# Patient Record
Sex: Female | Born: 1935 | Race: Black or African American | Hispanic: No | Marital: Married | Smoking: Former smoker
Health system: Southern US, Community
[De-identification: ages and names within clinical notes are randomized; demographics above are authoritative.]

## PROBLEM LIST (undated history)

## (undated) DIAGNOSIS — E876 Hypokalemia: Secondary | ICD-10-CM

## (undated) DIAGNOSIS — E785 Hyperlipidemia, unspecified: Secondary | ICD-10-CM

## (undated) DIAGNOSIS — M109 Gout, unspecified: Secondary | ICD-10-CM

## (undated) DIAGNOSIS — G4700 Insomnia, unspecified: Principal | ICD-10-CM

## (undated) DIAGNOSIS — E559 Vitamin D deficiency, unspecified: Secondary | ICD-10-CM

## (undated) DIAGNOSIS — R0981 Nasal congestion: Secondary | ICD-10-CM

## (undated) DIAGNOSIS — R059 Cough, unspecified: Secondary | ICD-10-CM

## (undated) DIAGNOSIS — D649 Anemia, unspecified: Principal | ICD-10-CM

## (undated) DIAGNOSIS — Z923 Personal history of irradiation: Secondary | ICD-10-CM

## (undated) DIAGNOSIS — J019 Acute sinusitis, unspecified: Secondary | ICD-10-CM

## (undated) DIAGNOSIS — I10 Essential (primary) hypertension: Secondary | ICD-10-CM

## (undated) DIAGNOSIS — K120 Recurrent oral aphthae: Secondary | ICD-10-CM

## (undated) DIAGNOSIS — R599 Enlarged lymph nodes, unspecified: Secondary | ICD-10-CM

## (undated) DIAGNOSIS — E079 Disorder of thyroid, unspecified: Secondary | ICD-10-CM

## (undated) DIAGNOSIS — C410 Malignant neoplasm of bones of skull and face: Secondary | ICD-10-CM

## (undated) DIAGNOSIS — C030 Malignant neoplasm of upper gum: Secondary | ICD-10-CM

## (undated) DIAGNOSIS — H409 Unspecified glaucoma: Secondary | ICD-10-CM

## (undated) DIAGNOSIS — R05 Cough: Secondary | ICD-10-CM

## (undated) DIAGNOSIS — H402290 Chronic angle-closure glaucoma, unspecified eye, stage unspecified: Secondary | ICD-10-CM

## (undated) DIAGNOSIS — K5900 Constipation, unspecified: Secondary | ICD-10-CM

## (undated) DIAGNOSIS — C50919 Malignant neoplasm of unspecified site of unspecified female breast: Secondary | ICD-10-CM

## (undated) DIAGNOSIS — E440 Moderate protein-calorie malnutrition: Secondary | ICD-10-CM

## (undated) DIAGNOSIS — D630 Anemia in neoplastic disease: Secondary | ICD-10-CM

## (undated) DIAGNOSIS — M1710 Unilateral primary osteoarthritis, unspecified knee: Secondary | ICD-10-CM

## (undated) DIAGNOSIS — E875 Hyperkalemia: Secondary | ICD-10-CM

## (undated) DIAGNOSIS — B370 Candidal stomatitis: Principal | ICD-10-CM

## (undated) DIAGNOSIS — T8149XA Infection following a procedure, other surgical site, initial encounter: Secondary | ICD-10-CM

## (undated) DIAGNOSIS — E781 Pure hyperglyceridemia: Secondary | ICD-10-CM

## (undated) DIAGNOSIS — C50911 Malignant neoplasm of unspecified site of right female breast: Secondary | ICD-10-CM

## (undated) DIAGNOSIS — Z79811 Long term (current) use of aromatase inhibitors: Secondary | ICD-10-CM

## (undated) DIAGNOSIS — Z931 Gastrostomy status: Secondary | ICD-10-CM

## (undated) DIAGNOSIS — IMO0002 Reserved for concepts with insufficient information to code with codable children: Secondary | ICD-10-CM

## (undated) DIAGNOSIS — R7989 Other specified abnormal findings of blood chemistry: Secondary | ICD-10-CM

## (undated) DIAGNOSIS — J301 Allergic rhinitis due to pollen: Secondary | ICD-10-CM

## (undated) HISTORY — DX: Essential (primary) hypertension: I10

## (undated) HISTORY — DX: Nasal congestion: R09.81

## (undated) HISTORY — DX: Constipation, unspecified: K59.00

## (undated) HISTORY — PX: OTHER SURGICAL HISTORY: SHX169

## (undated) HISTORY — DX: Anemia in neoplastic disease: D63.0

## (undated) HISTORY — DX: Pure hyperglyceridemia: E78.1

## (undated) HISTORY — DX: Enlarged lymph nodes, unspecified: R59.9

## (undated) HISTORY — DX: Chronic angle-closure glaucoma, unspecified eye, stage unspecified: H40.2290

## (undated) HISTORY — DX: Insomnia, unspecified: G47.00

## (undated) HISTORY — DX: Allergic rhinitis due to pollen: J30.1

## (undated) HISTORY — DX: Unilateral primary osteoarthritis, unspecified knee: M17.10

## (undated) HISTORY — DX: Gout, unspecified: M10.9

## (undated) HISTORY — DX: Vitamin D deficiency, unspecified: E55.9

## (undated) HISTORY — DX: Other specified abnormal findings of blood chemistry: R79.89

## (undated) HISTORY — DX: Hypokalemia: E87.6

## (undated) HISTORY — DX: Cough: R05

## (undated) HISTORY — DX: Candidal stomatitis: B37.0

## (undated) HISTORY — DX: Malignant neoplasm of unspecified site of unspecified female breast: C50.919

## (undated) HISTORY — DX: Disorder of thyroid, unspecified: E07.9

## (undated) HISTORY — DX: Hyperlipidemia, unspecified: E78.5

## (undated) HISTORY — DX: Recurrent oral aphthae: K12.0

## (undated) HISTORY — DX: Reserved for concepts with insufficient information to code with codable children: IMO0002

## (undated) HISTORY — DX: Unspecified glaucoma: H40.9

## (undated) HISTORY — DX: Moderate protein-calorie malnutrition: E44.0

## (undated) HISTORY — DX: Acute sinusitis, unspecified: J01.90

## (undated) HISTORY — DX: Hyperkalemia: E87.5

## (undated) HISTORY — DX: Anemia, unspecified: D64.9

## (undated) HISTORY — PX: BREAST SURGERY: SHX581

## (undated) HISTORY — DX: Cough, unspecified: R05.9

---

## 1958-09-20 HISTORY — PX: ABDOMINAL HYSTERECTOMY: SHX81

## 1998-09-18 ENCOUNTER — Other Ambulatory Visit: Admission: RE | Admit: 1998-09-18 | Discharge: 1998-09-18 | Payer: Self-pay | Admitting: Family Medicine

## 1999-12-19 ENCOUNTER — Encounter: Admission: RE | Admit: 1999-12-19 | Discharge: 1999-12-19 | Payer: Self-pay | Admitting: Family Medicine

## 1999-12-19 ENCOUNTER — Encounter: Payer: Self-pay | Admitting: Family Medicine

## 2001-02-03 ENCOUNTER — Other Ambulatory Visit: Admission: RE | Admit: 2001-02-03 | Discharge: 2001-02-03 | Payer: Self-pay | Admitting: Family Medicine

## 2001-06-28 ENCOUNTER — Encounter (INDEPENDENT_AMBULATORY_CARE_PROVIDER_SITE_OTHER): Payer: Self-pay | Admitting: Specialist

## 2001-06-28 ENCOUNTER — Ambulatory Visit (HOSPITAL_COMMUNITY): Admission: RE | Admit: 2001-06-28 | Discharge: 2001-06-28 | Payer: Self-pay | Admitting: Gastroenterology

## 2002-04-24 ENCOUNTER — Encounter: Payer: Self-pay | Admitting: Family Medicine

## 2002-04-24 ENCOUNTER — Encounter: Admission: RE | Admit: 2002-04-24 | Discharge: 2002-04-24 | Payer: Self-pay | Admitting: Family Medicine

## 2002-05-02 ENCOUNTER — Encounter: Admission: RE | Admit: 2002-05-02 | Discharge: 2002-07-31 | Payer: Self-pay | Admitting: Family Medicine

## 2003-01-03 ENCOUNTER — Encounter: Admission: RE | Admit: 2003-01-03 | Discharge: 2003-01-04 | Payer: Self-pay | Admitting: Family Medicine

## 2003-07-04 ENCOUNTER — Other Ambulatory Visit: Admission: RE | Admit: 2003-07-04 | Discharge: 2003-07-04 | Payer: Self-pay | Admitting: Family Medicine

## 2005-06-01 ENCOUNTER — Encounter: Admission: RE | Admit: 2005-06-01 | Discharge: 2005-06-01 | Payer: Self-pay | Admitting: Family Medicine

## 2007-08-05 ENCOUNTER — Encounter: Admission: RE | Admit: 2007-08-05 | Discharge: 2007-08-05 | Payer: Self-pay | Admitting: Family Medicine

## 2009-08-07 ENCOUNTER — Encounter: Admission: RE | Admit: 2009-08-07 | Discharge: 2009-08-07 | Payer: Self-pay | Admitting: Internal Medicine

## 2009-08-19 ENCOUNTER — Encounter: Admission: RE | Admit: 2009-08-19 | Discharge: 2009-08-19 | Payer: Self-pay | Admitting: Internal Medicine

## 2009-08-28 ENCOUNTER — Encounter: Admission: RE | Admit: 2009-08-28 | Discharge: 2009-08-28 | Payer: Self-pay | Admitting: Internal Medicine

## 2009-08-28 DIAGNOSIS — C50911 Malignant neoplasm of unspecified site of right female breast: Secondary | ICD-10-CM

## 2009-08-28 HISTORY — DX: Malignant neoplasm of unspecified site of right female breast: C50.911

## 2009-09-06 ENCOUNTER — Encounter: Admission: RE | Admit: 2009-09-06 | Discharge: 2009-09-06 | Payer: Self-pay | Admitting: Internal Medicine

## 2009-09-10 ENCOUNTER — Encounter: Admission: RE | Admit: 2009-09-10 | Discharge: 2009-09-10 | Payer: Self-pay | Admitting: General Surgery

## 2009-09-11 ENCOUNTER — Ambulatory Visit: Payer: Self-pay | Admitting: Oncology

## 2009-10-09 ENCOUNTER — Ambulatory Visit (HOSPITAL_COMMUNITY): Admission: RE | Admit: 2009-10-09 | Discharge: 2009-10-10 | Payer: Self-pay | Admitting: General Surgery

## 2009-10-09 ENCOUNTER — Encounter (INDEPENDENT_AMBULATORY_CARE_PROVIDER_SITE_OTHER): Payer: Self-pay | Admitting: General Surgery

## 2009-10-09 HISTORY — PX: SIMPLE MASTECTOMY W/ SENTINEL NODE BIOPSY: SHX2410

## 2009-10-21 ENCOUNTER — Ambulatory Visit: Payer: Self-pay | Admitting: Oncology

## 2009-12-26 ENCOUNTER — Ambulatory Visit: Payer: Self-pay | Admitting: Oncology

## 2010-02-09 ENCOUNTER — Encounter: Payer: Self-pay | Admitting: Family Medicine

## 2010-02-09 ENCOUNTER — Encounter: Payer: Self-pay | Admitting: General Surgery

## 2010-02-09 ENCOUNTER — Encounter: Payer: Self-pay | Admitting: Internal Medicine

## 2010-02-10 ENCOUNTER — Encounter: Payer: Self-pay | Admitting: Family Medicine

## 2010-04-03 LAB — CBC
HCT: 37 % (ref 36.0–46.0)
Hemoglobin: 11.8 g/dL — ABNORMAL LOW (ref 12.0–15.0)
MCH: 25.4 pg — ABNORMAL LOW (ref 26.0–34.0)
MCHC: 31.9 g/dL (ref 30.0–36.0)
Platelets: 247 10*3/uL (ref 150–400)
RDW: 17.1 % — ABNORMAL HIGH (ref 11.5–15.5)
WBC: 6.7 10*3/uL (ref 4.0–10.5)

## 2010-04-03 LAB — URINALYSIS, ROUTINE W REFLEX MICROSCOPIC
Bilirubin Urine: NEGATIVE
Ketones, ur: NEGATIVE mg/dL
Protein, ur: 30 mg/dL — AB
Specific Gravity, Urine: 1.013 (ref 1.005–1.030)
Urobilinogen, UA: 0.2 mg/dL (ref 0.0–1.0)
pH: 7 (ref 5.0–8.0)

## 2010-04-03 LAB — COMPREHENSIVE METABOLIC PANEL
ALT: 17 U/L (ref 0–35)
Alkaline Phosphatase: 86 U/L (ref 39–117)
BUN: 6 mg/dL (ref 6–23)
CO2: 29 mEq/L (ref 19–32)
GFR calc Af Amer: >60 mL/min (ref >60)
GFR calc non Af Amer: >60 mL/min (ref >60)
Potassium: 3.8 mEq/L (ref 3.5–5.1)
Sodium: 141 mEq/L (ref 135–145)
Total Bilirubin: 0.7 mg/dL (ref 0.3–1.2)

## 2010-04-03 LAB — DIFFERENTIAL
Lymphocytes Relative: 35 % (ref 12–46)
Lymphs Abs: 2.3 10*3/uL (ref 0.7–4.0)
Neutrophils Relative %: 57 % (ref 43–77)

## 2010-04-03 LAB — URINE MICROSCOPIC-ADD ON

## 2010-04-03 LAB — GLUCOSE, CAPILLARY

## 2010-06-06 NOTE — Procedures (Signed)
. [facility]  Patient:    Kristine Morales, Kristine Morales Visit Number: [id number] MRN: [id number]          Service Type: END Location: ENDO Attending Physician:  Charna Elizabeth Dictated by:   Anselmo Rod, M.D. Proc. Date: 06/28/01 Admit Date:  06/28/2001 Discharge Date: 06/28/2001   CC:         Talmadge Coventry, M.D.   Procedure Report  DATE OF BIRTH:  1931/12/03  REFERRING PHYSICIAN:  Talmadge Coventry, M.D.  PROCEDURE PERFORMED:  Colonoscopy with snare polypectomy x1.  ENDOSCOPIST:  Anselmo Rod, M.D.  INSTRUMENT USED:  Olympus video colonoscope changed to a pediatric scope.  INDICATIONS FOR PROCEDURE:  The patient is a 77 year old African-American female undergoing screening colonoscopy.  The patient has a family history of breast cancer and has had guaiac-positive stools on an physical exam.  PREPROCEDURE PREPARATION:  Informed consent was procured from the patient. The patient was fasted for eight hours prior to the procedure and prepped with a bottle of magnesium citrate and a gallon of NuLytely the night prior to the procedure.  PREPROCEDURE PHYSICAL:  The patient had stable vital signs.  Neck supple. Chest clear to auscultation.  S1, S2 regular.  Abdomen soft with normal bowel sounds.  DESCRIPTION OF PROCEDURE:  The patient was placed in the left lateral decubitus position and sedated with 100 mg of Demerol and 10 mg of Versed intravenously.  Once the patient was adequately sedated and maintained on low-flow oxygen and continuous cardiac monitoring, the Olympus video colonoscope was advanced from the rectum to the cecum with slight difficulty. The scope was looping at about the transverse colon.  Therefore the adult colonoscope was withdrawn and the pediatric colonoscope was used.  The pediatric scope was advanced up to the cecum.  The appendicular orifice and ileocecal valve were clearly visualized and photographed.  A small  polyp was ablated in the midleft colon and a small sessile polyp was snared from 20 cm. No diverticulosis, large masses or polyps were seen.  Small internal hemorrhoids were appreciated on retroflexion in the rectum.  The patient tolerated the procedure well without complications.  IMPRESSION: 1. Small polyp snared at 20 cm. 2. Small polyp ablated at midleft colon. 3. No evidence of diverticulosis. 4. Small nonbleeding internal hemorrhoid.  RECOMMENDATIONS: 1. Await pathology results. 2. Avoid all nonsteroidals for the next two weeks. 3. Outpatient follow-up in the next two weeks for repeat guaiac testing.    Further recommendation made at that time. Dictated by:   Anselmo Rod, M.D. Attending Physician:  Charna Elizabeth DD:  06/28/01 TD:  06/29/01 Job: [id number]

## 2010-08-26 ENCOUNTER — Other Ambulatory Visit: Payer: Self-pay | Admitting: Internal Medicine

## 2010-08-26 DIAGNOSIS — Z1231 Encounter for screening mammogram for malignant neoplasm of breast: Secondary | ICD-10-CM

## 2010-09-03 ENCOUNTER — Ambulatory Visit: Payer: Self-pay

## 2010-09-19 ENCOUNTER — Ambulatory Visit
Admission: RE | Admit: 2010-09-19 | Discharge: 2010-09-19 | Disposition: A | Discharge status: Home / Self Care | Payer: Medicare Other | Source: Ambulatory Visit | Attending: Internal Medicine | Admitting: Internal Medicine

## 2010-09-19 DIAGNOSIS — Z1231 Encounter for screening mammogram for malignant neoplasm of breast: Secondary | ICD-10-CM

## 2010-11-17 ENCOUNTER — Encounter (INDEPENDENT_AMBULATORY_CARE_PROVIDER_SITE_OTHER): Payer: Self-pay | Admitting: General Surgery

## 2010-11-24 ENCOUNTER — Other Ambulatory Visit: Payer: Medicare Other | Admitting: Lab

## 2010-11-26 ENCOUNTER — Telehealth: Payer: Self-pay | Admitting: Oncology

## 2010-11-26 NOTE — Telephone Encounter (Signed)
Pt called and cancelled appt on 11/12.  Pt will call in dec to r/s appt to jan2013

## 2010-12-01 ENCOUNTER — Ambulatory Visit: Payer: Medicare Other | Admitting: Oncology

## 2011-02-09 ENCOUNTER — Ambulatory Visit (HOSPITAL_BASED_OUTPATIENT_CLINIC_OR_DEPARTMENT_OTHER): Payer: Medicare Other | Admitting: Oncology

## 2011-02-09 ENCOUNTER — Other Ambulatory Visit: Payer: Medicare Other | Admitting: Lab

## 2011-02-09 VITALS — BP 227/110 | HR 90 | Temp 98.6°F | Ht 66.5 in | Wt 137.8 lb

## 2011-02-09 DIAGNOSIS — Z170 Estrogen receptor positive status [ER+]: Secondary | ICD-10-CM

## 2011-02-09 DIAGNOSIS — Z79811 Long term (current) use of aromatase inhibitors: Secondary | ICD-10-CM

## 2011-02-09 DIAGNOSIS — C50919 Malignant neoplasm of unspecified site of unspecified female breast: Secondary | ICD-10-CM

## 2011-02-09 DIAGNOSIS — Z9010 Acquired absence of unspecified breast and nipple: Secondary | ICD-10-CM

## 2011-02-09 LAB — CBC WITH DIFFERENTIAL/PLATELET
BASO%: 0.4 % (ref 0.0–2.0)
Basophils Absolute: 0 10*3/uL (ref 0.0–0.1)
EOS%: 0.3 % (ref 0.0–7.0)
HGB: 12.1 g/dL (ref 11.6–15.9)
LYMPH%: 37.4 % (ref 14.0–49.7)
MCH: 26.8 pg (ref 25.1–34.0)
MCV: 81.9 fL (ref 79.5–101.0)
MONO#: 0.5 10*3/uL (ref 0.1–0.9)
MONO%: 6.9 % (ref 0.0–14.0)
NEUT#: 3.7 10*3/uL (ref 1.5–6.5)
Platelets: 261 10*3/uL (ref 145–400)
RBC: 4.51 10*6/uL (ref 3.70–5.45)
RDW: 16.7 % — ABNORMAL HIGH (ref 11.2–14.5)
WBC: 6.7 10*3/uL (ref 3.9–10.3)

## 2011-02-09 LAB — COMPREHENSIVE METABOLIC PANEL
BUN: 9 mg/dL (ref 6–23)
CO2: 27 mEq/L (ref 19–32)
Calcium: 10 mg/dL (ref 8.4–10.5)
Chloride: 103 mEq/L (ref 96–112)
Creatinine, Ser: 0.76 mg/dL (ref 0.50–1.10)
Glucose, Bld: 106 mg/dL — ABNORMAL HIGH (ref 70–99)

## 2011-02-09 NOTE — Progress Notes (Signed)
ID: PRESTON WEILL  DOB: 1931-10-01  MR#: [id number]  CSN#: [id number]  HISTORY OF PRESENT ILLNESS:   Ms. Obey had routine screening mammography on July 20th at [facility] showing a possible mass in the right breast.  She was recalled August 1st for additional views and Dr. Deboraha Sprang confirmed the presence of an ill-defined mass in the right lower outer quadrant posteriorly which was palpable.  Ultrasound showed an ill-defined hypoechoic mass measuring 1 cm.  The axilla was unremarkable.  This was felt to be highly suspicious and biopsy was performed August 28, 2009.  This showed [phone]) an invasive ductal carcinoma, low grade, ER 94% and PR 43% positive, Her-2 negative, with an MIB-1 of 9%.    With this information, the patient was referred to Dr. Derrell Lolling and bilateral breast MRIs were obtained August 19th.  This showed not only the original mass in the lower outer quadrant but a second enhancing mass in the subareolar region.  This was identified by ultrasound on August 23rd but since the patient had already decided on mastectomy, this was not biopsied.    The patient underwent right mastectomy with axillary sentinel lymph node mapping on September 21st.  The final pathology from this procedure ([id number]) showed invasive ductal carcinoma, grade 1, measuring 1.6 cm, with ample margins.  No evidence of angiolymphatic invasion, and a single sentinel lymph node negative for tumor (there were 2 additional intramammary lymph nodes that were negative).    Interval History:  The interval history is chiefly remarkable for her oldest son having died last 10-03-2022. She had diabetes and obesity and was on hemodialysis. Even though he had been declining, this was not entirely expected. She is grieving appropriately.  ROS:  Otherwise she is tolerating the letrozole without any side effects that she is aware of. She is eating a little bit less and has lost 3 pounds. I do think this is secondary to  depression/brief and not the letrozole. 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 A detailed review of systems today was otherwise noncontributory  No Known Allergies  Current Outpatient Prescriptions  Medication Sig Dispense Refill  . aspirin 81 MG tablet Take 160 mg by mouth daily.      Marland Kitchen letrozole (FEMARA) 2.5 MG tablet Take 2.5 mg by mouth daily.      Marland Kitchen lisinopril (PRINIVIL,ZESTRIL) 40 MG tablet Take 40 mg by mouth daily.      Marland Kitchen lovastatin (ALTOPREV) 40 MG 24 hr tablet Take 40 mg by mouth at bedtime.      . potassium chloride (K-DUR,KLOR-CON) 10 MEQ tablet Take 10 mEq by mouth daily.        PAST MEDICAL HISTORY:  Significant for tobacco abuse, the patient smoking about 1/3 of a pack per day, quitting before her recent surgery.  Hypertension, hypercholesterolemia, hypokalemia, and status post hysterectomy with bilateral salpingo-oophorectomy in the '60s secondary to menorrhagia.    FAMILY HISTORY:  The patient's father committed suicide and she knows little about him.  The patient's mother died in her eighties from a stroke and heart disease. The patient has 6 brothers and 3 sisters.  One of her 3 sisters had breast cancer but she does not know how old she was when it was diagnosed.  She learned about the problem when the patient was 78, and her sister died from metastatic breast  cancer at the age of 23.  GYN HISTORY:  She is GX P4.  First pregnancy to term at age 78.  She did not take hormone replacement after her bilateral salpingo-oophorectomy in the '60s.    SOCIAL HISTORY:  She is retired from [employer].  Her husband of 50 years, Richard "[employer] also used to work at [employer] and drove a truck.  Daughter Lajuan Godbee is present today. She is currently unemployed.  Most of the patient's family is in this area.  She has seven grandchildren and two  great-grandchildren.  She attends the [employer].    Objective:  Filed Vitals:   02/09/11 1028  BP: 227/110  Pulse: 90  Temp: 98.6 F (37 C)    BMI: Body mass index is 21.91 kg/(m^2).   ECOG FS: 0  Physical Exam:   Sclerae unicteric  Oropharynx clear  No peripheral adenopathy, in particular the right axilla is clear  Lungs clear -- no rales or rhonchi  Heart regular rate and rhythm  Abdomen benign  MSK no focal spinal tenderness, no peripheral edema  Neuro nonfocal  Breast exam: Status post right mastectomy, no evidence of local recurrence. The left breast is unremarkable  Lab Results:      Chemistry      Component Value Date/Time   NA 141 10/04/2009 1314   K 3.8 10/04/2009 1314   CL 101 10/04/2009 1314   CO2 29 10/04/2009 1314   BUN 6 10/04/2009 1314   CREATININE 0.78 10/04/2009 1314      Component Value Date/Time   CALCIUM 9.4 10/04/2009 1314   ALKPHOS 86 10/04/2009 1314   AST 32 10/04/2009 1314   ALT 17 10/04/2009 1314   BILITOT 0.7 10/04/2009 1314       Lab Results  Component Value Date   WBC 6.7 02/09/2011   HGB 12.1 02/09/2011   HCT 36.9 02/09/2011   MCV 81.9 02/09/2011   PLT 261 02/09/2011   NEUTROABS 3.7 02/09/2011    Studies/Results:  Mammography 09/19/2010 was unremarkable  Assessment: This is a 78- year-old [place] woman status post right mastectomy for a T1c N0 Grade 1 invasive ductal carcinoma, strongly ER and PR positive, Her-2/ negative, with a low proliferation fraction at 9%.  On letrozole since OCT 40976, with good tolerance  Plan: She is doing very well with the letrozole and we're making no changes in her treatment. She had a bone density July of 2011 and we will repeat that August of this year. She will see me at late August to discuss that and her mammogram, and thereafter we will see her on a once a year basis. The plan is to continue letrozole for 5 years  We talked about grief, and I do think she needs to watch your weight loss  and increase her activity level. If she finds that she is falling further declined she will let us know and we will start her on antidepressant, but at this point she really upper first and not to take any other medications.  Tanna Loeffler C 02/09/2011

## 2011-02-24 ENCOUNTER — Encounter (INDEPENDENT_AMBULATORY_CARE_PROVIDER_SITE_OTHER): Payer: Self-pay | Admitting: General Surgery

## 2011-03-10 ENCOUNTER — Encounter (INDEPENDENT_AMBULATORY_CARE_PROVIDER_SITE_OTHER): Payer: Self-pay | Admitting: General Surgery

## 2011-03-10 DIAGNOSIS — I10 Essential (primary) hypertension: Secondary | ICD-10-CM

## 2011-03-10 DIAGNOSIS — E7800 Pure hypercholesterolemia, unspecified: Secondary | ICD-10-CM

## 2011-03-17 ENCOUNTER — Ambulatory Visit (INDEPENDENT_AMBULATORY_CARE_PROVIDER_SITE_OTHER): Payer: Medicare Other | Admitting: General Surgery

## 2011-03-17 ENCOUNTER — Encounter (INDEPENDENT_AMBULATORY_CARE_PROVIDER_SITE_OTHER): Payer: Self-pay | Admitting: General Surgery

## 2011-03-17 VITALS — BP 190/98 | HR 88 | Temp 99.1°F | Resp 20 | Ht 66.5 in | Wt 141.6 lb

## 2011-03-17 DIAGNOSIS — C50919 Malignant neoplasm of unspecified site of unspecified female breast: Secondary | ICD-10-CM

## 2011-03-17 NOTE — Progress Notes (Signed)
Subjective:     Patient ID: Kristine Morales, female   DOB: October 04, 1931, 78 y.o.   MRN: [id number]  HPI This 78 year old woman return to see me in long-term followup from her right breast cancer.  On October 09, 2009 she underwent right total mastectomy and right sentinel node biopsy.  Her pathologic stage is T1c., N0, 1.6 cm invasive ductal carcinoma. One Sentinel node negative, 2 other nodes are negative, receptor positive, Ki-67 9%, HER-2 negative.  She is on adjuvant Femara and being followed by Dr. Darnelle Catalan.  She is doing well. She has no complaints about her left breast or her right mastectomy wound. She says her right arm feels fine she has no numbness or swelling.  Left breast mammogram performed on August 26, 2010 is normal, category one.  Her primary care physician Works for 3 months he near care.  Review of Systems 10 system review neg.    Objective:   Physical Exam Patient looks well. She is in no distress.  Neck no adenopathy, no mass, no jugular distention  Lungs clear to auscultation bilaterally  Heart regular rate and rhythm, no ectopy. Radial pulses palpable  Right mastectomy wound well healed. Skin healthy. No nodules, no ulcerations, no axillary adenopathy. Left breast soft. No mass no adenopathy no skin change.    Assessment:     Invasive ductal carcinoma right breast, 1.6 cm tumor, pathologic stage T1c., N0, receptor positive, HER-2-negative, Ki-67 9%.  No evidence of recurrence 18 months following right total mastectomy and sentinel node biopsy    Plan:     Continue regular followup with Dr. Marikay Alar Magrinat.  Continue Femara  Left breast mammogram in August of 2013  Return to see me in one year.    Angelia Mould. Derrell Lolling, M.D., [facility] General and Minimally invasive Surgery Breast and Colorectal Surgery Office:   [phone] Pager:   [phone]

## 2011-03-17 NOTE — Patient Instructions (Signed)
Your mammograms in August looked fine. Your physical exam today is normal and there is no evidence of cancer.  Be sure to get your left breast mammogram in August of 2013. Keep your  appointment with Dr. Darnelle Catalan.  Return to see me in one year.

## 2011-08-26 ENCOUNTER — Other Ambulatory Visit: Payer: Medicare Other

## 2011-09-02 ENCOUNTER — Other Ambulatory Visit: Payer: Medicare Other | Admitting: Lab

## 2011-09-09 ENCOUNTER — Ambulatory Visit: Payer: Medicare Other | Admitting: Physician Assistant

## 2011-10-08 ENCOUNTER — Other Ambulatory Visit: Payer: Self-pay | Admitting: Internal Medicine

## 2011-10-08 DIAGNOSIS — Z1231 Encounter for screening mammogram for malignant neoplasm of breast: Secondary | ICD-10-CM

## 2011-10-08 DIAGNOSIS — Z9011 Acquired absence of right breast and nipple: Secondary | ICD-10-CM

## 2011-11-06 ENCOUNTER — Ambulatory Visit
Admission: RE | Admit: 2011-11-06 | Discharge: 2011-11-06 | Disposition: A | Discharge status: Home / Self Care | Payer: Medicare Other | Source: Ambulatory Visit | Attending: Internal Medicine | Admitting: Internal Medicine

## 2011-11-06 ENCOUNTER — Ambulatory Visit
Admission: RE | Admit: 2011-11-06 | Discharge: 2011-11-06 | Disposition: A | Discharge status: Home / Self Care | Payer: Medicare Other | Source: Ambulatory Visit | Attending: Oncology | Admitting: Oncology

## 2011-11-06 DIAGNOSIS — Z1231 Encounter for screening mammogram for malignant neoplasm of breast: Secondary | ICD-10-CM

## 2011-11-06 DIAGNOSIS — Z9011 Acquired absence of right breast and nipple: Secondary | ICD-10-CM

## 2011-11-06 DIAGNOSIS — C50919 Malignant neoplasm of unspecified site of unspecified female breast: Secondary | ICD-10-CM

## 2012-02-04 ENCOUNTER — Telehealth: Payer: Self-pay | Admitting: Oncology

## 2012-02-04 NOTE — Telephone Encounter (Signed)
Pt came by and needed to r/s a missed lab and visit from august,ok to r/s per melissa,done and printed for pt    anne

## 2012-02-09 ENCOUNTER — Other Ambulatory Visit (HOSPITAL_BASED_OUTPATIENT_CLINIC_OR_DEPARTMENT_OTHER): Payer: Medicare Other

## 2012-02-09 ENCOUNTER — Telehealth: Payer: Self-pay | Admitting: Oncology

## 2012-02-09 ENCOUNTER — Encounter: Payer: Self-pay | Admitting: Physician Assistant

## 2012-02-09 ENCOUNTER — Other Ambulatory Visit: Payer: Self-pay | Admitting: Physician Assistant

## 2012-02-09 DIAGNOSIS — R591 Generalized enlarged lymph nodes: Secondary | ICD-10-CM

## 2012-02-09 DIAGNOSIS — C50919 Malignant neoplasm of unspecified site of unspecified female breast: Secondary | ICD-10-CM

## 2012-02-09 DIAGNOSIS — R6884 Jaw pain: Secondary | ICD-10-CM

## 2012-02-09 LAB — COMPREHENSIVE METABOLIC PANEL (CC13)
AST: 19 U/L (ref 5–34)
Albumin: 3.4 g/dL — ABNORMAL LOW (ref 3.5–5.0)
Alkaline Phosphatase: 101 U/L (ref 40–150)
BUN: 25 mg/dL (ref 7.0–26.0)
Glucose: 142 mg/dl — ABNORMAL HIGH (ref 70–99)
Potassium: 3.9 mEq/L (ref 3.5–5.1)
Sodium: 139 mEq/L (ref 136–145)
Total Bilirubin: 0.76 mg/dL (ref 0.20–1.20)
Total Protein: 7.7 g/dL (ref 6.4–8.3)

## 2012-02-09 LAB — CBC WITH DIFFERENTIAL/PLATELET
BASO%: 0.5 % (ref 0.0–2.0)
Basophils Absolute: 0 10*3/uL (ref 0.0–0.1)
HCT: 29.2 % — ABNORMAL LOW (ref 34.8–46.6)
HGB: 9.3 g/dL — ABNORMAL LOW (ref 11.6–15.9)
MCHC: 31.8 g/dL (ref 31.5–36.0)
MONO#: 0.5 10*3/uL (ref 0.1–0.9)
NEUT%: 61.2 % (ref 38.4–76.8)
WBC: 6.3 10*3/uL (ref 3.9–10.3)
lymph#: 1.9 10*3/uL (ref 0.9–3.3)

## 2012-02-09 NOTE — Telephone Encounter (Signed)
Pt to f/u w/AB as scheduled. gv pt appt schedule for January and appt w/Dr. Lazarus Salines for 1/24 @ 2:40 pm to arrive 2:10pm

## 2012-02-09 NOTE — Progress Notes (Signed)
Bari was in our office today for labs in preparation of an office visit next week on January 28. She was seen briefly for right cervical adenopathy which she tells me has been there now for approximately 2 months. She is practically edentulous, with no teeth in the right jaw. The jaw itself is painful, and she has also had a sore throat.  A referral has been placed with [facility] for further evaluation and assessment for pharyngeal lesions.  Zollie Scale, PA-C 02/09/2012

## 2012-02-12 ENCOUNTER — Other Ambulatory Visit: Payer: Self-pay | Admitting: Otolaryngology

## 2012-02-12 HISTORY — PX: OTHER SURGICAL HISTORY: SHX169

## 2012-02-16 ENCOUNTER — Telehealth: Payer: Self-pay | Admitting: Oncology

## 2012-02-16 ENCOUNTER — Ambulatory Visit (HOSPITAL_BASED_OUTPATIENT_CLINIC_OR_DEPARTMENT_OTHER): Payer: Medicare Other | Admitting: Physician Assistant

## 2012-02-16 ENCOUNTER — Encounter: Payer: Self-pay | Admitting: Physician Assistant

## 2012-02-16 ENCOUNTER — Other Ambulatory Visit (HOSPITAL_COMMUNITY): Payer: Self-pay | Admitting: Otolaryngology

## 2012-02-16 ENCOUNTER — Other Ambulatory Visit: Payer: Self-pay | Admitting: Otolaryngology

## 2012-02-16 VITALS — BP 204/69 | HR 85 | Temp 97.8°F | Resp 20 | Ht 66.5 in | Wt 135.3 lb

## 2012-02-16 DIAGNOSIS — C069 Malignant neoplasm of mouth, unspecified: Secondary | ICD-10-CM

## 2012-02-16 DIAGNOSIS — C50919 Malignant neoplasm of unspecified site of unspecified female breast: Secondary | ICD-10-CM

## 2012-02-16 DIAGNOSIS — I10 Essential (primary) hypertension: Secondary | ICD-10-CM

## 2012-02-16 DIAGNOSIS — Z1231 Encounter for screening mammogram for malignant neoplasm of breast: Secondary | ICD-10-CM

## 2012-02-16 DIAGNOSIS — C410 Malignant neoplasm of bones of skull and face: Secondary | ICD-10-CM

## 2012-02-16 DIAGNOSIS — M899 Disorder of bone, unspecified: Secondary | ICD-10-CM

## 2012-02-16 DIAGNOSIS — G4700 Insomnia, unspecified: Secondary | ICD-10-CM

## 2012-02-16 DIAGNOSIS — C779 Secondary and unspecified malignant neoplasm of lymph node, unspecified: Secondary | ICD-10-CM

## 2012-02-16 DIAGNOSIS — C50911 Malignant neoplasm of unspecified site of right female breast: Secondary | ICD-10-CM

## 2012-02-16 DIAGNOSIS — M8580 Other specified disorders of bone density and structure, unspecified site: Secondary | ICD-10-CM

## 2012-02-16 DIAGNOSIS — C760 Malignant neoplasm of head, face and neck: Secondary | ICD-10-CM

## 2012-02-16 HISTORY — DX: Malignant neoplasm of bones of skull and face: C41.0

## 2012-02-16 MED ORDER — MIRTAZAPINE 15 MG PO TABS: 15.0000 mg | ORAL_TABLET | Freq: Every day | ORAL | Status: DC

## 2012-02-16 MED ORDER — LETROZOLE 2.5 MG PO TABS: 2.5000 mg | ORAL_TABLET | Freq: Every day | ORAL | Status: DC

## 2012-02-16 NOTE — Telephone Encounter (Signed)
Gevena Mart the desk nurse and she had spoken with Dr. Gaylyn Rong regarding scheduling this pt...he is going to send a msg to [name] in HIM for the new pt slot....and will send a pof at a later time.

## 2012-02-16 NOTE — Telephone Encounter (Signed)
gv and pritned appt schedule for pt for Oct and Jan 2015...SUBJECTIVE:/w KC @ breast center for Oct 22 @ 11:00am..Marland KitchenMarland KitchenLvm on Dr. Gaylyn Rong desk nurse....nurse will notify when to schedule and the pt will be called

## 2012-02-16 NOTE — Progress Notes (Signed)
ID: Kristine Morales   DOB: 24-Dec-1931  MR#: [id number]  NFA#:[id number]  PCP: Provider Not In System GYN:  SU:  OTHER MD:   HISTORY OF PRESENT ILLNESS: Kristine Morales had routine screening mammography on July 20th at [facility] showing a possible mass in the right breast. She was recalled August 1st for additional views and Dr. Deboraha Sprang confirmed the presence of an ill-defined mass in the right lower outer quadrant posteriorly which was palpable. Ultrasound showed an ill-defined hypoechoic mass measuring 1 cm. The axilla was unremarkable. This was felt to be highly suspicious and biopsy was performed August 28, 2009. This showed [phone] an invasive ductal carcinoma, low grade, ER 94% and PR 43% positive, Her-2 negative, with an MIB-1 of 9%.  With this information, the patient was referred to Dr. Derrell Lolling and bilateral breast MRIs were obtained August 19th. This showed not only the original mass in the lower outer quadrant but a second enhancing mass in the subareolar region. This was identified by ultrasound on August 23rd but since the patient had already decided on mastectomy, this was not biopsied.  The patient underwent right mastectomy with axillary sentinel lymph node mapping on September 21st. The final pathology from this procedure ([id number]) showed invasive ductal carcinoma, grade 1, measuring 1.6 cm, with ample margins. No evidence of angiolymphatic invasion, and a single sentinel lymph node negative for tumor (there were 2 additional intramammary lymph nodes that were negative).    INTERVAL HISTORY: Kristine Morales returns today for routine one-year followup of her right breast carcinoma. She continues on letrozole which she is tolerating well. She denies any significant hot flashes. She's had no vaginal bleeding or dryness. She also denies any increased joint pain.  Interval history is remarkable for Kristine Morales having come in last week for labs, and complaining of the jaw pain, throat pain, and  enlarged cervical lymph node. After brief evaluation, I referred her to Dr. Lazarus Salines for further evaluation. Unfortunately, a biopsy of the right maxillary alveolus in 02/12/2012 was positive for invasive squamous cell carcinoma. Kristine Morales is understandably upset about this new diagnosis. She admits that she has not told her family, only her husband. She is anxious, is having a difficult time sleeping, and has no appetitie.  REVIEW OF SYSTEMS: Kristine Morales has had no fevers, chills, or night sweats. She denies any rashes, skin changes, or abnormal bleeding. She's previously having some bleeding of the gums on the right side, but this has resolved. Although she denies any nausea or emesis, she does have a decreased appetite. She has no mechanical problems swallowing. She's having regular bowel movements. She denies any cough, shortness of breath, chest pain, or palpitations. She's had no abnormal headaches, dizziness, or change in vision. She denies any unusual myalgias, arthralgias, bony pain, or peripheral swelling.  A detailed review of systems is otherwise stable and noncontributory.   PAST MEDICAL HISTORY: Past Medical History  Diagnosis Date  . Hypertension   . Hyperlipidemia   . Cancer     Right breast  . Head and neck malignancy 02/16/2012  Significant for tobacco abuse, the patient smoking about 1/3 of a pack per day, quitting before her recent surgery. Hypertension, hypercholesterolemia, hypokalemia, and status post hysterectomy with bilateral salpingo-oophorectomy in the '60s secondary to menorrhagia.    PAST SURGICAL HISTORY: Past Surgical History  Procedure Date  . Abdominal hysterectomy   . Breast surgery     R. mastectomy    FAMILY HISTORY Family History  Problem Relation Age of Onset  .  Heart disease Mother   . Stroke Mother   . Cancer Mother     breast  . Cancer Sister     breast  The patient's father committed suicide and she knows little about him. The patient's mother died in  her eighties from a stroke and heart disease. The patient has 6 brothers and 3 sisters. One of her 3 sisters had breast cancer but she does not know how old she was when it was diagnosed. She learned about the problem when the patient was 68, and her sister died from metastatic breast cancer at the age of 85.   GYNECOLOGIC HISTORY: She is GX P4. First pregnancy to term at age 33. She did not take hormone replacement after her bilateral salpingo-oophorectomy in the '60s.    SOCIAL HISTORY: She is retired from [employer]. Her husband of 50 years, Richard "[employer] also used to work at [employer] and drove a truck. Daughter Angeleah Labrake is present today. She is currently unemployed. Most of the patient's family is in this area. She has seven grandchildren and two great-grandchildren. She attends the [employer].     ADVANCED DIRECTIVES:  HEALTH MAINTENANCE: History  Substance Use Topics  . Smoking status: Former Games developer  . Smokeless tobacco: Not on file  . Alcohol Use: Yes     Comment: socially     Colonoscopy:  PAP:  Bone density:  Oct 2013, osteopenia  Lipid panel:  No Known Allergies  Current Outpatient Prescriptions  Medication Sig Dispense Refill  . aspirin 81 MG tablet Take 160 mg by mouth daily.      Marland Kitchen letrozole (FEMARA) 2.5 MG tablet Take 1 tablet (2.5 mg total) by mouth daily.  30 tablet  11  . lisinopril (PRINIVIL,ZESTRIL) 40 MG tablet Take 40 mg by mouth daily.      Marland Kitchen lovastatin (ALTOPREV) 40 MG 24 hr tablet Take 40 mg by mouth at bedtime.      . potassium chloride (K-DUR,KLOR-CON) 10 MEQ tablet Take 10 mEq by mouth daily.        OBJECTIVE: Elderly African American female who appears slightly anxious, but in no acute distress. Filed Vitals:   02/16/12 0944  BP: 204/69  Pulse: 85  Temp: 97.8 F (36.6 C)  Resp: 20     Body mass index is 21.51 kg/(m^2).    ECOG FS:1 Filed Weights   02/16/12 0944  Weight: 135 lb 4.8 oz (61.372 kg)    Sclerae  unicteric Oropharynx shows no mucositis or candidiasis. Patient is status post biopsy of the right maxillary alveolus, appears to be healing well with no drainage or evidence of bleeding. No supraclavicular adenopathy or left cervical adenopathy. There is however a large palpable lymph node in the right cervical area, slightly tender to palpation.   Lungs no rales or rhonchi Heart regular rate and rhythm Abdome soft, nontender, with positive bowel sounds n  MSK no focal spinal tenderness, no peripheral edema Neuro: nonfocal, well oriented  Breasts: Patient is status post right mastectomy with no evidence of local recurrence the chest wall. Left breast is unremarkable. Axillae are benign bilaterally the palpable adenopathy.   LAB RESULTS: Lab Results  Component Value Date   WBC 6.3 02/09/2012   NEUTROABS 3.9 02/09/2012   HGB 9.3* 02/09/2012   HCT 29.2* 02/09/2012   MCV 80.5 02/09/2012   PLT 264 02/09/2012      Chemistry      Component Value Date/Time   NA 139  02/09/2012 0919   NA 142 02/09/2011 1011   K 3.9 02/09/2012 0919   K 3.8 02/09/2011 1011   CL 104 02/09/2012 0919   CL 103 02/09/2011 1011   CO2 24 02/09/2012 0919   CO2 27 02/09/2011 1011   BUN 25.0 02/09/2012 0919   BUN 9 02/09/2011 1011   CREATININE 1.1 02/09/2012 0919   CREATININE 0.76 02/09/2011 1011      Component Value Date/Time   CALCIUM 10.0 02/09/2012 0919   CALCIUM 10.0 02/09/2011 1011   ALKPHOS 101 02/09/2012 0919   ALKPHOS 80 02/09/2011 1011   AST 19 02/09/2012 0919   AST 35 02/09/2011 1011   ALT 11 02/09/2012 0919   ALT 12 02/09/2011 1011   BILITOT 0.76 02/09/2012 0919   BILITOT 0.7 02/09/2011 1011       Lab Results  Component Value Date   LABCA2 28 02/09/2011     STUDIES:  Most recent bone density in October 2013 showed osteopenia.  Most recent left mammogram in October 2013 was unremarkable.    And ASSESSMENT: 79 y.o.  [place] woman   (1)  status post right mastectomy for a T1c N0 Grade 1 invasive ductal  carcinoma, strongly ER and PR positive, Her-2/ negative, with a low proliferation fraction at 9%.   (2)  On letrozole since OCT 2012, with good tolerance  (3)  Osteopenia  (4)  diagnosis of head and neck malignancy following a biopsy of the right maxillary alveolus in 02/12/2012, positive for invasive squamous cell carcinoma.   PLAN: This case was reviewed with Dr. Darnelle Catalan who also spoke with the patient today. With regards to her breast cancer, Rosea is doing extremely well, and will continue on her letrozole 2.5 mg daily. I have refilled a prescription for another year. She'll be due for her left mammogram in October, and will not be due for another bone density until October of 2015.  She will see Dr. Darnelle Catalan for labs and followup with regards to her breast cancer in 1 year, January 2015.  The biggest concern at this time is her newly diagnosed head and neck cancer, and Dr. Darnelle Catalan has suggested referring Dorien to Dr. Gaylyn Rong for further evaluation as soon as possible.    We're prescribing Remeron at 15 mg nightly to help Malayiah with her insomnia, and hopefully this will also increase her appetite. Ottilie voices understanding and agreement with this plan, and will call with any changes or problems.  Kristine Morales    02/16/2012

## 2012-02-16 NOTE — Progress Notes (Signed)
Received office notes from Dr. Karol Wolicki @ [facility]; forwarded to Dr. Ha. 

## 2012-02-17 ENCOUNTER — Telehealth: Payer: Self-pay | Admitting: Oncology

## 2012-02-17 NOTE — Telephone Encounter (Signed)
S/W PT IN REF TO APPT WITH DR HA ON 02/19/12@4 :30

## 2012-02-19 ENCOUNTER — Inpatient Hospital Stay
Admission: RE | Admit: 2012-02-19 | Discharge: 2012-02-19 | Payer: Medicare Other | Source: Ambulatory Visit | Attending: Otolaryngology | Admitting: Otolaryngology

## 2012-02-19 ENCOUNTER — Inpatient Hospital Stay: Admission: RE | Admit: 2012-02-19 | Payer: Medicare Other | Source: Ambulatory Visit

## 2012-02-19 ENCOUNTER — Ambulatory Visit: Payer: Medicare Other | Admitting: Oncology

## 2012-02-19 NOTE — Progress Notes (Signed)
Pt wanted to reschedule.

## 2012-02-21 ENCOUNTER — Other Ambulatory Visit: Payer: Self-pay | Admitting: Oncology

## 2012-02-22 ENCOUNTER — Encounter: Payer: Self-pay | Admitting: Radiation Oncology

## 2012-02-22 ENCOUNTER — Ambulatory Visit (HOSPITAL_COMMUNITY)
Admission: RE | Admit: 2012-02-22 | Discharge: 2012-02-22 | Disposition: A | Discharge status: Home / Self Care | Payer: Medicare Other | Source: Ambulatory Visit | Attending: Otolaryngology | Admitting: Otolaryngology

## 2012-02-22 DIAGNOSIS — I517 Cardiomegaly: Secondary | ICD-10-CM | POA: Insufficient documentation

## 2012-02-22 DIAGNOSIS — R220 Localized swelling, mass and lump, head: Secondary | ICD-10-CM | POA: Insufficient documentation

## 2012-02-22 DIAGNOSIS — I2510 Atherosclerotic heart disease of native coronary artery without angina pectoris: Secondary | ICD-10-CM | POA: Insufficient documentation

## 2012-02-22 DIAGNOSIS — C779 Secondary and unspecified malignant neoplasm of lymph node, unspecified: Secondary | ICD-10-CM

## 2012-02-22 DIAGNOSIS — I712 Thoracic aortic aneurysm, without rupture, unspecified: Secondary | ICD-10-CM | POA: Insufficient documentation

## 2012-02-22 DIAGNOSIS — C770 Secondary and unspecified malignant neoplasm of lymph nodes of head, face and neck: Secondary | ICD-10-CM | POA: Insufficient documentation

## 2012-02-22 DIAGNOSIS — N9489 Other specified conditions associated with female genital organs and menstrual cycle: Secondary | ICD-10-CM | POA: Insufficient documentation

## 2012-02-22 DIAGNOSIS — C50919 Malignant neoplasm of unspecified site of unspecified female breast: Secondary | ICD-10-CM

## 2012-02-22 MED ORDER — FLUDEOXYGLUCOSE F - 18 (FDG) INJECTION
20.4000 | Freq: Once | INTRAVENOUS | Status: AC | PRN
  Administered 2012-02-22: 20.4 via INTRAVENOUS

## 2012-02-23 ENCOUNTER — Encounter: Payer: Self-pay | Admitting: Radiation Oncology

## 2012-02-23 ENCOUNTER — Ambulatory Visit
Admission: RE | Admit: 2012-02-23 | Discharge: 2012-02-23 | Disposition: A | Discharge status: Home / Self Care | Payer: Medicare Other | Source: Ambulatory Visit | Attending: Radiation Oncology | Admitting: Radiation Oncology

## 2012-02-23 ENCOUNTER — Ambulatory Visit
Admission: RE | Admit: 2012-02-23 | Discharge: 2012-02-23 | Disposition: A | Discharge status: Home / Self Care | Payer: Medicare Other | Source: Ambulatory Visit | Attending: Otolaryngology | Admitting: Otolaryngology

## 2012-02-23 VITALS — BP 168/79 | HR 76 | Temp 98.1°F | Resp 16 | Ht 67.0 in | Wt 143.0 lb

## 2012-02-23 DIAGNOSIS — C069 Malignant neoplasm of mouth, unspecified: Secondary | ICD-10-CM

## 2012-02-23 DIAGNOSIS — Z79899 Other long term (current) drug therapy: Secondary | ICD-10-CM | POA: Insufficient documentation

## 2012-02-23 DIAGNOSIS — C779 Secondary and unspecified malignant neoplasm of lymph node, unspecified: Secondary | ICD-10-CM

## 2012-02-23 DIAGNOSIS — I10 Essential (primary) hypertension: Secondary | ICD-10-CM | POA: Insufficient documentation

## 2012-02-23 DIAGNOSIS — E785 Hyperlipidemia, unspecified: Secondary | ICD-10-CM | POA: Insufficient documentation

## 2012-02-23 DIAGNOSIS — C410 Malignant neoplasm of bones of skull and face: Secondary | ICD-10-CM | POA: Insufficient documentation

## 2012-02-23 DIAGNOSIS — C030 Malignant neoplasm of upper gum: Secondary | ICD-10-CM | POA: Insufficient documentation

## 2012-02-23 DIAGNOSIS — N839 Noninflammatory disorder of ovary, fallopian tube and broad ligament, unspecified: Secondary | ICD-10-CM | POA: Insufficient documentation

## 2012-02-23 DIAGNOSIS — Z853 Personal history of malignant neoplasm of breast: Secondary | ICD-10-CM | POA: Insufficient documentation

## 2012-02-23 HISTORY — DX: Long term (current) use of aromatase inhibitors: Z79.811

## 2012-02-23 HISTORY — DX: Malignant neoplasm of unspecified site of right female breast: C50.911

## 2012-02-23 HISTORY — DX: Malignant neoplasm of bones of skull and face: C41.0

## 2012-02-23 MED ORDER — IOHEXOL 300 MG/ML  SOLN
75.0000 mL | Freq: Once | INTRAMUSCULAR | Status: AC | PRN
  Administered 2012-02-23: 75 mL via INTRAVENOUS

## 2012-02-23 NOTE — Progress Notes (Signed)
New Consult Right Maxillary Alveolus= Soft Tissue Biopsy=Invasive Squamous cell Carcinoma,   History Right Breast Cancer right lower outer quadrant Bx 08/28/09,ER/PR=+,Her2 neu=Neg.Mastectomy Continues on letrozole   Married, Retired from [employer], ,4 children,   Allergies:NKDA   No history Radiation No history Pacemaker

## 2012-02-23 NOTE — Progress Notes (Signed)
Pt denies pain but states she occasionally has pain in right lower jaw. She does not take any pain med for this. She reports loss of appetite, previous weight 150 lbs. Pt states she "is just lazy" when asked if she is fatigued.

## 2012-02-23 NOTE — Progress Notes (Signed)
Complete PATIENT MEASURE OF DISTRESS worksheet with a score of 9 submitted to social work.  

## 2012-02-23 NOTE — Progress Notes (Signed)
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 female who reports that she was in her usual state of health until she noticed that her gums were bleeding when she brushed them. She later developed a "knot" on the side of her right jaw and some soreness in the upper and lower grams on the right side of her mouth. She has lost about 8 pounds. She reports that her dentures have been fitting normally. She quit using cigarettes about one month ago had been cutting down for about 2-3 years. She had smoked one half pack per day for about 6 years. She denies any history as chewing tobacco. She drinks less than one drink per day. Past medical history is notable for a history of right breast cancer which was treated with mastectomy and letrozole, diagnosed 2-1/2 years ago.  The patient was referred to otolaryngology. On 02/12/2012, she underwent biopsy which revealed: Soft tissue, biopsy, right maxillary alveolus - INVASIVE SQUAMOUS CELL CARCINOMA, SEE COMMENT.  Yesterday, her PET scan revealed "Mass within the right upper maxilla is intensely hypermetabolic and is concerning for primary neoplasm. Hypermetabolic lymph node metastasis to the right level II lymph node chain." There are other incidental findings as described below including a large ovarian mass which we discussed today as we reviewed her imaging together.  Today, a CT scan of the maxillofacial and neck regions demonstrates a soft tissue mass along the alveolar ridge of the right maxilla with bony destruction. There is a necrotic submandibular lymph node on the right which measures 16 x 12 mm  compatible with metastatic disease. There is an additional lesion to the right submandibular gland which could be a lymph node or accessory submandibular lobe.  The patient is here with her husband today. She is withdrawn and appears depressed. She reports hesitation to go to her appointment tomorrow at [facility] to discuss surgery with one of the otolaryngologists. Dr. Lazarus Salines felt that their expertise would be valuable for her as she will likely require significant reconstruction.  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    PAST SURGICAL HISTORY: Past Surgical History  Procedure Date  . Simple mastectomy w/ sentinel node biopsy 9/21/20112    Right Breast  . Soft tissue biopsy, right maxillary alveolus 02/12/2012    Invasive Squamous Cell  . Abdominal hysterectomy 1960's    BSO, bleeding  . Cataract surgery     bilat    FAMILY HISTORY: family history includes Cancer in her mother and sister; Heart attack in her father and sister; Heart disease in her mother; and Stroke in her mother.  SOCIAL HISTORY:  reports that she quit smoking about 3 years ago. Her smoking use included Cigarettes. She smoked .5 packs per day. She does not have any smokeless tobacco history on file. She reports that she drinks alcohol. She reports that she does not use illicit drugs.  ALLERGIES: Review of patient's allergies indicates no known allergies.  MEDICATIONS:  Current  Outpatient Prescriptions  Medication Sig Dispense Refill  . ALPHAGAN P 0.1 % SOLN       . aspirin 81 MG tablet Take 160 mg by mouth daily.      . dorzolamide-timolol (COSOPT) 22.3-6.8 MG/ML ophthalmic solution       . letrozole (FEMARA) 2.5 MG tablet Take 1 tablet (2.5 mg total) by mouth daily.  30 tablet  11  . lisinopril  (PRINIVIL,ZESTRIL) 40 MG tablet Take 40 mg by mouth daily.      Marland Kitchen lovastatin (ALTOPREV) 40 MG 24 hr tablet Take 40 mg by mouth at bedtime.      . mirtazapine (REMERON) 15 MG tablet Take 1 tablet (15 mg total) by mouth at bedtime.  30 tablet  6  . potassium chloride (K-DUR,KLOR-CON) 10 MEQ tablet Take 10 mEq by mouth daily.        REVIEW OF SYSTEMS: as above   PHYSICAL EXAM:  height is 5\' 7"  (1.702 m) and weight is 143 lb (64.864 kg). Her oral temperature is 98.1 F (36.7 C). Her blood pressure is 168/79 and her pulse is 76. Her respiration is 16 and oxygen saturation is 100%.   General: Alert and oriented, blunted affect HEENT: Head is normocephalic. Recent eye surgery at the. Extraocular movements are intact. Dentures removed from maxillary gum. Oropharynx is notable for a raised lesion arising from the right maxillary alveolar ridge - approximately 3-1/2 cm in dimension. It is erythematous with patches of white tissue. She has several frontal teeth along the mandibular gum in fair condition Neck: Notable for a palpable lymph node, approximately one and a half to 2 cm, in the right level IB area  Heart: Regular in rate and rhythm with no murmurs, rubs, or gallops. Chest: Clear to auscultation bilaterally, with no rhonchi, wheezes, or rales. Abdomen: Soft, nontender, nondistended, with no rigidity or guarding. Extremities: No cyanosis or edema. Lymphatics: As above  Skin: No concerning lesions. Musculoskeletal: symmetric strength and muscle tone throughout. Neurologic: Cranial nerves II through XII are grossly intact. No obvious focalities. Speech is fluent. Coordination is intact. Psychiatric: She appears depressed   LABORATORY DATA:  Lab Results  Component Value Date   WBC 6.3 02/09/2012   HGB 9.3* 02/09/2012   HCT 29.2* 02/09/2012   MCV 80.5 02/09/2012   PLT 264 02/09/2012   CMP     Component Value Date/Time   NA 139 02/09/2012 0919   NA 142 02/09/2011 1011   K 3.9 02/09/2012 0919    K 3.8 02/09/2011 1011   CL 104 02/09/2012 0919   CL 103 02/09/2011 1011   CO2 24 02/09/2012 0919   CO2 27 02/09/2011 1011   GLUCOSE 142* 02/09/2012 0919   GLUCOSE 106* 02/09/2011 1011   BUN 25.0 02/09/2012 0919   BUN 9 02/09/2011 1011   CREATININE 1.1 02/09/2012 0919   CREATININE 0.76 02/09/2011 1011   CALCIUM 10.0 02/09/2012 0919   CALCIUM 10.0 02/09/2011 1011   PROT 7.7 02/09/2012 0919   PROT 7.4 02/09/2011 1011   ALBUMIN 3.4* 02/09/2012 0919   ALBUMIN 4.4 02/09/2011 1011   AST 19 02/09/2012 0919   AST 35 02/09/2011 1011   ALT 11 02/09/2012 0919   ALT 12 02/09/2011 1011   ALKPHOS 101 02/09/2012 0919   ALKPHOS 80 02/09/2011 1011   BILITOT 0.76 02/09/2012 0919   BILITOT 0.7 02/09/2011 1011   GFRNONAA >60 10/04/2009 1314   GFRAA  Value: >60        The eGFR has been  calculated using the MDRD equation. This calculation has not been validated in all clinical situations. eGFR's persistently <60 mL/min signify possible Chronic Kidney Disease. 10/04/2009 1314         RADIOGRAPHY: As above     IMPRESSION/PLAN: This is a lovely breast cancer survivor with squamous cell carcinoma of the right maxillary alveolar ridge. Stage T4a N1 M0.   Plan is as below:  1) she has a consultation pending tomorrow at [facility] otolaryngology. I have reached out to the folks at [facility] to call her and increase the chance that she will show for this important appointment. Had a lengthy discussion with the patient and her husband -she appears reluctant, but I think she will hopefully attend this consultation.  2) Will refer to social work for social support / evaluation of emotional needs, she appears depressed. Patient defers other services such as contact with our chaplain  3) I spoke with the patient and her husband about radiotherapy. The understand that radiotherapy alone cannot cure her disease. I hope that she will pursue surgery; I would like to start radiotherapy within 6 weeks of surgery for beast results,  based on the medical literature (Ang et all, IJROBP 2001).  I asked the patient to call me at the end of this week regarding her disposition with the otolaryngologist at [facility].  4) Further referrals to swallowing therapy, nutrition, dentistry, and/or interventional radiology for PEG tube placement will be placed on hold depending on her disposition at [facility]  5) Work up of ovarian mass to be held until more acute issues addressed. I will discuss this with med/ onc.  We discussed the risks, benefits, and side effects of radiotherapy. She understands adjuvant radiotherapy will give her the best chance of cure and local regional control.  We talked in moderate detail about acute and late effects.  This information will be reviewed at her next visit. We also had a lengthy discussion about how she coped with her first cancer diagnosis, and sources of strength that allow her to deal with adversity. I know this will be a difficult journey for her. I gave the patient and her husband my contact information for questions that may arise in the future.    I spent 45 minutes minutes face to face with the patient and more than 50% of that time was spent in counseling and/or coordination of care.   __________________________________________   Lonie Peak, MD

## 2012-02-24 ENCOUNTER — Other Ambulatory Visit: Payer: Self-pay | Admitting: Radiation Oncology

## 2012-02-24 ENCOUNTER — Telehealth: Payer: Self-pay | Admitting: Radiation Oncology

## 2012-02-24 DIAGNOSIS — C030 Malignant neoplasm of upper gum: Secondary | ICD-10-CM

## 2012-02-24 NOTE — Telephone Encounter (Signed)
Faxed Dr. Colletta Maryland 02/23/12 consult to Dr. Hezzie Bump, [phone].  Received confirmation.

## 2012-02-25 ENCOUNTER — Other Ambulatory Visit: Payer: Self-pay | Admitting: Oncology

## 2012-02-26 NOTE — Addendum Note (Signed)
Encounter addended by: Trejuan Matherne Mintz Johneisha Broaden, RN on: 02/26/2012  6:00 PM<BR>     Documentation filed: Charges VN

## 2012-03-01 ENCOUNTER — Other Ambulatory Visit: Payer: Self-pay | Admitting: Radiation Oncology

## 2012-03-01 DIAGNOSIS — R1900 Intra-abdominal and pelvic swelling, mass and lump, unspecified site: Secondary | ICD-10-CM

## 2012-03-01 NOTE — Progress Notes (Signed)
The patient was reviewed at her head and neck tumor board. Since then, she has also been seen at [facility] with plans for surgery at the end of this month with Dr. Hezzie Bump. The consensus at [facility] tumor board was that the patient should be seen by gynecologic oncology for further evaluation of the incidental pelvic mass that was found on a PET CT scan. I called the patient to let her know that a referral will be made to gynecologic oncology for this. I left a message at gynecologic oncology regarding the patient's complicated circumstances.  -----------------------------------  Lonie Peak, MD

## 2012-03-02 ENCOUNTER — Telehealth: Payer: Self-pay | Admitting: *Deleted

## 2012-03-02 NOTE — Telephone Encounter (Signed)
Called patient to inform of appt. With Dr. Nelly Rout on March 6, arrival time - 10:00 am, lvm for a return call

## 2012-03-08 ENCOUNTER — Telehealth: Payer: Self-pay | Admitting: Dietician

## 2012-03-08 NOTE — Telephone Encounter (Signed)
Nutrition Note  Contacted patient due to patient on malnutrition risk report.  Patient not available.  Spoke with patient's husband.  Husband states that she is not eating a lot "but never did".  RD discussed importance of nutrition.  Recommended 5-6 small meals per day and trial of Ensure or Boost.   UBW:  150 lbs.  137.5 lbs one year ago, 135 lbs 1/28 and 143 lbs 2/4.  Appetite decreased per chart.  Mailed a handout on maximizing nutrition and coupons for Ensure along with [facility]'s contact information.   Husband stated need for patient to eat more.  Oran Rein, RD, LDN

## 2012-03-14 ENCOUNTER — Other Ambulatory Visit: Payer: Self-pay | Admitting: Oncology

## 2012-03-15 DIAGNOSIS — I10 Essential (primary) hypertension: Secondary | ICD-10-CM | POA: Insufficient documentation

## 2012-03-17 ENCOUNTER — Encounter (INDEPENDENT_AMBULATORY_CARE_PROVIDER_SITE_OTHER): Payer: Self-pay | Admitting: General Surgery

## 2012-03-17 ENCOUNTER — Ambulatory Visit (INDEPENDENT_AMBULATORY_CARE_PROVIDER_SITE_OTHER): Payer: Medicare Other | Admitting: General Surgery

## 2012-03-17 VITALS — BP 140/82 | HR 66 | Temp 98.0°F | Resp 18 | Ht 67.0 in | Wt 137.0 lb

## 2012-03-17 DIAGNOSIS — C50919 Malignant neoplasm of unspecified site of unspecified female breast: Secondary | ICD-10-CM

## 2012-03-17 DIAGNOSIS — C50911 Malignant neoplasm of unspecified site of right female breast: Secondary | ICD-10-CM

## 2012-03-17 NOTE — Progress Notes (Signed)
Patient ID: Kristine Morales, female   DOB: April 15, 1931, 79 y.o.   MRN: [id number] History: This patient returns for long-term followup regarding her right breast cancer. On 10/09/2009 she underwent right total mastectomy and sentinel lead biopsy. She had a 1.6 cm invasive ductal carcinoma, receptor positive, HER-2-negative. Pathologic stage TI C., N0. There is no known recurrence today. Left breast mammogram on 11/09/2011 looks fine, category 1. New problem is head and neck cancer, biopsy-proven squamous cell carcinoma of the maxillary alveolus.She has seen Dr. Lazarus Salines and Dr. Gaylyn Rong here in town.  She is scheduled for radical surgery at [facility] tomorrow.  Exam: Patient looks well. Friendly as usual. Palpable mass just below right mandible. No supraclavicular adenopathy. Lungs clear to auscultation bilaterally Heart regular rate and rhythm no murmur no ectopy Breasts: Right mastectomy wound well healed. No palpable nodules or ulceration. No axillary adenopathy. Range of motion right shoulder. No arm swelling. No arm numbness. Left breast slightly atrophic, no palpable mass. No skin change. No axillary adenopathy  ROS: 10 system review of systems is negative except as described above  Assessment: Invasive ductal carcinoma right breast, receptor positive, HER-2-negative, pathologic stage TI C., N0. No evidence of recurrence 2-1/2 years following right total mastectomy and sentinel node biopsy New diagnoses, squamous cell carcinoma, right side head and neck Hypertension  Plan: Proceed with resection of squamous cell carcinoma of right head and neck cancer at [facility] tomorrow Continue letrozole Continued followup Dr. Darnelle Catalan Left breast mammogram in October 2014 Return to see me in one year.   Angelia Mould. Derrell Lolling, M.D., [facility] General and Minimally invasive Surgery Breast and Colorectal Surgery Office:   [phone] Pager:    [phone]

## 2012-03-17 NOTE — Patient Instructions (Signed)
Examination of your right mastectomy wound and your left breast and all of the lymph nodes are normal. Your  mammograms in October were normal. There is no evidence of breast cancer.  Be sure to go ahead with the surgery for the tumor near your right jaw.  Be sure to get your mammograms in October of this year.  Return to see Dr. Derrell Lolling in one year.

## 2012-03-18 DIAGNOSIS — C069 Malignant neoplasm of mouth, unspecified: Secondary | ICD-10-CM | POA: Insufficient documentation

## 2012-03-18 HISTORY — PX: OTHER SURGICAL HISTORY: SHX169

## 2012-03-19 DIAGNOSIS — J9600 Acute respiratory failure, unspecified whether with hypoxia or hypercapnia: Secondary | ICD-10-CM | POA: Insufficient documentation

## 2012-03-24 ENCOUNTER — Telehealth: Payer: Self-pay | Admitting: *Deleted

## 2012-03-24 ENCOUNTER — Ambulatory Visit: Payer: Medicare Other | Admitting: Gynecologic Oncology

## 2012-03-24 NOTE — Telephone Encounter (Signed)
A call was placed to pt  because pt hadn't arrived for her scheduled appointment today. Pt's husband stated "My wife is in the hospital at [facility], she had surgery on Friday for mouth cancer". Mr. Marcus said that they were unaware of the appointment with GYN Oncology. The office number was given so that Mrs Bagby can contact the office once she is home and recovered for a new appointment.

## 2012-03-25 ENCOUNTER — Telehealth: Payer: Self-pay | Admitting: *Deleted

## 2012-03-25 NOTE — Telephone Encounter (Signed)
CALLED PATIENT TO INFORM OF VISIT WITH DR. Nelly Rout ON 04-14-12 - ARRIVAL TIME - 11:30 AM , NO ANSWER , MAILED APPT. CARD

## 2012-04-05 ENCOUNTER — Other Ambulatory Visit: Payer: Self-pay | Admitting: *Deleted

## 2012-04-05 MED ORDER — COLCHICINE 0.6 MG PO TABS: ORAL_TABLET | ORAL | Status: DC

## 2012-04-14 ENCOUNTER — Other Ambulatory Visit: Payer: Self-pay | Admitting: *Deleted

## 2012-04-14 ENCOUNTER — Telehealth: Payer: Self-pay | Admitting: Oncology

## 2012-04-14 ENCOUNTER — Ambulatory Visit: Payer: Medicare Other | Admitting: Gynecologic Oncology

## 2012-04-14 DIAGNOSIS — T8149XA Infection following a procedure, other surgical site, initial encounter: Secondary | ICD-10-CM

## 2012-04-14 HISTORY — DX: Infection following a procedure, other surgical site, initial encounter: T81.49XA

## 2012-04-14 NOTE — Telephone Encounter (Signed)
Unable to contact pt for appt.

## 2012-04-17 ENCOUNTER — Other Ambulatory Visit: Payer: Self-pay | Admitting: Oncology

## 2012-04-18 ENCOUNTER — Encounter: Payer: Self-pay | Admitting: Radiation Oncology

## 2012-04-18 ENCOUNTER — Telehealth: Payer: Self-pay | Admitting: *Deleted

## 2012-04-18 NOTE — Telephone Encounter (Signed)
Patient is taking Colcry 0.6mg  Patient was taking 2 tablets every 3 hours and still having swelling in legs and feet and hurts. Scheduled patient an appointment with Dr. Glade Lloyd for tomorrow 04/19/2012.

## 2012-04-19 ENCOUNTER — Ambulatory Visit
Admission: RE | Admit: 2012-04-19 | Discharge: 2012-04-19 | Disposition: A | Discharge status: Home / Self Care | Payer: Medicare Other | Source: Ambulatory Visit | Attending: Radiation Oncology | Admitting: Radiation Oncology

## 2012-04-19 ENCOUNTER — Telehealth: Payer: Self-pay | Admitting: *Deleted

## 2012-04-19 ENCOUNTER — Encounter: Payer: Self-pay | Admitting: Radiation Oncology

## 2012-04-19 ENCOUNTER — Ambulatory Visit (INDEPENDENT_AMBULATORY_CARE_PROVIDER_SITE_OTHER): Payer: Medicare Other | Admitting: Internal Medicine

## 2012-04-19 VITALS — BP 128/62 | HR 42 | Temp 99.3°F | Resp 12 | Wt 134.0 lb

## 2012-04-19 VITALS — BP 148/76 | HR 81 | Temp 98.1°F | Ht 66.0 in | Wt 134.1 lb

## 2012-04-19 DIAGNOSIS — C050 Malignant neoplasm of hard palate: Secondary | ICD-10-CM | POA: Insufficient documentation

## 2012-04-19 DIAGNOSIS — M25579 Pain in unspecified ankle and joints of unspecified foot: Secondary | ICD-10-CM

## 2012-04-19 DIAGNOSIS — R209 Unspecified disturbances of skin sensation: Secondary | ICD-10-CM | POA: Insufficient documentation

## 2012-04-19 DIAGNOSIS — M25572 Pain in left ankle and joints of left foot: Secondary | ICD-10-CM

## 2012-04-19 DIAGNOSIS — C030 Malignant neoplasm of upper gum: Secondary | ICD-10-CM

## 2012-04-19 DIAGNOSIS — L03119 Cellulitis of unspecified part of limb: Secondary | ICD-10-CM

## 2012-04-19 DIAGNOSIS — M109 Gout, unspecified: Secondary | ICD-10-CM

## 2012-04-19 DIAGNOSIS — L02419 Cutaneous abscess of limb, unspecified: Secondary | ICD-10-CM

## 2012-04-19 HISTORY — DX: Malignant neoplasm of upper gum: C03.0

## 2012-04-19 HISTORY — DX: Gastrostomy status: Z93.1

## 2012-04-19 HISTORY — DX: Infection following a procedure, other surgical site, initial encounter: T81.49XA

## 2012-04-19 MED ORDER — PREDNISONE 50 MG PO TABS: 50.0000 mg | ORAL_TABLET | Freq: Every day | ORAL | Status: AC

## 2012-04-19 MED ORDER — CEPHALEXIN 500 MG PO CAPS: 500.0000 mg | ORAL_CAPSULE | Freq: Two times a day (BID) | ORAL | Status: AC

## 2012-04-19 NOTE — Progress Notes (Signed)
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  On 2.28.14 she underwent infrastructure maxillectomy right neck dissection and flap reconstruction (thigh and forearm) at [facility] with Dr Hezzie Bump.   She did have 1 positive node at right 1b and positive margins. +PNI, +LVSI.    She is recovering reasonably well.  She still appears depressed and does not speak much. It appears she did not show for a GYN Onc   Appointment in the end of March for a pelvic mass incidentally found on her PET.  Presently she has a small open area on the underside of her right mandible. She reports that it "drains a little bit", pink in color. She cleans the area with NS but does not apply any ointment. She reports numbness in her right face and right lip regions. She is currently eating soft foods, but denies any difficulty swallowing soft foods. She admits to Dysphagia with her pills. She appears fatigued today.   She needs f/u with medical oncology.  Pathology report showed:  RECEIVED: 03/18/2012 ORDERING PHYSICIAN: JOSHUA Deanne Coffer , MD PATIENT NAME: Morales, Kristine SURGICAL PATHOLOGY REPORT  FINAL PATHOLOGIC DIAGNOSIS MICROSCOPIC EXAMINATION AND DIAGNOSIS  A. LYMPH NODES AND SUBMANDIBULAR GLAND, RIGHT LEVEL NUMBER 1B, LYMPHADENECTOMY: One of two lymph nodes positive for metastatic squamous cell carcinoma, (1/2). Salivary gland with no diagnostic abnormality.  B. LYMPH NODE LEVEL 1A, BIOPSY: One lymph node negative for malignancy, (0/1).  C. LYMPH NODES, RIGHT NECK CONTENTS LEVELS 2A, 3, 4, LYMPHADENCTOMY: Five lymph nodes negative for malignancy, (0/5).  D. LYMPH  NODES RIGHT LEVEL 2B NECK CONTENTS. LYMPHADENECTOMY: Three lymph nodes negative for malignancy, (0/3).  E. RIGHT MAXILLARY SINUS MUCOSA, EXCISION: Positive for squamous cell carcinoma (see comment).  Comment: Immunoperoxidase studies show the tumor to be positive for p63 and high molecular weight cytokeratin.  The positive immunohistochemical controls worked appropriately.   "These tests were developed and their performance characteristics determined by [facility], [facility]. They have not been cleared or approved by the U.S. Food and Drug Administration. The FDA has determined that such clearance or approval is not necessary. These tests are used for clinical purposes. They should not be regarded as investigational or for research. This laboratory is certified under the Clinical Laboratory Improvement Amendments of 1988 (CLIA) as qualified to perform high complexity clinical laboratory testing."  F. RIGHT MAXILLA, PARTIAL MAXILLECTOMY: Invasive moderately differentiated squamous cell carcinoma. - Tumor size: 5 cm in greatest gross dimension. - Perineural invasion: Positive. - Lymphovascular invasion: Present. - Margins: Focally positive (deep, midsection of lesion). - Final pathologic stage: pT4a pN1. - See comment and tumor protocol.  Comment: Focal margin positivity is seen in F5 which corresponds to the deep/midsection of the lesion. Representative tumor is seen in F5.    LIP AND ORAL CANCER PROTOCOL  SPECIMEN: Maxilla RECEIVED: Fresh PROCEDURE: Resection Maxillectomy: right Neck (lymph node) dissection (specify): right level 1A, 1B, 2A, 3, 4 SPECIMEN SIZE: Greatest dimensions: 8.1 cm SPECIMEN LATERALITY: Right TUMOR SITE: Maxilla TUMOR FOCALITY: Single focus TUMOR SIZE: Greatest dimension: 5.0 HISTOLOGIC TYPE: Squamous cell carcinoma, conventional HISTOLOGIC GRADE: G2 MARGINS: Margins involved by invasive  carcinoma Margin(s), per orientation: Midline/deep F5 LYMPH-VASCULAR INVASION: Present PERINEURAL INVASION: Present LYMPH NODES, EXTRANODAL EXTENSION: Not identified PATHOLOGIC STAGING (pTNM) (Note I) PRIMARY TUMOR (pT) Excluding Mucosal Malig Melanoma: pT4a REG LYMPH NODES (pN) Excluding Mucosal Malig Melanoma: pN1 Number examined: 11 Number involved: 1 DISTANT METS (pM) Excluding Mucosal Malig Melanoma: Not Applicable                  ALLERGIES:  has No Known Allergies.  Meds: Current Outpatient Prescriptions  Medication Sig Dispense Refill  . ALPHAGAN P 0.1 % SOLN       . aspirin 81 MG tablet Take 160 mg by mouth daily.      . colchicine 0.6 MG tablet Take two tablets by mouth now then one tablet in 1 hours then take one tablet up to twice daily for gout  60 tablet  5  . dorzolamide-timolol (COSOPT) 22.3-6.8 MG/ML ophthalmic solution       . hydrochlorothiazide (HYDRODIURIL) 50 MG tablet       . HYDROcodone-acetaminophen (NORCO/VICODIN) 5-325 MG per tablet 1 tablet. Take 1 tablet by mouth every 4 (four) hours as needed for Pain.      Marland Kitchen letrozole (FEMARA) 2.5 MG tablet Take 1 tablet (2.5 mg total) by mouth daily.  30 tablet  11  . lisinopril (PRINIVIL,ZESTRIL) 40 MG tablet Take 40 mg by mouth daily.      Marland Kitchen lovastatin (ALTOPREV) 40 MG 24 hr tablet Take 40 mg by mouth at bedtime.      . mirtazapine (REMERON) 15 MG tablet Take 1 tablet (15 mg total) by mouth at bedtime.  30 tablet  6  . potassium chloride (K-DUR,KLOR-CON) 10 MEQ tablet Take 10 mEq by mouth daily.      Cliffton Asters Petrolatum (PETROLATUM WHITE) OINT Apply to thigh daily       No current facility-administered medications for this encounter.    Physical Findings: The patient is in no acute distress. Patient is alert and oriented.  height is 5\' 6"  (1.676 m) and weight is 134 lb 1.6 oz (60.827 kg). Her temperature is 98.1 F (36.7 C). Her blood pressure is 148/76 and her pulse is 81. Her oxygen saturation is 100%. .   Graft healing well in right hard palate.  She has several frontal mandibular teeth only. Post operative edema/ changes in right neck.  Lab Findings: Lab Results  Component Value Date   WBC 6.3 02/09/2012   HGB 9.3* 02/09/2012   HCT 29.2* 02/09/2012   MCV 80.5 02/09/2012   PLT 264 02/09/2012    CMP     Component Value Date/Time   NA 139 02/09/2012 0919   NA 142 02/09/2011 1011   K 3.9 02/09/2012 0919   K 3.8 02/09/2011 1011   CL 104 02/09/2012 0919   CL 103 02/09/2011 1011   CO2 24 02/09/2012 0919   CO2 27 02/09/2011 1011   GLUCOSE 142* 02/09/2012 0919   GLUCOSE 106* 02/09/2011 1011   BUN 25.0 02/09/2012 0919   BUN 9 02/09/2011 1011   CREATININE 1.1 02/09/2012 0919   CREATININE 0.76 02/09/2011 1011   CALCIUM 10.0 02/09/2012 0919   CALCIUM 10.0 02/09/2011 1011   PROT 7.7 02/09/2012 0919   PROT 7.4 02/09/2011 1011   ALBUMIN 3.4* 02/09/2012 0919   ALBUMIN 4.4 02/09/2011 1011   AST 19 02/09/2012 0919   AST 35 02/09/2011 1011   ALT 11 02/09/2012 0919   ALT 12 02/09/2011 1011   ALKPHOS 101 02/09/2012 0919  ALKPHOS 80 02/09/2011 1011   BILITOT 0.76 02/09/2012 0919   BILITOT 0.7 02/09/2011 1011   GFRNONAA >60 10/04/2009 1314   GFRAA  Value: >60        The eGFR has been calculated using the MDRD equation. This calculation has not been validated in all clinical situations. eGFR's persistently <60 mL/min signify possible Chronic Kidney Disease. 10/04/2009 1314     Radiographic Findings: No results found.  Impression/Plan:    1) Head and Neck Cancer Status: post operative, risk factors for local regional recurrence as above  2) Nutritional Status: - PEG tube: Recommended due to mucositis/risk of malnourishment, dehydration during ChRT.  Pt amenable to PEG placement. Will refer to IR.  3) Risk Factors: The patient has been educated about risk factors including alcohol and tobacco abuse; they understand that avoidance of alcohol and tobacco is important to prevent recurrences as well as other cancers  4)  Swallowing: Will refer to Verdie Mosher, therapist  5) Dental: Will Refer to Dr. Kristin Bruins.  Pt is already 5 weeks post op with high risk of recurrence.  I do not this she has time to wait for extractions.  Request addressing acute issues only with hygiene education.  We are simming her tomorrow, regardless, so as not to delay RT.  6) Depression - referral to Social Work for [employer].  Patient also needs help with compliance - has missed some appointments in past.  7) Social: No active social issues to address at this time  8) Refer back to med/onc for chemotherapy consideration  9) I recommend total dose of 66 Gy in 33 fractions to her surgical bed with margin and will also treat her  bilateral neck to decrease risk of local regional recurrence. We discussed the risks, benefits, and side effects of radiotherapy. We talked in detail about acute and late effects. She understands that some of the most bothersome acute effects will be significant soreness of the throat, changes in taste, changes in salivary function, and fatigue. We talked about late effects which include but are not necessarily limited to tooth decay, jaw injury, dysphagia, hypothyroidism, dry mouth, trismus, and neck edema. No guarantees of treatment were given. A consent form was signed and placed in the patient's medical record. The patient is enthusiastic about proceeding with treatment. I look forward to participating in the patient's care.  The patient was offered enrollment on our single institutional trial investigating open-faced vs close-face head/shoulder masks used to immobilize patients during head and neck radiotherapy. The patient has NOT elected to enroll on this trial.    I spent 30 minutes minutes face to face with the patient and more than 50% of that time was spent in counseling and/or coordination of care. _____________________________________   Lonie Peak, MD

## 2012-04-19 NOTE — Progress Notes (Signed)
Ms. Braaten here following surgery  By Dr. Hezzie Bump.  Presently she has small open area on the underside of her right mandible.  She reports that it "drains a little bit", pink in color.  She cleans the area with NS but does not apply any ointment.  She reports numbness in her right face and right lip regions.  She is currently eating soft foods, but denies any difficulty swallowing soft foods.  She admits to Dysphagia with her pills.  She appears fatigued today.

## 2012-04-19 NOTE — Progress Notes (Signed)
  Subjective:    Patient ID: Kristine Morales, female    DOB: [birth year], 79 y.o.   MRN: [id number]  Chief Complaint  Patient presents with  . Gout    pain and swelling in feet    HPI Pt here with complaints of swelling and pain with redness in her left foot for few weeks. It is worsening and she wants this addressed. Denies any other joint involvement. No fever or chills. Appetite is fair. Recently underwent surgery for SCC. No trauma to foot reported   Review of Systems  Constitutional: Negative for fever and chills.  Respiratory: Negative for shortness of breath.   Cardiovascular: Negative for chest pain and palpitations.  Neurological: Negative for dizziness, weakness and numbness.  Psychiatric/Behavioral: Negative for behavioral problems.       Objective:   Physical Exam  Constitutional: She is oriented to person, place, and time. She appears well-developed.  HENT:  Head: Normocephalic.  Eyes: Pupils are equal, round, and reactive to light.  Neck: Normal range of motion.  Cardiovascular: Normal rate and regular rhythm.   Pulmonary/Chest: Effort normal and breath sounds normal.  Musculoskeletal: Normal range of motion.  Flexion at ankle joint and great toe in left foot produces pain. Erythema and warmth in left foot present. No skin breakdown  Neurological: She is alert and oriented to person, place, and time.  Skin: Skin is warm. There is erythema.  Psychiatric: She has a normal mood and affect.   BP 128/62  Pulse 42  Temp(Src) 99.3 F (37.4 C) (Oral)  Resp 12  Wt 134 lb (60.782 kg)  BMI 21.64 kg/m2  SpO2 95%     Assessment & Plan:   Gout attack Recent stress from surgery could have flared it. Provides hx of gout in past but not on any prophylaxis medication. Has completed colchicine without any help. Will have her on prednisone 50 mg daily for 5 days and reassess. Pt to notify if worsening of symptoms. To check uric acid in 2 weeks time and if elevated will have  him on uric acid lowering agent. List of food to avoid for now provided to pt. May consider changing hctz to another hypertensive  Cellulitis and abscess of leg, except foot With more than 1 joint involved and involvement of soft tissue as well, will have her on keflex 500 mg bid for 5 days for cellulitis treatment and reassess.

## 2012-04-19 NOTE — Assessment & Plan Note (Addendum)
Recent stress from surgery could have flared it. Provides hx of gout in past but not on any prophylaxis medication. Has completed colchicine without any help. Will have her on prednisone 50 mg daily for 5 days and reassess. Pt to notify if worsening of symptoms. To check uric acid in 2 weeks time and if elevated will have him on uric acid lowering agent. List of food to avoid for now provided to pt. May consider changing hctz to another hypertensive

## 2012-04-19 NOTE — Assessment & Plan Note (Signed)
With more than 1 joint involved and involvement of soft tissue as well, will have her on keflex 500 mg bid for 5 days for cellulitis treatment and reassess.

## 2012-04-19 NOTE — Telephone Encounter (Signed)
Inetta Fermo from Interventional Radiology called with scheduled appt for patient  For peg placement To be in Radiology  04/26/12 @ 0930 am, then around 1230 pm to be in short stay , have patient to come to radiology to pick up barium and instruction tomorrow after ct sim  04/20/12 2:47 PM

## 2012-04-19 NOTE — Progress Notes (Signed)
Skin Graft taken from her left anterior thigh and she states this area is "sore".

## 2012-04-20 ENCOUNTER — Ambulatory Visit
Admission: RE | Admit: 2012-04-20 | Discharge: 2012-04-20 | Disposition: A | Discharge status: Home / Self Care | Payer: Medicare Other | Source: Ambulatory Visit | Attending: Radiation Oncology | Admitting: Radiation Oncology

## 2012-04-20 ENCOUNTER — Telehealth: Payer: Self-pay | Admitting: *Deleted

## 2012-04-20 VITALS — BP 134/56 | HR 95 | Temp 98.7°F

## 2012-04-20 DIAGNOSIS — J029 Acute pharyngitis, unspecified: Secondary | ICD-10-CM | POA: Insufficient documentation

## 2012-04-20 DIAGNOSIS — K121 Other forms of stomatitis: Secondary | ICD-10-CM | POA: Insufficient documentation

## 2012-04-20 DIAGNOSIS — C030 Malignant neoplasm of upper gum: Secondary | ICD-10-CM

## 2012-04-20 DIAGNOSIS — R634 Abnormal weight loss: Secondary | ICD-10-CM | POA: Insufficient documentation

## 2012-04-20 DIAGNOSIS — C050 Malignant neoplasm of hard palate: Secondary | ICD-10-CM | POA: Insufficient documentation

## 2012-04-20 DIAGNOSIS — R1310 Dysphagia, unspecified: Secondary | ICD-10-CM | POA: Insufficient documentation

## 2012-04-20 DIAGNOSIS — L988 Other specified disorders of the skin and subcutaneous tissue: Secondary | ICD-10-CM | POA: Insufficient documentation

## 2012-04-20 DIAGNOSIS — C410 Malignant neoplasm of bones of skull and face: Secondary | ICD-10-CM | POA: Insufficient documentation

## 2012-04-20 DIAGNOSIS — Z931 Gastrostomy status: Secondary | ICD-10-CM | POA: Insufficient documentation

## 2012-04-20 DIAGNOSIS — K1370 Unspecified lesions of oral mucosa: Secondary | ICD-10-CM | POA: Insufficient documentation

## 2012-04-20 DIAGNOSIS — Z9119 Patient's noncompliance with other medical treatment and regimen: Secondary | ICD-10-CM | POA: Insufficient documentation

## 2012-04-20 DIAGNOSIS — Y842 Radiological procedure and radiotherapy as the cause of abnormal reaction of the patient, or of later complication, without mention of misadventure at the time of the procedure: Secondary | ICD-10-CM | POA: Insufficient documentation

## 2012-04-20 DIAGNOSIS — Z510 Encounter for antineoplastic radiation therapy: Secondary | ICD-10-CM | POA: Insufficient documentation

## 2012-04-20 DIAGNOSIS — Z91199 Patient's noncompliance with other medical treatment and regimen due to unspecified reason: Secondary | ICD-10-CM | POA: Insufficient documentation

## 2012-04-20 DIAGNOSIS — R6884 Jaw pain: Secondary | ICD-10-CM | POA: Insufficient documentation

## 2012-04-20 DIAGNOSIS — H6120 Impacted cerumen, unspecified ear: Secondary | ICD-10-CM | POA: Insufficient documentation

## 2012-04-20 DIAGNOSIS — Z79899 Other long term (current) drug therapy: Secondary | ICD-10-CM | POA: Insufficient documentation

## 2012-04-20 MED ORDER — SODIUM CHLORIDE 0.9 % IJ SOLN
10.0000 mL | Freq: Once | INTRAMUSCULAR | Status: AC
  Administered 2012-04-20: 10 mL via INTRAVENOUS

## 2012-04-20 MED ORDER — SODIUM CHLORIDE 0.9 % IJ SOLN
10.0000 mL | Freq: Once | INTRAMUSCULAR | Status: AC
Start: 2012-04-20 — End: 2012-04-20
  Administered 2012-04-20: 10 mL via INTRAVENOUS

## 2012-04-20 NOTE — Addendum Note (Signed)
Encounter addended by: Delynn Flavin, RN on: 04/20/2012 12:27 PM<BR>     Documentation filed: Charges VN

## 2012-04-20 NOTE — Telephone Encounter (Signed)
CALLED PATIENT TO INFORM OF APPT. WITH SPEECH THERAPY ON 05-02-12 AT 1:00PM WITH Verdie Mosher  [facility].

## 2012-04-20 NOTE — Progress Notes (Signed)
[name] here today for simulation.  She will receive Constrast during simulation.  Her last  BUN And Creat on 03/27/12 were 13 and 0.56 respectively at [facility].  An I v was established in her right antecubital.  Documentation completed in [facility] section of the Doc flowshett.    When approached by Dental Medicine to make an appointment. For tomorrow to see DR. Kristin Bruins, [name] said; " I can't do this.  I have been here twice this week already and this is too much.  I just need to give up."  Realizing that she was overwhelmed Dental Medicaine made the appointment for 04/25/12.  Reassured [name] that we would not push her to do anything she does not want to do and explained  that our goal is to try to prevent any further cancer.  Acknowledged that this is an overwhelming process and that she should communicate when she is overwhelmed and if she has a difficult time tolerating treatment.  Informed her of the cancer center support services: Chaplain, SW, Psychologist which she declined today.  Spoke with her son about this interaction and he stated that the family as a whole feels she is "depressed" and are making sure that someone is attending to her needs at all times.  Honestly informed him that radiation to the head and neck area can cause mouth and throat pain, and irrritation of skin and that this may be very difficult for [name] to tolerate and that she will need a lot of encouragement and support.

## 2012-04-20 NOTE — Progress Notes (Signed)
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 An Aquaplast head and shoulder mask was custom fitted to the patient's anatomy. High-resolution CT axial imaging was obtained of the head and neck with contrast. I verified that the quality of the imaging is good for treatment planning. 1 Medically Necessary Treatment Device was fabricated and supervised by me: Aquaplast mask.  Treatment planning note I plan to treat the patient with helical Tomotherapy, IMRT. I plan to treat the patient's tumor bed and bilateral neck nodes. I plan to treat to a total dose of 66 Gray in 33  fractions   IMRT planning Note  IMRT is an important modality to deliver adequate dose to the patient's at risk tissues while sparing the patient's normal structures, including the: esophagus, parotid tissue, mandible, brain stem, spinal cord, oral cavity, brachial plexus.  This justifies the use of IMRT in the patient's treatment.   Special Treatment Procedure Note:  The patient will be receiving chemotherapy concurrently. Chemotherapy heightens the risk of side effects. I have considered this during the patient's treatment planning process and will monitor the patient accordingly for side effects on a weekly basis. Concurrent chemotherapy increases the complexity of this patient's treatment and therefore this constitutes a special treatment procedure.  -----------------------------------  Lonie Peak, MD

## 2012-04-20 NOTE — Addendum Note (Signed)
Encounter addended by: Delynn Flavin, RN on: 04/20/2012  9:28 AM<BR>     Documentation filed: Charges VN

## 2012-04-22 ENCOUNTER — Ambulatory Visit: Payer: Medicare Other | Admitting: Nutrition

## 2012-04-22 ENCOUNTER — Other Ambulatory Visit: Payer: Self-pay | Admitting: Radiology

## 2012-04-22 NOTE — Progress Notes (Signed)
This patient is a 79 year old female diagnosed with cancer of the maxillary alveolar ridge, hard palate. She is status post neck dissection February 28.  Past medical history includes breast cancer diagnosed 2011, hypertension, hyperlipidemia, and gout.  Medications include K-Dur, prednisone, Femara.  Labs were reviewed.  Height: 66 inches. Weight: 134 pounds April 1. Usual body weight: 143 pounds February 4. BMI: 21.64.  Estimated nutrition needs: 1825-2130 calories, 91-104 g protein, 2 L fluid.  Patient presents to nutrition consult with her daughter. She states she is lactose intolerant. She experiences some diarrhea when she drinks ensure. She states boost is better. She has a good appetite but is experiencing taste alterations. She is scheduled for a PEG tube placement on April 8.  Nutrition diagnosis: Food and nutrition related knowledge deficit related to new diagnosis of cancer of the maxillary alveolar ridge and associated side effects as evidenced by no prior need for nutrition related information.  Intervention: I've educated patient on the importance of small frequent meals utilizing higher calorie, higher protein foods. I have encouraged her to modify textures and temperature of foods for improved tolerance. I've recommended patient consume 2 oral nutrition supplements daily. I provided her with some samples of boost, ensure, and clear liquid supplements. I briefly educated her on strategies for tube feeding during treatment. I provided fact sheets and my contact information and I've answered her questions. Teach back method used.  Monitoring, evaluation, goals: Patient will tolerate adequate calories and protein by mouth and via tube to minimize weight loss throughout treatment.  Next visit: I will follow patient weekly once treatment schedule defined.

## 2012-04-25 ENCOUNTER — Other Ambulatory Visit: Payer: Self-pay | Admitting: Radiology

## 2012-04-25 ENCOUNTER — Ambulatory Visit (HOSPITAL_COMMUNITY): Payer: Self-pay | Admitting: Dentistry

## 2012-04-25 ENCOUNTER — Ambulatory Visit: Payer: Self-pay | Admitting: Internal Medicine

## 2012-04-25 ENCOUNTER — Encounter (HOSPITAL_COMMUNITY): Payer: Self-pay | Admitting: Dentistry

## 2012-04-25 VITALS — BP 160/73 | HR 86 | Temp 97.7°F

## 2012-04-25 DIAGNOSIS — R252 Cramp and spasm: Secondary | ICD-10-CM

## 2012-04-25 DIAGNOSIS — K0530 Chronic periodontitis, unspecified: Secondary | ICD-10-CM

## 2012-04-25 DIAGNOSIS — K0889 Other specified disorders of teeth and supporting structures: Secondary | ICD-10-CM

## 2012-04-25 DIAGNOSIS — M278 Other specified diseases of jaws: Secondary | ICD-10-CM

## 2012-04-25 DIAGNOSIS — M2607 Excessive tuberosity of jaw: Secondary | ICD-10-CM

## 2012-04-25 DIAGNOSIS — Z0189 Encounter for other specified special examinations: Secondary | ICD-10-CM

## 2012-04-25 DIAGNOSIS — K08109 Complete loss of teeth, unspecified cause, unspecified class: Secondary | ICD-10-CM

## 2012-04-25 DIAGNOSIS — C030 Malignant neoplasm of upper gum: Secondary | ICD-10-CM

## 2012-04-25 DIAGNOSIS — K036 Deposits [accretions] on teeth: Secondary | ICD-10-CM

## 2012-04-25 MED ORDER — SODIUM FLUORIDE 1.1 % DT GEL: DENTAL | Status: DC

## 2012-04-25 NOTE — Progress Notes (Signed)
DENTAL CONSULTATION  Date of Consultation:  04/25/2012 Patient Name:   Kristine Morales Date of Birth:   May 02, 1931 Medical Record Number: [id number]  VITALS: BP 160/73  Pulse 86  Temp(Src) 97.7 F (36.5 C) (Oral)   CHIEF COMPLAINT: Patient was referred for a medically necessary preradiation therapy dental evaluation.  HPI: Kristine Morales is a 79 year old female referred by Dr. Lonie Peak for a dental consultation. 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 Patient underwent surgical resection with constructive surgery to the upper right maxilla on 03/18/2012 by Dr. Hezzie Bump at [facility]. Patient with anticipated postoperative radiation therapy with Dr. Basilio Cairo. Patient is now seen as part of a medically necessary pre-radiation therapy dental evaluation.  The patient currently denies acute toothache, swellings, or abscesses. Patient indicates that she was last seen by dentist 3-4 years ago to have an upper complete and lower partial denture fabricated by [facility]. Patient indicates that prior to the cancer surgery, the upper denture and lower partial denture " fit fine".  Patient has been unable to wear the dentures since the surgery. Patient indicates that she has no regular primary dentist. Patient last dental cleaning was at least 3-4 years ago by report.  Patient Active Problem List  Diagnosis  . Breast cancer  . Hypertension  . Hypercholesteremia  . Head and neck malignancy  . Cancer of maxilla (superior) bone  . Breast cancer, right breast  . Hyperlipidemia  . 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 Gout attack  . Cellulitis and abscess of leg, except foot    PMH: Past Medical History  Diagnosis Date  . Hypertension   . Hyperlipidemia   . Breast cancer, right breast 08/28/2009  . Use of letrozole (Femara) Since October 2012  . 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  Gout, unspecified   . 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 G tube feedings   . Wound infection after surgery 04/14/12    Opened and Packed - Wet to Dry  . Glaucoma     PSH: Past Surgical History  Procedure Laterality Date  . Simple mastectomy w/ sentinel node biopsy Right 10/09/2009    Breast  . Soft tissue biopsy, right maxillary alveolus  02/12/2012    Invasive Squamous Cell  . Abdominal hysterectomy  1960's    BSO, bleeding  . Cataract surgery      bilat  . Tracheotomy and left selective neck dissection Left 03/18/12     [facility]: Left Selective Neck Dissection/Left Infrastructure Maxillectomy, Palatoplasty, Flap Fasciocutaneous Free Flap Reconstruction of Left Maxillary Defect. Split Thickness Skin Graft of Left Forearm Defect Measuring 10/14/cm  . Dobhoff tube placement  03/18/12    ALLERGIES: No Known Allergies  MEDICATIONS: Current Outpatient Prescriptions  Medication Sig Dispense Refill  . ALPHAGAN P 0.1 % SOLN       . aspirin 81 MG tablet Take 160 mg by mouth daily.      . dorzolamide-timolol (COSOPT) 22.3-6.8 MG/ML ophthalmic solution       . hydrochlorothiazide (HYDRODIURIL) 50 MG tablet       . letrozole (FEMARA) 2.5 MG tablet Take 1 tablet (2.5 mg total) by mouth daily.  30 tablet  11  . lisinopril (PRINIVIL,ZESTRIL) 40 MG tablet Take 40 mg by mouth daily.      Marland Kitchen lovastatin (ALTOPREV) 40 MG  24 hr tablet Take 40 mg by mouth at bedtime.      Cliffton Asters Petrolatum (PETROLATUM WHITE) OINT Apply to thigh daily      . HYDROcodone-acetaminophen (NORCO/VICODIN) 5-325 MG per tablet 1 tablet. Take 1 tablet by mouth every 4 (four) hours as needed for Pain.      . potassium chloride (K-DUR,KLOR-CON) 10 MEQ tablet Take 10 mEq by mouth daily.       No current facility-administered medications for this visit.    LABS: Lab Results  Component Value Date   WBC 6.3 02/09/2012   HGB 9.3* 02/09/2012   HCT 29.2*  02/09/2012   MCV 80.5 02/09/2012   PLT 264 02/09/2012      Component Value Date/Time   NA 139 02/09/2012 0919   NA 142 02/09/2011 1011   K 3.9 02/09/2012 0919   K 3.8 02/09/2011 1011   CL 104 02/09/2012 0919   CL 103 02/09/2011 1011   CO2 24 02/09/2012 0919   CO2 27 02/09/2011 1011   GLUCOSE 142* 02/09/2012 0919   GLUCOSE 106* 02/09/2011 1011   BUN 25.0 02/09/2012 0919   BUN 9 02/09/2011 1011   CREATININE 1.1 02/09/2012 0919   CREATININE 0.76 02/09/2011 1011   CALCIUM 10.0 02/09/2012 0919   CALCIUM 10.0 02/09/2011 1011   GFRNONAA >60 10/04/2009 1314   GFRAA  Value: >60        The eGFR has been calculated using the MDRD equation. This calculation has not been validated in all clinical situations. eGFR's persistently <60 mL/min signify possible Chronic Kidney Disease. 10/04/2009 1314   No results found for this basename: INR, PROTIME   No results found for this basename: PTT    SOCIAL HISTORY: History   Social History  . Marital Status: Married    Spouse Name: N/A    Number of Children: N/A  . Years of Education: N/A   Occupational History  . Not on file.   Social History Main Topics  . Smoking status: Former Smoker -- 0.50 packs/day for 4.5 years    Types: Cigarettes    Quit date: 02/22/2009  . Smokeless tobacco: Never Used     Comment: for unknown # yrs  . Alcohol Use: Yes     Comment: Socially- "less than one drink per day"  . Drug Use: No  . Sexually Active: Not on file   Other Topics Concern  . Not on file   Social History Narrative  . No narrative on file    FAMILY HISTORY: Family History  Problem Relation Age of Onset  . Heart disease Mother     CAD  . Stroke Mother   . Cancer Mother     Breast  . Cancer Sister     breast  . Heart attack Father   . Heart attack Sister   . Heart disease Sister   . Heart attack Brother      REVIEW OF SYSTEMS: Reviewed with the patient and included in the dental consultation records.  DENTAL HISTORY: CHIEF  COMPLAINT: Patient was referred for a medically necessary preradiation therapy dental evaluation.  HPI: Kristine Morales is a 79 year old female referred by Dr. Lonie Peak for a dental consultation. 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 Patient underwent surgical resection with constructive surgery to the upper right maxilla on 03/18/2012 by Dr. Hezzie Bump at [facility]. Patient with anticipated postoperative radiation therapy with Dr. Basilio Cairo. Patient is now seen as part of  a medically necessary pre-radiation therapy dental evaluation.  The patient currently denies acute toothache, swellings, or abscesses. Patient indicates that she was last seen by dentist 3-4 years ago to have an upper complete and lower partial denture fabricated by [facility]. Patient indicates that prior to the cancer surgery, the upper denture and lower partial denture " fit fine".  Patient has been unable to wear the dentures since the surgery. Patient indicates that she has no regular primary dentist. Patient last dental cleaning was at least 3-4 years ago by report.  DENTAL EXAMINATION:  GENERAL: The patient is a well-developed, well-nourished female in no acute distress.  HEAD AND NECK: The right neck is consistent with previous neck dissection. The anterior neck is consistent with previous tracheostomy. The upper lip has a scar consistent with previous surgery.  The patient has decreased maximum interincisal opening currently measured at 25 mm from the edentulous maxillary alveolar ridge to the incisal edge of tooth #24/25.  Trismus device was fabricated at 25 mm utilizing 15 tongue depressors. INTRAORAL EXAM: The patient has incipient xerostomia. The patient has flabby tisseus associated with the premaxilla. The patient has an excessive maxillary left tuberosity and buccal exostosis in the area of #16. The upper right maxilla, soft palate, and retromolar trigone area  is consistent with previous surgical resection and reconstruction with a flap. DENTITION: The patient is missing all teeth with the exception of tooth numbers 22, 23, 24, 25, 26, and 27. PERIODONTAL: The patient has periodontitis with plaque and calculus accumulations, selective areas of gingival recession, and no significant tooth mobility. DENTAL CARIES/SUBOPTIMAL RESTORATIONS: There are no dental caries noted.  ENDODONTIC:  Patient denies acute pulpitis symptoms. There is no evidence of periapical pathology. CROWN AND BRIDGE: There are no crown or bridge restorations. PROSTHODONTIC: The patient has an upper complete and lower partial dentures. Patient did not bring the dentures with her today, however. Patient indicated that previously they "fit fine". Patient currently is unable to wear the upper complete and lower partial dentures.  OCCLUSION: Patient with a poor occlusal scheme.   RADIOGRAPHIC INTERPRETATION: An orthopantogram was obtained and supplemented with 3 lower periapical radiographs. The patient is missing all teeth with the exception of tooth numbers 22, 23, 24, 25, 26, and 27. There is incipient bone loss noted.  The maxillary right area is consistent with previous surgical resection.    ASSESSMENTS: 1. Cancer of the upper right maxilla status post surgical resection with flap reconstruction  2. Preradiation therapy dental protocol  3. Chronic periodontitis with incipient bone loss  4. Accretions 5. Selective areas of gingival recession 6. Multiple missing teeth 7. History of ill fitting maxillary complete and lower partial dentures 8. Poor occlusal scheme 9. Incipient xerostomia 10. Flabby tissues associated with the premaxilla 11. Maxillary left excessive tuberosity  12. Buccal exostosis in the area of tooth #16 13. Trismus with a maximum interincisal opening measured at 25 mm   PLAN/RECOMMENDATIONS: 1. I discussed the risks, benefits, and complications of various  treatment options with the patient in relationship to her medical and dental conditions, and anticipated radiation therapy, and radiation therapy side effects to include xerostomia, exercise, taste changes, gum and jawbone changes, and risk for infection and osteoradionecrosis. We discussed various treatment options to include no treatment, multiple extractions with alveoloplasty, pre-prosthetic surgery as indicated, periodontal therapy, dental restorations, root canal therapy, crown and bridge therapy, implant therapy, and replacement of missing teeth as indicated. The patient currently does NOT wish to proceed with  any dental extractions or pre-prosthetic surgery of the upper left tuberosity, lateral exostoses, or premaxilla. The patient, however, does wish to proceed with impression for the fabrication of the lower fluoride tray along with dental cleaning. Patient will then followup with a prosthodontist for evaluation for fabrication of an upper denture and lower cast partial denture as indicated 3 months after the radiation therapy has been completed.   2. Discussion of findings with medical team and coordination of future medical and dental care.   Charlynne Pander, DDS

## 2012-04-25 NOTE — Patient Instructions (Signed)

## 2012-04-26 ENCOUNTER — Ambulatory Visit (HOSPITAL_COMMUNITY)
Admission: RE | Admit: 2012-04-26 | Discharge: 2012-04-26 | Disposition: A | Discharge status: Home / Self Care | Payer: Medicare Other | Source: Ambulatory Visit | Attending: Radiation Oncology | Admitting: Radiation Oncology

## 2012-04-26 ENCOUNTER — Encounter (HOSPITAL_COMMUNITY): Payer: Self-pay

## 2012-04-26 ENCOUNTER — Encounter (HOSPITAL_COMMUNITY): Payer: Self-pay | Admitting: Pharmacy Technician

## 2012-04-26 DIAGNOSIS — C030 Malignant neoplasm of upper gum: Secondary | ICD-10-CM

## 2012-04-26 DIAGNOSIS — C760 Malignant neoplasm of head, face and neck: Secondary | ICD-10-CM | POA: Insufficient documentation

## 2012-04-26 LAB — CBC
HCT: 28.2 % — ABNORMAL LOW (ref 36.0–46.0)
RBC: 3.6 MIL/uL — ABNORMAL LOW (ref 3.87–5.11)
RDW: 17.9 % — ABNORMAL HIGH (ref 11.5–15.5)
WBC: 6.4 10*3/uL (ref 4.0–10.5)

## 2012-04-26 LAB — PROTIME-INR: INR: 0.94 (ref 0.00–1.49)

## 2012-04-26 LAB — APTT: aPTT: 26 seconds (ref 24–37)

## 2012-04-26 MED ORDER — SODIUM CHLORIDE 0.9 % IV SOLN
INTRAVENOUS | Status: DC
  Administered 2012-04-26: 10:00:00 via INTRAVENOUS

## 2012-04-26 MED ORDER — CEFAZOLIN SODIUM 1-5 GM-% IV SOLN: 1.0000 g | INTRAVENOUS | Status: DC

## 2012-04-27 ENCOUNTER — Other Ambulatory Visit: Payer: Self-pay | Admitting: Radiology

## 2012-04-27 ENCOUNTER — Encounter (HOSPITAL_COMMUNITY): Payer: Self-pay | Admitting: Dentistry

## 2012-04-27 ENCOUNTER — Ambulatory Visit (HOSPITAL_COMMUNITY): Payer: Self-pay | Admitting: Dentistry

## 2012-04-27 VITALS — BP 137/86 | HR 83 | Temp 98.3°F

## 2012-04-27 DIAGNOSIS — K0530 Chronic periodontitis, unspecified: Secondary | ICD-10-CM

## 2012-04-27 DIAGNOSIS — Z463 Encounter for fitting and adjustment of dental prosthetic device: Secondary | ICD-10-CM

## 2012-04-27 DIAGNOSIS — Z0189 Encounter for other specified special examinations: Secondary | ICD-10-CM

## 2012-04-27 DIAGNOSIS — C030 Malignant neoplasm of upper gum: Secondary | ICD-10-CM

## 2012-04-27 DIAGNOSIS — K036 Deposits [accretions] on teeth: Secondary | ICD-10-CM

## 2012-04-27 NOTE — Progress Notes (Signed)
04/27/2012  Patient:            Kristine Morales Date of Birth:  [birth year] MRN:                [id number]  TERRIONNA BRIDWELL presents for a dental cleaning and insertion of a lower fluoride tray prior to start of radiation therapy.  Procedure: D1110 -adult prophylaxis. KaVo sonic scaler utilized to remove accretions. A series of hand curettes were utilized refine removal of accretions. Teeth were polished with proceed pace. Floss.             Oral hygiene instructions provided on brushing and flossing during radiation therapy. N5621- lower fluoride tray was inserted and adjusted as needed.               Patient was provided written and verbal instruction on use and care of the fluoride tray.            All questions were answered. Patient to return to clinic for periodic oral examination in approximately 2 weeks during radiation therapy. Patient to call if questions or problems arise before then. She is cleared for radiation therapy.   Charlynne Pander, DDS

## 2012-04-27 NOTE — Patient Instructions (Addendum)
FLUORIDE TRAYS PATIENT INSTRUCTIONS    Obtain prescription from the pharmacy.  Don't be surprised if it needs to be ordered.   Be sure to let the pharmacy know when you are close to needing a new refill for them to have it ready for you without interruption of Fluoride use.   The best time to use your Fluoride is before bed time.   You must brush your teeth very well and floss before using the Fluoride in order to get the best use out of the Fluoride treatments.   Place 1 drop of Fluoride gel per tooth in the tray.   Place the tray on your lower teeth and/or your upper teeth.  Make sure the trays are seated all the way.  Remember, they only fit one way on your teeth.   Insert for 5 full minutes.   At the end of the 5 minutes, take the trays out.  SPIT OUT excess. .    Do NOT rinse your mouth!    Do NOT eat or drink after treatments for at least 30 minutes.  This is why the best time for your treatments is before bedtime.    Clean the inside of your Fluoride trays using COLD WATER and a toothbrush.    In order to keep your Trays from discoloring and free from odors, soak them overnight in denture cleaners such as Efferdent.  Do not use bleach or non denture products.    Store the trays in a safe dry place AWAY from any heat until your next treatment.    Bring the trays with you for your next dental check-up.  The dentist will confirm their fit.    If anything happens to your Fluoride trays, or they don't fit as well after any dental work, please let us know as soon as possible.  

## 2012-04-28 ENCOUNTER — Ambulatory Visit (INDEPENDENT_AMBULATORY_CARE_PROVIDER_SITE_OTHER): Payer: Medicare Other | Admitting: Internal Medicine

## 2012-04-28 ENCOUNTER — Encounter: Payer: Self-pay | Admitting: Geriatric Medicine

## 2012-04-28 ENCOUNTER — Encounter: Payer: Self-pay | Admitting: Internal Medicine

## 2012-04-28 ENCOUNTER — Other Ambulatory Visit: Payer: Self-pay | Admitting: Radiology

## 2012-04-28 VITALS — BP 118/62 | HR 91 | Temp 98.1°F | Resp 18 | Ht 67.5 in | Wt 138.0 lb

## 2012-04-28 DIAGNOSIS — C069 Malignant neoplasm of mouth, unspecified: Secondary | ICD-10-CM

## 2012-04-28 DIAGNOSIS — M109 Gout, unspecified: Secondary | ICD-10-CM

## 2012-04-28 DIAGNOSIS — L02419 Cutaneous abscess of limb, unspecified: Secondary | ICD-10-CM

## 2012-04-28 DIAGNOSIS — E785 Hyperlipidemia, unspecified: Secondary | ICD-10-CM

## 2012-04-28 DIAGNOSIS — L03119 Cellulitis of unspecified part of limb: Secondary | ICD-10-CM

## 2012-04-28 DIAGNOSIS — I10 Essential (primary) hypertension: Secondary | ICD-10-CM

## 2012-04-28 DIAGNOSIS — C030 Malignant neoplasm of upper gum: Secondary | ICD-10-CM

## 2012-04-28 MED ORDER — NEBIVOLOL HCL 5 MG PO TABS: 5.0000 mg | ORAL_TABLET | Freq: Every day | ORAL | Status: DC

## 2012-04-28 NOTE — Patient Instructions (Addendum)
Please get your uric acid level checked on Tuesday when they draw your blood at the cancer center. If it is still high, I will add allopurinol to your daily medicines.   I stopped your hctz and replaced it with bystolic for your blood pressure.

## 2012-04-28 NOTE — Progress Notes (Signed)
Patient ID: Kristine Morales, female   DOB: [birth year], 79 y.o.   MRN: [id number]  No Known Allergies  Chief Complaint  Patient presents with  . Medical Managment of Chronic Issues    no new problems    HPI: Patient is a 79 y.o. AA female seen in the office today for medical management of chronic diseases.  Mentioned gums bleeding.  Went to her cancer doctor (h/o breast cancer), sent to specialist--got biopsy of gum from ENT.  Then told cancer of neck and gums.  Had surgery done.  Saw dentist yesterday.  Meets with doctor about XRT Monday and is to get feeding tube Tuesday.  Says she is ok handling all of this.  Husband, son and daughter are supporting her.  Is eating well at this point.  Says they gave her pills to make her sleep and they make her hungry, too.    Was treated for gout.  Then saw Dr. Glade Lloyd.  Then had pills and legs felt better--still swollen and sore.  Was taking prednisone for 5 days with benefit, but still swollen and painful today.       Review of Systems:  Review of Systems  Constitutional: Positive for weight loss. Negative for fever and chills.  Eyes: Negative for blurred vision.  Respiratory: Negative for cough and shortness of breath.   Cardiovascular: Negative for chest pain.  Gastrointestinal: Negative for nausea, vomiting, abdominal pain, diarrhea and constipation.  Genitourinary: Negative for dysuria.  Musculoskeletal: Negative for myalgias and falls.  Skin: Negative for rash.  Neurological: Positive for speech change and weakness. Negative for dizziness.       Due to surgery  Endo/Heme/Allergies: Does not bruise/bleed easily.  Psychiatric/Behavioral: The patient is nervous/anxious.      Past Medical History  Diagnosis Date  . Hypertension   . Hyperlipidemia   . Breast cancer, right breast 08/28/2009  . Use of letrozole (Femara) Since October 2012  . 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 Gout, unspecified   . 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 G tube feedings   . Wound infection after surgery 04/14/12    Opened and Packed - Wet to Dry  . Glaucoma   . Oral aphthae   . Complications affecting other specified body systems, hypertension   . Chronic angle-closure glaucoma(365.23)   . Pure hyperglyceridemia   . Allergic rhinitis due to pollen   . Malignant neoplasm of breast (female), unspecified site   . Thyroid disease   . Unspecified constipation   . Hypopotassemia   . Enlargement of lymph nodes   . Cough   . Cough   . Acute sinusitis, unspecified   . Other abnormal blood chemistry   . Unspecified vitamin D deficiency   . Other and unspecified hyperlipidemia   . Gout, unspecified   . Unspecified essential hypertension   . Unspecified arthropathy, lower leg   . Other abnormal blood chemistry    Past Surgical History  Procedure Laterality Date  . Simple mastectomy w/ sentinel node biopsy Right 10/09/2009    Breast  . Soft tissue biopsy, right maxillary alveolus  02/12/2012    Invasive Squamous Cell  . Abdominal hysterectomy  1960's    BSO, bleeding  . Cataract surgery      bilat  . Tracheotomy and left selective neck dissection Left 03/18/12     [facility]: Left  Selective Neck Dissection/Left Infrastructure Maxillectomy, Palatoplasty, Flap Fasciocutaneous Free Flap Reconstruction of Left Maxillary Defect. Split Thickness Skin Graft of Left Forearm Defect Measuring 10/14/cm  . Dobhoff tube placement  03/18/12  . Breast surgery     Social History:   reports that she quit smoking about 3 years ago. Her smoking use included Cigarettes. She has a 2.25 pack-year smoking history. She has never used smokeless tobacco. She reports that  drinks alcohol. She reports that she does not use illicit drugs.  Family History  Problem Relation Age of Onset  . Heart disease Mother     CAD  . Stroke Mother   . Cancer Mother     Breast  .  Cancer Sister     breast  . Heart attack Father   . Heart attack Sister   . Heart disease Sister   . Heart attack Brother     Medications: Patient's Medications  New Prescriptions   ALUM & MAG HYDROXIDE-SIMETH (MAGIC MOUTHWASH W/LIDOCAINE) SOLN    1part nystatin,1part Maaloxplus,1part benadryl,3part 2%viscous lidocaine. Swish/swallow 10 mL up to QID, before meals/bedtime   HYDROCODONE-ACETAMINOPHEN (HYCET) 7.5-325 MG/15 ML SOLUTION    Take 15 mLs by mouth every 4 (four) hours as needed for pain.   NEBIVOLOL (BYSTOLIC) 5 MG TABLET    Take 1 tablet (5 mg total) by mouth daily.   SUCRALFATE (CARAFATE) 1 G TABLET    Crush 1 tablet in 10 mL H20 and swallow 30 min prior to meals and bedtime.  Previous Medications   ALPHAGAN P 0.1 % SOLN    Place 1 drop into both eyes 2 (two) times daily.    ASPIRIN 81 MG TABLET    Take 81 mg by mouth daily.    COLCHICINE 0.6 MG TABLET    Take 0.6 mg by mouth daily.   DORZOLAMIDE-TIMOLOL (COSOPT) 22.3-6.8 MG/ML OPHTHALMIC SOLUTION    Place 1 drop into both eyes 2 (two) times daily.    LETROZOLE (FEMARA) 2.5 MG TABLET    Take 1 tablet (2.5 mg total) by mouth daily.   LOVASTATIN (ALTOPREV) 40 MG 24 HR TABLET    Take 40 mg by mouth at bedtime.   PETROLATUM OINT    Apply to thigh daily   SODIUM FLUORIDE (FLUORISHIELD) 1.1 % GEL DENTAL GEL    Instill one drop of fluoride per tooth space of fluoride tray. Place over teeth for 5 minutes. Remove. Spit out excess. Repeat nightly.   WHITE PETROLATUM (PETROLATUM WHITE) OINT    Apply to thigh daily  Modified Medications   No medications on file  Discontinued Medications   HYDROCHLOROTHIAZIDE (HYDRODIURIL) 50 MG TABLET    Take 50 mg by mouth daily after lunch.    HYDROCODONE-ACETAMINOPHEN (NORCO/VICODIN) 5-325 MG PER TABLET    Take 1 tablet by mouth every 6 (six) hours as needed for pain.    LISINOPRIL (PRINIVIL,ZESTRIL) 40 MG TABLET       Physical Exam: Filed Vitals:   04/28/12 1405  BP: 118/62  Pulse: 91   Temp: 98.1 F (36.7 C)  TempSrc: Oral  Resp: 18  Height: 5' 7.5" (1.715 m)  Weight: 138 lb (62.596 kg)  SpO2: 95%   Physical Exam  Constitutional: She is oriented to person, place, and time. She appears well-developed and well-nourished.  HENT:  Nose: Nose normal.  Mouth/Throat: No oropharyngeal exudate.  Eyes: EOM are normal. Pupils are equal, round, and reactive to light.  Neck: No tracheal deviation present. No thyromegaly present.  Surgical  resection of part of maxilla noted, has graft on upper palate.    Cardiovascular: Normal rate, regular rhythm, normal heart sounds and intact distal pulses.   Pulmonary/Chest: Effort normal and breath sounds normal. No respiratory distress. She has no wheezes.  Abdominal: Soft. Bowel sounds are normal.  Musculoskeletal: Normal range of motion. She exhibits edema and tenderness.  Of ankle and dorsum of foot  Neurological: She is alert and oriented to person, place, and time.  Psychiatric: She has a normal mood and affect.      Labs reviewed: 11/13/2008  CMP: glucose 137, BUN 6, Creatinine 0.81, Potassium 3.4 Lipid: Cholesterol 194, Triglycerides 160, HDL 92, LDL 70 05/31/2009 CBC : Hgb 11.4 CMP: glucose 118, BUN 7, Creatinine 0.77, SGOT 74 HgbA1c 4.9 Lipid: cholesterol 176, triglycerides 86, HDL 95, LDL 64 Uric Acid 6.8 06/19/2009 Hepatic Panel normal 06/09/11 CBC; WBC 4.4, RBC 4.10, HGB 10.8 CMP; Glucose 97, BUN 11, Creatinine 0.68 A1c 5.3 Lipid Panel; Cholesterol 189, Triglycerides 398, HDL 70, LDL 39 Uric Acid 8.7 Vit D 21.7 12/16/11  BMP; Glucose 133, BUN 20, Creatinine 1.19 CBC; WBC 5.4, RBC 4.09, HGB 10.0 Lipid Panel; Cholesterol 198, Triglycerides 235, HDL 69, LDL 82 TSH 3.790 Uric Acid 10.3 Vit D 22.3  Basic Metabolic Panel:  Recent Labs  16/10/96 0919 05/03/12 1205  NA 139 140  K 3.9 4.4  CL 104 103  CO2 24 24  GLUCOSE 142* 96  BUN 25.0 13  CREATININE 1.1 0.81  CALCIUM 10.0 9.5   Liver Function  Tests:  Recent Labs  02/09/12 0919  AST 19  ALT 11  ALKPHOS 101  BILITOT 0.76  PROT 7.7  ALBUMIN 3.4*   CBC:  Recent Labs  02/09/12 0919 04/26/12 1000 05/03/12 1205  WBC 6.3 6.4 6.0  NEUTROABS 3.9  --   --   HGB 9.3* 8.9* 8.5*  HCT 29.2* 28.2* 27.0*  MCV 80.5 78.3 77.1*  PLT 264 339 323   Past Procedures: 2009 Bone Density colonoscopy 2003- Showed diverticulosis and tubular  adenoma     in  future does not want to have anymore colonoscopy   08/07/2009 Mammogram possible mass right breast 08/07/2009 Bone Density 08/19/2009 [place] right breast  suspicious mass right breast biopsy recommended 09/06/2009 MRI breast- 1.5cm enhancing mass in the lower outer quadrant right breast enhancing mass subareolar region right breast. 09/10/2009 [place] right breast 7x6x6 mm mass right breast subareolar breast bone density test 8/11- showed osteopenia 09/19/2010 - Mammogram - No specific evidence of malignancy 11/06/2011 - Mammogram - No evidence of malignancy. 11/09/11 - Bone density - osteopenia  Assessment/Plan Essential hypertension Will d/c hctz and begin bystolic 5mg  to control her blood pressure as she has been having difficulty with gout and quite elevated uric acid levels.    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  Continues to follow up with radiation therapy with numerous upcoming appts and has plans for PEG and chemotherapy.  Doing amazingly well and has a very good attitude.  Notes excellent family and friend support.  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  Other providers' notes, imaging have been reviewed.  Patient also gave an excellent history of what she has been through.  Will continue to follow along during her chemotherapy.  Gout attack Was treated with a course of prednisone.  Said additional labs could be done with her labs on Tuesday so I advised her to request uric acid with those to see  if she  requires long term allopurinol now that I stopped her thiazide diuretic.  Cellulitis and abscess of leg, except foot Was also treated as possible cellulitis.  No gross erythema remains, only some swelling and discomfort suggestive of more of a joint inflammatory process.  Again, she may require long term allopurinol or uloric.  Diuretic trigger was removed.  Encouraged hydration also and elevation of feet.   Labs/tests ordered:  Uric acid level with labs Tuesday at cancer center, please

## 2012-05-02 ENCOUNTER — Other Ambulatory Visit: Payer: Self-pay | Admitting: Radiology

## 2012-05-02 ENCOUNTER — Ambulatory Visit
Admission: RE | Admit: 2012-05-02 | Discharge: 2012-05-02 | Disposition: A | Discharge status: Home / Self Care | Payer: Medicare Other | Source: Ambulatory Visit | Attending: Radiation Oncology | Admitting: Radiation Oncology

## 2012-05-02 ENCOUNTER — Ambulatory Visit: Payer: Medicare Other

## 2012-05-02 ENCOUNTER — Ambulatory Visit: Payer: Medicare Other | Attending: Radiation Oncology

## 2012-05-02 VITALS — BP 150/62 | HR 65 | Temp 97.5°F | Ht 67.5 in | Wt 139.5 lb

## 2012-05-02 DIAGNOSIS — C030 Malignant neoplasm of upper gum: Secondary | ICD-10-CM

## 2012-05-02 NOTE — Progress Notes (Signed)
   Weekly Management Note:  Outpatient Current Dose:  2 Gy  Projected Dose: 66 Gy   Narrative:  The patient presents for routine under treatment assessment.  CBCT/MVCT images/Port film x-rays were reviewed.  The chart was checked. No complaints thus far. No showed for med/onc appt. Hasn't been scheduled for chemotherapy. Open to seeing med/onc however  Physical Findings:  Vitals - 1 value per visit 05/02/2012  SYSTOLIC 150  DIASTOLIC 62  PULSE 65  TEMPERATURE 97.5  RESPIRATIONS   Weight (lb) 139.5  HEIGHT 5' 7.5"  BMI 21.51  VISIT REPORT   Mouth well healed s/p flap reconstruction.  CBC    Component Value Date/Time   WBC 6.4 04/26/2012 1000   WBC 6.3 02/09/2012 0919   RBC 3.60* 04/26/2012 1000   RBC 3.63* 02/09/2012 0919   HGB 8.9* 04/26/2012 1000   HGB 9.3* 02/09/2012 0919   HCT 28.2* 04/26/2012 1000   HCT 29.2* 02/09/2012 0919   PLT 339 04/26/2012 1000   PLT 264 02/09/2012 0919   MCV 78.3 04/26/2012 1000   MCV 80.5 02/09/2012 0919   MCH 24.7* 04/26/2012 1000   MCH 25.6 02/09/2012 0919   MCHC 31.6 04/26/2012 1000   MCHC 31.8 02/09/2012 0919   RDW 17.9* 04/26/2012 1000   RDW 16.4* 02/09/2012 0919   LYMPHSABS 1.9 02/09/2012 0919   LYMPHSABS 2.3 10/04/2009 1314   MONOABS 0.5 02/09/2012 0919   MONOABS 0.5 10/04/2009 1314   EOSABS 0.0 02/09/2012 0919   EOSABS 0.0 10/04/2009 1314   BASOSABS 0.0 02/09/2012 0919   BASOSABS 0.0 10/04/2009 1314    CMP     Component Value Date/Time   NA 139 02/09/2012 0919   NA 142 02/09/2011 1011   K 3.9 02/09/2012 0919   K 3.8 02/09/2011 1011   CL 104 02/09/2012 0919   CL 103 02/09/2011 1011   CO2 24 02/09/2012 0919   CO2 27 02/09/2011 1011   GLUCOSE 142* 02/09/2012 0919   GLUCOSE 106* 02/09/2011 1011   BUN 25.0 02/09/2012 0919   BUN 9 02/09/2011 1011   CREATININE 1.1 02/09/2012 0919   CREATININE 0.76 02/09/2011 1011   CALCIUM 10.0 02/09/2012 0919   CALCIUM 10.0 02/09/2011 1011   PROT 7.7 02/09/2012 0919   PROT 7.4 02/09/2011 1011   ALBUMIN 3.4* 02/09/2012 0919   ALBUMIN  4.4 02/09/2011 1011   AST 19 02/09/2012 0919   AST 35 02/09/2011 1011   ALT 11 02/09/2012 0919   ALT 12 02/09/2011 1011   ALKPHOS 101 02/09/2012 0919   ALKPHOS 80 02/09/2011 1011   BILITOT 0.76 02/09/2012 0919   BILITOT 0.7 02/09/2011 1011   GFRNONAA >60 10/04/2009 1314   GFRAA  Value: >60        The eGFR has been calculated using the MDRD equation. This calculation has not been validated in all clinical situations. eGFR's persistently <60 mL/min signify possible Chronic Kidney Disease. 10/04/2009 1314     Impression:  The patient is tolerating radiotherapy.  Plan:  Continue radiotherapy as planned. Sent pt upstairs to reschedule appt with med/onc. RT had to start today due to suboptimal results per medical data when post op RT is delayed.  ________________________________   Lonie Peak, M.D.

## 2012-05-02 NOTE — Progress Notes (Signed)
Ms. Sturgell is here with her daughter for her weekly put visit.  She has had 1/33 fractions to her right neck.  She does have pain that she rates at a 5 in her left big toe from gout.  She denies fatigue.  She is able to eat a soft diet.

## 2012-05-02 NOTE — Progress Notes (Signed)
IMRT Device Note Outpatient   9.0  delivered field widths represent one set of IMRT treatment devices. The code is [phone].  -----------------------------------  Lonie Peak, MD

## 2012-05-03 ENCOUNTER — Ambulatory Visit (HOSPITAL_COMMUNITY)
Admission: RE | Admit: 2012-05-03 | Discharge: 2012-05-03 | Disposition: A | Discharge status: Home / Self Care | Payer: Medicare Other | Source: Ambulatory Visit | Attending: Radiation Oncology | Admitting: Radiation Oncology

## 2012-05-03 ENCOUNTER — Encounter (HOSPITAL_COMMUNITY): Payer: Self-pay

## 2012-05-03 ENCOUNTER — Telehealth: Payer: Self-pay | Admitting: Oncology

## 2012-05-03 ENCOUNTER — Ambulatory Visit
Admission: RE | Admit: 2012-05-03 | Discharge: 2012-05-03 | Disposition: A | Discharge status: Home / Self Care | Payer: Medicare Other | Source: Ambulatory Visit | Attending: Radiation Oncology | Admitting: Radiation Oncology

## 2012-05-03 DIAGNOSIS — Z87891 Personal history of nicotine dependence: Secondary | ICD-10-CM | POA: Insufficient documentation

## 2012-05-03 DIAGNOSIS — I10 Essential (primary) hypertension: Secondary | ICD-10-CM | POA: Insufficient documentation

## 2012-05-03 DIAGNOSIS — Z853 Personal history of malignant neoplasm of breast: Secondary | ICD-10-CM | POA: Insufficient documentation

## 2012-05-03 DIAGNOSIS — E785 Hyperlipidemia, unspecified: Secondary | ICD-10-CM | POA: Insufficient documentation

## 2012-05-03 DIAGNOSIS — Z79899 Other long term (current) drug therapy: Secondary | ICD-10-CM | POA: Insufficient documentation

## 2012-05-03 DIAGNOSIS — C050 Malignant neoplasm of hard palate: Secondary | ICD-10-CM | POA: Insufficient documentation

## 2012-05-03 LAB — BASIC METABOLIC PANEL
BUN: 13 mg/dL (ref 6–23)
Calcium: 9.5 mg/dL (ref 8.4–10.5)
Creatinine, Ser: 0.81 mg/dL (ref 0.50–1.10)
GFR calc non Af Amer: 68 mL/min — ABNORMAL LOW (ref >90)
Glucose, Bld: 96 mg/dL (ref 70–99)

## 2012-05-03 LAB — CBC
MCH: 24.3 pg — ABNORMAL LOW (ref 26.0–34.0)
MCHC: 31.5 g/dL (ref 30.0–36.0)
Platelets: 323 10*3/uL (ref 150–400)

## 2012-05-03 LAB — PROTIME-INR: Prothrombin Time: 12.6 seconds (ref 11.6–15.2)

## 2012-05-03 MED ORDER — HYDROCODONE-ACETAMINOPHEN 5-325 MG PO TABS
1.0000 | ORAL_TABLET | ORAL | Status: DC | PRN
  Administered 2012-05-03: 1 via ORAL
  Filled 2012-05-03: qty 1

## 2012-05-03 MED ORDER — IOHEXOL 300 MG/ML  SOLN
10.0000 mL | Freq: Once | INTRAMUSCULAR | Status: AC | PRN
  Administered 2012-05-03: 10 mL

## 2012-05-03 MED ORDER — SODIUM CHLORIDE 0.9 % IV SOLN
Freq: Once | INTRAVENOUS | Status: AC
  Administered 2012-05-03: 12:00:00 via INTRAVENOUS

## 2012-05-03 MED ORDER — FENTANYL CITRATE 0.05 MG/ML IJ SOLN
INTRAMUSCULAR | Status: AC | PRN
  Administered 2012-05-03: 50 ug via INTRAVENOUS

## 2012-05-03 MED ORDER — LIDOCAINE HCL 1 % IJ SOLN
INTRAMUSCULAR | Status: AC
  Filled 2012-05-03: qty 20

## 2012-05-03 MED ORDER — CEFAZOLIN SODIUM 1-5 GM-% IV SOLN
1.0000 g | Freq: Once | INTRAVENOUS | Status: AC
  Administered 2012-05-03: 1 g via INTRAVENOUS
  Filled 2012-05-03: qty 50

## 2012-05-03 MED ORDER — FENTANYL CITRATE 0.05 MG/ML IJ SOLN
INTRAMUSCULAR | Status: AC
  Filled 2012-05-03: qty 4

## 2012-05-03 MED ORDER — MIDAZOLAM HCL 2 MG/2ML IJ SOLN
INTRAMUSCULAR | Status: AC
  Filled 2012-05-03: qty 4

## 2012-05-03 MED ORDER — ONDANSETRON HCL 4 MG/2ML IJ SOLN
4.0000 mg | INTRAMUSCULAR | Status: DC | PRN
  Administered 2012-05-03: 4 mg via INTRAVENOUS
  Filled 2012-05-03: qty 2

## 2012-05-03 MED ORDER — GLUCAGON HCL (RDNA) 1 MG IJ SOLR
INTRAMUSCULAR | Status: AC
  Administered 2012-05-03: 0.5 mg
  Filled 2012-05-03: qty 1

## 2012-05-03 MED ORDER — MIDAZOLAM HCL 2 MG/2ML IJ SOLN
INTRAMUSCULAR | Status: AC | PRN
  Administered 2012-05-03 (×2): 1 mg via INTRAVENOUS

## 2012-05-03 NOTE — H&P (Signed)
Chief Complaint: "I'm here for a feeding tube" Referring Physician:Squire HPI: Kristine Morales is an 79 y.o. female with hx of head and neck cancer who underwent neck dissection and flap reconstruction at [facility]. She is recovering well from that, but is to receive chemoradiation and is at risk for developing dysphagia. She is therefore, referred to IR for placement of perc G-tube. She has been on soft solids diet. PMHx and meds reviewed.  Past Medical History:  Past Medical History  Diagnosis Date  . Hypertension   . Hyperlipidemia   . Breast cancer, right breast 08/28/2009  . Use of letrozole (Femara) Since October 2012  . 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 Gout, unspecified   . 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 G tube feedings   . Wound infection after surgery 04/14/12    Opened and Packed - Wet to Dry  . Glaucoma   . Oral aphthae   . Complications affecting other specified body systems, hypertension   . Chronic angle-closure glaucoma(365.23)   . Pure hyperglyceridemia   . Allergic rhinitis due to pollen   . Malignant neoplasm of breast (female), unspecified site   . Thyroid disease   . Unspecified constipation   . Hypopotassemia   . Enlargement of lymph nodes   . Cough   . Cough   . Acute sinusitis, unspecified   . Other abnormal blood chemistry   . Unspecified vitamin D deficiency   . Other and unspecified hyperlipidemia   . Gout, unspecified   . Unspecified essential hypertension   . Unspecified arthropathy, lower leg   . Other abnormal blood chemistry     Past Surgical History:  Past Surgical History  Procedure Laterality Date  . Simple mastectomy w/ sentinel node biopsy Right 10/09/2009    Breast  . Soft tissue biopsy, right maxillary alveolus  02/12/2012    Invasive Squamous Cell  . Abdominal hysterectomy  1960's    BSO, bleeding  . Cataract surgery       bilat  . Tracheotomy and left selective neck dissection Left 03/18/12     [facility]: Left Selective Neck Dissection/Left Infrastructure Maxillectomy, Palatoplasty, Flap Fasciocutaneous Free Flap Reconstruction of Left Maxillary Defect. Split Thickness Skin Graft of Left Forearm Defect Measuring 10/14/cm  . Dobhoff tube placement  03/18/12  . Breast surgery      Family History:  Family History  Problem Relation Age of Onset  . Heart disease Mother     CAD  . Stroke Mother   . Cancer Mother     Breast  . Cancer Sister     breast  . Heart attack Father   . Heart attack Sister   . Heart disease Sister   . Heart attack Brother     Social History:  reports that she quit smoking about 3 years ago. Her smoking use included Cigarettes. She has a 2.25 pack-year smoking history. She has never used smokeless tobacco. She reports that  drinks alcohol. She reports that she does not use illicit drugs.  Allergies: No Known Allergies  Medications: ALPHAGAN P 0.1 % SOLN (Taking) 02/04/2012 Sig - Route: Place 1 drop into both eyes 2 (two) times daily. - Both Eyes Class: Historical Med Number of times this order has been changed since signing: 2 Order [address] aspirin 81 MG tablet (Taking) Sig - Route: Take 81 mg by mouth  daily. - Oral Class: Historical Med Number of times this order has been changed since signing: 2 Order [address] colchicine 0.6 MG tablet (Taking) Sig - Route: Take 0.6 mg by mouth daily. - Oral Class: Historical Med dorzolamide-timolol (COSOPT) 22.3-6.8 MG/ML ophthalmic solution (Taking) 02/04/2012 Sig - Route: Place 1 drop into both eyes 2 (two) times daily. - Both Eyes Class: Historical Med Number of times this order has been changed since signing: 2 Order [address] letrozole (FEMARA) 2.5 MG tablet (Taking) 30 tablet 11 02/16/2012 Sig - Route: Take 1 tablet (2.5 mg total) by mouth daily. - Oral Number of times this order has been changed since signing: 1 Order [address] lovastatin  (ALTOPREV) 40 MG 24 hr tablet (Taking) Sig - Route: Take 40 mg by mouth at bedtime. - Oral Class: Historical Med Number of times this order has been changed since signing: 1 Order [address] sodium fluoride (FLUORISHIELD) 1.1 % GEL dental gel (Taking) 120 mL prn 04/25/2012 04/24/2013 Sig: Instill one drop of fluoride per tooth space of fluoride tray. Place over teeth for 5 minutes. Remove. Spit out excess. Repeat nightly. Class: Fax Number of times this order has been changed since signing: 1 Order [address] White Petrolatum (PETROLATUM WHITE) OINT    Please HPI for pertinent positives, otherwise complete 10 system ROS negative.  Physical Exam: There were no vitals taken for this visit. There is no weight on file to calculate BMI.   General Appearance:  Alert, cooperative, no distress, appears stated age  Head:  Normocephalic, without obvious abnormality, atraumatic  ENT: S/p head and neck surgery, no obvious signs of infection.  Neck: Supple, symmetrical, trachea midline  Lungs:   Clear to auscultation bilaterally, no w/r/r, respirations unlabored without use of accessory muscles.  Chest Wall:  No tenderness or deformity  Heart:  Regular rate and rhythm, S1, S2 normal, no murmur, rub or gallop.   Abdomen:   Soft, non-tender, non distended. Bowel sounds active all four quadrants,  no masses, no organomegaly.  Skin: Skin color, texture, turgor normal, no rashes or lesions  Neurologic: Normal affect, no gross deficits.     Assessment/Plan Squamous cell cancer of hard palate For G-tube due to risk of dysphagia from upcoming chemoradiation treatments. Discussed G-tube placement, risks, complications, use of sedation. Labs pending. Consent signed in chart  Brayton El PA-C 05/03/2012, 11:55 AM

## 2012-05-03 NOTE — Telephone Encounter (Signed)
C/D 05/03/12 for appt. 05/06/12

## 2012-05-03 NOTE — Progress Notes (Signed)
Paged Dr Deanne Coffer for post procedure orders status post G tube placement.

## 2012-05-03 NOTE — Procedures (Signed)
37F gastrostomy tube placed No complication No blood loss. See complete dictation in [facility].

## 2012-05-04 ENCOUNTER — Ambulatory Visit: Payer: Medicare Other

## 2012-05-05 ENCOUNTER — Ambulatory Visit
Admission: RE | Admit: 2012-05-05 | Discharge: 2012-05-05 | Disposition: A | Discharge status: Home / Self Care | Payer: Medicare Other | Source: Ambulatory Visit | Attending: Radiation Oncology | Admitting: Radiation Oncology

## 2012-05-05 ENCOUNTER — Other Ambulatory Visit: Payer: Self-pay | Admitting: Physician Assistant

## 2012-05-05 DIAGNOSIS — C50911 Malignant neoplasm of unspecified site of right female breast: Secondary | ICD-10-CM

## 2012-05-05 DIAGNOSIS — C030 Malignant neoplasm of upper gum: Secondary | ICD-10-CM

## 2012-05-06 ENCOUNTER — Ambulatory Visit
Admission: RE | Admit: 2012-05-06 | Discharge: 2012-05-06 | Disposition: A | Discharge status: Home / Self Care | Payer: Medicare Other | Source: Ambulatory Visit | Attending: Radiation Oncology | Admitting: Radiation Oncology

## 2012-05-06 ENCOUNTER — Other Ambulatory Visit: Payer: Medicare Other | Admitting: Lab

## 2012-05-06 ENCOUNTER — Ambulatory Visit: Payer: Medicare Other

## 2012-05-06 ENCOUNTER — Encounter: Payer: Medicare Other | Admitting: Oncology

## 2012-05-09 ENCOUNTER — Ambulatory Visit
Admission: RE | Admit: 2012-05-09 | Discharge: 2012-05-09 | Disposition: A | Discharge status: Home / Self Care | Payer: Medicare Other | Source: Ambulatory Visit | Attending: Radiation Oncology | Admitting: Radiation Oncology

## 2012-05-09 ENCOUNTER — Encounter: Payer: Self-pay | Admitting: *Deleted

## 2012-05-09 ENCOUNTER — Encounter: Payer: Self-pay | Admitting: Radiation Oncology

## 2012-05-09 VITALS — BP 135/61 | HR 67 | Temp 97.9°F | Wt 139.5 lb

## 2012-05-09 DIAGNOSIS — C760 Malignant neoplasm of head, face and neck: Secondary | ICD-10-CM

## 2012-05-09 MED ORDER — HYDROCODONE-ACETAMINOPHEN 7.5-325 MG/15ML PO SOLN: 15.0000 mL | ORAL | Status: DC | PRN

## 2012-05-09 MED ORDER — MAGIC MOUTHWASH W/LIDOCAINE: ORAL | Status: DC

## 2012-05-09 MED ORDER — SUCRALFATE 1 G PO TABS: ORAL_TABLET | ORAL | Status: DC

## 2012-05-09 NOTE — Progress Notes (Signed)
   Weekly Management Note:  Outpatient Current Dose:  10 Gy  Projected Dose: 66 Gy   Narrative:  The patient presents for routine under treatment assessment.  CBCT/MVCT images/Port film x-rays were reviewed.  The chart was checked. Sense of taste changing. Throbbing pain in right face.  Missed one RT treatment last week.  No showed for 3rd time to med/onc consult.  Physical Findings:  weight is 139 lb 8 oz (63.277 kg). Her temperature is 97.9 F (36.6 C). Her blood pressure is 135/61 and her pulse is 67.  Erythema in oropharynx. No thrush.  CBC    Component Value Date/Time   WBC 6.0 05/03/2012 1205   WBC 6.3 02/09/2012 0919   RBC 3.50* 05/03/2012 1205   RBC 3.63* 02/09/2012 0919   HGB 8.5* 05/03/2012 1205   HGB 9.3* 02/09/2012 0919   HCT 27.0* 05/03/2012 1205   HCT 29.2* 02/09/2012 0919   PLT 323 05/03/2012 1205   PLT 264 02/09/2012 0919   MCV 77.1* 05/03/2012 1205   MCV 80.5 02/09/2012 0919   MCH 24.3* 05/03/2012 1205   MCH 25.6 02/09/2012 0919   MCHC 31.5 05/03/2012 1205   MCHC 31.8 02/09/2012 0919   RDW 17.8* 05/03/2012 1205   RDW 16.4* 02/09/2012 0919   LYMPHSABS 1.9 02/09/2012 0919   LYMPHSABS 2.3 10/04/2009 1314   MONOABS 0.5 02/09/2012 0919   MONOABS 0.5 10/04/2009 1314   EOSABS 0.0 02/09/2012 0919   EOSABS 0.0 10/04/2009 1314   BASOSABS 0.0 02/09/2012 0919   BASOSABS 0.0 10/04/2009 1314    CMP     Component Value Date/Time   NA 140 05/03/2012 1205   NA 139 02/09/2012 0919   K 4.4 05/03/2012 1205   K 3.9 02/09/2012 0919   CL 103 05/03/2012 1205   CL 104 02/09/2012 0919   CO2 24 05/03/2012 1205   CO2 24 02/09/2012 0919   GLUCOSE 96 05/03/2012 1205   GLUCOSE 142* 02/09/2012 0919   BUN 13 05/03/2012 1205   BUN 25.0 02/09/2012 0919   CREATININE 0.81 05/03/2012 1205   CREATININE 1.1 02/09/2012 0919   CALCIUM 9.5 05/03/2012 1205   CALCIUM 10.0 02/09/2012 0919   PROT 7.7 02/09/2012 0919   PROT 7.4 02/09/2011 1011   ALBUMIN 3.4* 02/09/2012 0919   ALBUMIN 4.4 02/09/2011 1011   AST 19 02/09/2012 0919    AST 35 02/09/2011 1011   ALT 11 02/09/2012 0919   ALT 12 02/09/2011 1011   ALKPHOS 101 02/09/2012 0919   ALKPHOS 80 02/09/2011 1011   BILITOT 0.76 02/09/2012 0919   BILITOT 0.7 02/09/2011 1011   GFRNONAA 68* 05/03/2012 1205   GFRAA 79* 05/03/2012 1205     Impression:  The patient is tolerating radiotherapy.  Plan:  Continue radiotherapy as planned. Social work seeing patient today to assess needs, help with compliance.  Rx given today:  Baking Soda Rinse - 1 tsp salt, 1 tsp baking soda, 1 qt water - swish gargle and spit as needed to soothe/cleanse mouth. Use as often as you want.  Sucralfate - crush 1 tablet in 10 mL H20 and swallow up to four times a day to soothe throat.  Magic mouthwash with Lidocaine - Swish/swallow 10mL, 30 minutes before meals and bedtime, to numb up sore mouth and/or throat. Alternate with Sucralfate.  Hycet - for pain in spite of the meds above.  Use in addition to the meds above.  ________________________________   Lonie Peak, M.D.

## 2012-05-09 NOTE — Patient Instructions (Signed)
Baking Soda Rinse - 1 tsp salt, 1 tsp baking soda, 1 qt water - swish gargle and spit as needed to soothe/cleanse mouth. Use as often as you want.  Sucralfate - crush 1 tablet in 10 mL H20 and swallow up to four times a day to soothe throat.  Magic mouthwash with Lidocaine - Swish/swallow 10mL, 30 minutes before meals and bedtime, to numb up sore mouth and/or throat. Alternate with Sucralfate.  Hycet - for pain in spite of the meds above.  Use in addition to the meds above. 

## 2012-05-09 NOTE — Progress Notes (Signed)
[facility] Clinical Social Work  Clinical Social Work met with patient in radiation oncology exam room to assess for psychosocial needs and provide support/resources.  Mrs. Kizer had no family/caregivers present with her at this time.  She stated her spouse and daughter are her biggest supports, however, they both work and are unable to accompany her to all appointments.  Patient states she is currently experiencing throbbing, sharp pain on the right side of her face.  CSW reported pain to Sonda Rumble, Charity fundraiser.  Mrs. Arentz states she "feels down every now and then".  CSW encouraged patient to share how she copes with emotions; patient states she often goes to sleep or reads/watches TV to "take her mind off of it".  CSW briefly discussed counseling services/support programs offered at [facility].  Patient agreed for CSW to "check in" on patient to explore how she is coping throughout treatment process.  CSW and patient discussed potential barriers to treatment such as transportation concerns.  Mrs. Hagner states her daughter works 12 hour shifts and her spouse works Armed forces operational officer.  She believes she will be able to drive to future appointments, but states she is "trying to get used to her feeding tube" and does not feel comfortable driving.  CSW shared transportation resources such as Network [employer] and [facility].  The patient states she does not need resources at this time, but will contact CSW in the future if needed.  CSW strongly encouraged patient to utilize resources to attend medical oncology/radiation oncology appointments and treatment.   Kathrin Penner, MSW, LCSW Clinical Social Worker [facility] [phone]

## 2012-05-10 ENCOUNTER — Telehealth: Payer: Self-pay | Admitting: *Deleted

## 2012-05-10 ENCOUNTER — Ambulatory Visit
Admission: RE | Admit: 2012-05-10 | Discharge: 2012-05-10 | Disposition: A | Discharge status: Home / Self Care | Payer: Medicare Other | Source: Ambulatory Visit | Attending: Radiation Oncology | Admitting: Radiation Oncology

## 2012-05-10 NOTE — Telephone Encounter (Signed)
CALLED PATIENT TO INFORM OF WEEKLY NUTRITION APPTS. PATIENT STATED THAT SHE LIKES TO GO AFTER RADIATION AND LIE DOWN, SHE DIDN'T SEEM TO BE INTERESTED, NOTIFIED BARBARA NEFF VIA PHONE.

## 2012-05-10 NOTE — Telephone Encounter (Signed)
CALLED PATIENT TO INFORM OF WEEEKLY NUTRITION APPTS., NO ANSWER WILL CALL LATER.

## 2012-05-11 ENCOUNTER — Ambulatory Visit
Admission: RE | Admit: 2012-05-11 | Discharge: 2012-05-11 | Disposition: A | Discharge status: Home / Self Care | Payer: Medicare Other | Source: Ambulatory Visit | Attending: Radiation Oncology | Admitting: Radiation Oncology

## 2012-05-11 ENCOUNTER — Encounter: Payer: Self-pay | Admitting: Nutrition

## 2012-05-12 ENCOUNTER — Ambulatory Visit
Admission: RE | Admit: 2012-05-12 | Discharge: 2012-05-12 | Disposition: A | Discharge status: Home / Self Care | Payer: Medicare Other | Source: Ambulatory Visit | Attending: Radiation Oncology | Admitting: Radiation Oncology

## 2012-05-13 ENCOUNTER — Encounter: Payer: Self-pay | Admitting: Nutrition

## 2012-05-13 ENCOUNTER — Ambulatory Visit
Admission: RE | Admit: 2012-05-13 | Discharge: 2012-05-13 | Disposition: A | Discharge status: Home / Self Care | Payer: Medicare Other | Source: Ambulatory Visit | Attending: Radiation Oncology | Admitting: Radiation Oncology

## 2012-05-14 NOTE — Assessment & Plan Note (Signed)
Was also treated as possible cellulitis.  No gross erythema remains, only some swelling and discomfort suggestive of more of a joint inflammatory process.  Again, she may require long term allopurinol or uloric.  Diuretic trigger was removed.  Encouraged hydration also and elevation of feet.

## 2012-05-14 NOTE — Assessment & Plan Note (Signed)
Has had surgical resection.  Other providers' notes, imaging have been reviewed.  Patient also gave an excellent history of what she has been through.  Will continue to follow along during her chemotherapy.

## 2012-05-14 NOTE — Assessment & Plan Note (Signed)
Will d/c hctz and begin bystolic 5mg  to control her blood pressure as she has been having difficulty with gout and quite elevated uric acid levels.

## 2012-05-14 NOTE — Assessment & Plan Note (Addendum)
Has had surgical resection and grafting.  Continues to follow up with radiation therapy with numerous upcoming appts and has plans for PEG and chemotherapy.  Doing amazingly well and has a very good attitude.  Notes excellent family and friend support.

## 2012-05-14 NOTE — Assessment & Plan Note (Signed)
Was treated with a course of prednisone.  Said additional labs could be done with her labs on Tuesday so I advised her to request uric acid with those to see if she requires long term allopurinol now that I stopped her thiazide diuretic.

## 2012-05-16 ENCOUNTER — Encounter: Payer: Self-pay | Admitting: Nutrition

## 2012-05-16 ENCOUNTER — Ambulatory Visit
Admission: RE | Admit: 2012-05-16 | Discharge: 2012-05-16 | Disposition: A | Discharge status: Home / Self Care | Payer: Medicare Other | Source: Ambulatory Visit | Admitting: Radiation Oncology

## 2012-05-16 ENCOUNTER — Ambulatory Visit: Payer: Medicare Other

## 2012-05-16 ENCOUNTER — Ambulatory Visit
Admission: RE | Admit: 2012-05-16 | Discharge: 2012-05-16 | Disposition: A | Discharge status: Home / Self Care | Payer: Medicare Other | Source: Ambulatory Visit | Attending: Radiation Oncology | Admitting: Radiation Oncology

## 2012-05-16 VITALS — BP 135/62 | HR 66 | Temp 98.8°F | Ht 66.0 in | Wt 137.6 lb

## 2012-05-16 DIAGNOSIS — C030 Malignant neoplasm of upper gum: Secondary | ICD-10-CM

## 2012-05-16 NOTE — Progress Notes (Signed)
Weekly Management Note:  Site: Right maxilla/neck Current Dose:  1800  cGy Projected Dose: 6600  cGy  Narrative: The patient is seen today for routine under treatment assessment. CBCT/MVCT images/port films were reviewed. The chart was reviewed.   She has not yet started to use her PEG tube. She's been visited by the home health nurse this Wednesday. She does have more right mandibular discomfort when opening her mouth. She has not yet filled her pain medication prescription. Her weight is down 2 pounds over the past week.  Physical Examination:  Filed Vitals:   05/16/12 2008  BP: 135/62  Pulse: 66  Temp: 98.8 F (37.1 C)  .  Weight: 137 lb 9.6 oz (62.415 kg). On inspection oral cavity there is no evidence for candidiasis. There is a pigmented graft from her forearm along her right palate.  Impression: Tolerating radiation therapy well. I instructed her to increase her caloric intake either by mouth and/or PEG tube. Also instructed her to get her Hycet pain medication filled.  Plan: Continue radiation therapy as planned.

## 2012-05-16 NOTE — Progress Notes (Signed)
Kristine Morales here for weekly under treat visit.  She has had 9/33 fractions to her right jaw.  She does have pain in her right jaw when she opens her mouth.  She states that her throat is also sore.  She is having pain in her jaw when she tries to eat.  She is able to eat soft foods.  She also states her right jaw is swollen.  She does have fatigue.

## 2012-05-17 ENCOUNTER — Ambulatory Visit
Admission: RE | Admit: 2012-05-17 | Discharge: 2012-05-17 | Disposition: A | Discharge status: Home / Self Care | Payer: Medicare Other | Source: Ambulatory Visit | Attending: Radiation Oncology | Admitting: Radiation Oncology

## 2012-05-17 ENCOUNTER — Ambulatory Visit: Payer: Medicare Other

## 2012-05-18 ENCOUNTER — Ambulatory Visit: Payer: Medicare Other

## 2012-05-18 ENCOUNTER — Ambulatory Visit
Admission: RE | Admit: 2012-05-18 | Discharge: 2012-05-18 | Disposition: A | Discharge status: Home / Self Care | Payer: Medicare Other | Source: Ambulatory Visit | Attending: Radiation Oncology | Admitting: Radiation Oncology

## 2012-05-19 ENCOUNTER — Ambulatory Visit: Payer: Self-pay

## 2012-05-19 ENCOUNTER — Other Ambulatory Visit: Payer: Self-pay | Admitting: Lab

## 2012-05-19 ENCOUNTER — Ambulatory Visit: Payer: Medicare Other

## 2012-05-19 ENCOUNTER — Ambulatory Visit
Admission: RE | Admit: 2012-05-19 | Discharge: 2012-05-19 | Disposition: A | Discharge status: Home / Self Care | Payer: Medicare Other | Source: Ambulatory Visit | Attending: Radiation Oncology | Admitting: Radiation Oncology

## 2012-05-19 ENCOUNTER — Ambulatory Visit: Payer: Self-pay | Admitting: Oncology

## 2012-05-20 ENCOUNTER — Ambulatory Visit
Admission: RE | Admit: 2012-05-20 | Discharge: 2012-05-20 | Disposition: A | Discharge status: Home / Self Care | Payer: Medicare Other | Source: Ambulatory Visit | Attending: Radiation Oncology | Admitting: Radiation Oncology

## 2012-05-23 ENCOUNTER — Ambulatory Visit
Admission: RE | Admit: 2012-05-23 | Discharge: 2012-05-23 | Disposition: A | Discharge status: Home / Self Care | Payer: Medicare Other | Source: Ambulatory Visit | Attending: Radiation Oncology | Admitting: Radiation Oncology

## 2012-05-23 ENCOUNTER — Encounter: Payer: Self-pay | Admitting: Radiation Oncology

## 2012-05-24 ENCOUNTER — Other Ambulatory Visit (HOSPITAL_COMMUNITY): Payer: Self-pay | Admitting: Dentistry

## 2012-05-24 ENCOUNTER — Encounter: Payer: Self-pay | Admitting: Nutrition

## 2012-05-24 ENCOUNTER — Ambulatory Visit
Admission: RE | Admit: 2012-05-24 | Discharge: 2012-05-24 | Disposition: A | Discharge status: Home / Self Care | Payer: Medicare Other | Source: Ambulatory Visit | Attending: Radiation Oncology | Admitting: Radiation Oncology

## 2012-05-24 NOTE — Progress Notes (Unsigned)
Patient did not show up for nutrition followup. Patient has indicated that she is not interested in nutrition education. All nutrition followup scheduled in the future will be discontinued. Patient has been told I'm here for nutritional consultation as needed.

## 2012-05-25 ENCOUNTER — Ambulatory Visit
Admission: RE | Admit: 2012-05-25 | Discharge: 2012-05-25 | Disposition: A | Discharge status: Home / Self Care | Payer: Medicare Other | Source: Ambulatory Visit | Attending: Radiation Oncology | Admitting: Radiation Oncology

## 2012-05-25 ENCOUNTER — Encounter: Payer: Self-pay | Admitting: Radiation Oncology

## 2012-05-25 ENCOUNTER — Ambulatory Visit (HOSPITAL_COMMUNITY): Payer: Self-pay | Admitting: Dentistry

## 2012-05-25 ENCOUNTER — Encounter (HOSPITAL_COMMUNITY): Payer: Self-pay | Admitting: Dentistry

## 2012-05-25 VITALS — BP 123/60 | HR 68 | Temp 98.1°F | Wt 133.3 lb

## 2012-05-25 VITALS — BP 130/72 | HR 67 | Temp 98.9°F | Wt 130.0 lb

## 2012-05-25 DIAGNOSIS — C030 Malignant neoplasm of upper gum: Secondary | ICD-10-CM

## 2012-05-25 DIAGNOSIS — Z0189 Encounter for other specified special examinations: Secondary | ICD-10-CM

## 2012-05-25 DIAGNOSIS — R1310 Dysphagia, unspecified: Secondary | ICD-10-CM

## 2012-05-25 DIAGNOSIS — K1233 Oral mucositis (ulcerative) due to radiation: Secondary | ICD-10-CM

## 2012-05-25 DIAGNOSIS — Z09 Encounter for follow-up examination after completed treatment for conditions other than malignant neoplasm: Secondary | ICD-10-CM

## 2012-05-25 DIAGNOSIS — R432 Parageusia: Secondary | ICD-10-CM

## 2012-05-25 NOTE — Patient Instructions (Addendum)
RECOMMENDATIONS: 1. Brush after meals and at bedtime.  Use fluoride at bedtime. 2. Use trismus exercises as directed. 3. Use Biotene Rinse or salt water/baking soda rinses. 4. Multiple sips of water as needed. 5. Return to clinic one month after radiation therapy has been completed. Patient to call for this appointment.  Call if problems before then.  Charlynne Pander, DDS

## 2012-05-25 NOTE — Progress Notes (Signed)
05/25/2012  Patient:            Kristine Morales Date of Birth:  1931/11/21 MRN:                [id number]  BP 130/72  Pulse 67  Temp(Src) 98.9 F (37.2 C)  Wt 130 lb (58.968 kg)  BMI 20.99 kg/m2  [name] presents for periodic oral examination during radiation therapy. Patient is not sure of the number of treatments she has received to date.  Patient is to receive 33 total treatments and has approximately 12-13 treatments to date per chart review.  Patient has had multiple missing appointments.  REVIEW OF CHIEF COMPLAINTS:  DRY MOUTH: Yes HARD TO SWALLOW: Yes  HURT TO SWALLOW: Yes TASTE CHANGES: Patient has no taste remaining. SORES IN MOUTH: Yes in the back of throat TRISMUS: Patient has decreased maximum interincisal opening.  WEIGHT: 130 pounds with a loss of 10 pounds at start of therapy.  HOME OH REGIMEN:  BRUSHING: Patient cases she is not brushing every day. Patient was encouraged to brush at least twice a day. FLOSSING: She does not floss everyday. Patient encouraged to floss at least 3-4 times a week. RINSING: Patient is using a nonalcohol based mouth rinse daily. Patient is unsure the name of the mouth rinse. FLUORIDE: Patient is not using her fluoride therapy daily. Patient was instructed on the use of daily fluoride in the fluoride tray to help prevent dental caries. TRISMUS EXERCISES:  Maximum interincisal opening: Currently 23 mm. Patient encouraged to utilize trismus exercises twice a day.   DENTAL EXAM:  Oral Hygiene:(PLAQUE): Some plaque noted. Oral hygiene improvement was highly suggested. LOCATION OF MUCOSITIS: Back of throat. Right and left buccal mucosa. DESCRIPTION OF SALIVA: Decreased. Moderate xerostomia. ANY EXPOSED BONE: None noted.  OTHER WATCHED AREAS: Maxillary right surgical site and reconstruction area. DX:  1. Xerostomia 2. Dysphagia 3. Odynophagia 4. Mucositis 5. Trismus with decreased maximum interincisal 6. Dysgeusia 7. Plaque  accumulations  RECOMMENDATIONS: 1. Brush after meals and at bedtime.  Use fluoride at bedtime. 2. Use trismus exercises as directed. 3. Use Biotene Rinse or salt water/baking soda rinses. 4. Multiple sips of water as needed. 5. Return to clinic one month after radiation therapy has been completed. Patient to call for this appointment.  Call if problems before then.  Charlynne Pander 05/25/2012

## 2012-05-25 NOTE — Progress Notes (Signed)
   Weekly Management Note:  Outpatient Current Dose:  30Gy  Projected Dose: 66 Gy   Narrative:  The patient presents for routine under treatment assessment.  CBCT/MVCT images/Port film x-rays were reviewed.  The chart was checked. She reports right jaw soreness.  Cannot stay long - needs to catch ride.   Refuses to come to nursing for assessment  Physical Findings:  vitals were not taken for this visit. No obvious mucositis in mouth  CBC    Component Value Date/Time   WBC 6.0 05/03/2012 1205   WBC 6.3 02/09/2012 0919   RBC 3.50* 05/03/2012 1205   RBC 3.63* 02/09/2012 0919   HGB 8.5* 05/03/2012 1205   HGB 9.3* 02/09/2012 0919   HCT 27.0* 05/03/2012 1205   HCT 29.2* 02/09/2012 0919   PLT 323 05/03/2012 1205   PLT 264 02/09/2012 0919   MCV 77.1* 05/03/2012 1205   MCV 80.5 02/09/2012 0919   MCH 24.3* 05/03/2012 1205   MCH 25.6 02/09/2012 0919   MCHC 31.5 05/03/2012 1205   MCHC 31.8 02/09/2012 0919   RDW 17.8* 05/03/2012 1205   RDW 16.4* 02/09/2012 0919   LYMPHSABS 1.9 02/09/2012 0919   LYMPHSABS 2.3 10/04/2009 1314   MONOABS 0.5 02/09/2012 0919   MONOABS 0.5 10/04/2009 1314   EOSABS 0.0 02/09/2012 0919   EOSABS 0.0 10/04/2009 1314   BASOSABS 0.0 02/09/2012 0919   BASOSABS 0.0 10/04/2009 1314    CMP     Component Value Date/Time   NA 140 05/03/2012 1205   NA 139 02/09/2012 0919   K 4.4 05/03/2012 1205   K 3.9 02/09/2012 0919   CL 103 05/03/2012 1205   CL 104 02/09/2012 0919   CO2 24 05/03/2012 1205   CO2 24 02/09/2012 0919   GLUCOSE 96 05/03/2012 1205   GLUCOSE 142* 02/09/2012 0919   BUN 13 05/03/2012 1205   BUN 25.0 02/09/2012 0919   CREATININE 0.81 05/03/2012 1205   CREATININE 1.1 02/09/2012 0919   CALCIUM 9.5 05/03/2012 1205   CALCIUM 10.0 02/09/2012 0919   PROT 7.7 02/09/2012 0919   PROT 7.4 02/09/2011 1011   ALBUMIN 3.4* 02/09/2012 0919   ALBUMIN 4.4 02/09/2011 1011   AST 19 02/09/2012 0919   AST 35 02/09/2011 1011   ALT 11 02/09/2012 0919   ALT 12 02/09/2011 1011   ALKPHOS 101 02/09/2012 0919   ALKPHOS 80 02/09/2011 1011   BILITOT 0.76 02/09/2012 0919   BILITOT 0.7 02/09/2011 1011   GFRNONAA 68* 05/03/2012 1205   GFRAA 79* 05/03/2012 1205     Impression:  The patient is tolerating radiotherapy.  Plan:  Continue radiotherapy as planned. Will see her on Wed when she can stay longer.  ________________________________   Lonie Peak, M.D.

## 2012-05-25 NOTE — Progress Notes (Addendum)
Kristine Morales has received 14 fractions to her right maxilla, and bilateral neck regions.  She is c/o sore throat with increased pain to a level 8 in her right jaw area since she is performing her exercises as instructed by Dr. Kristin Bruins.   She c/o soreness dysphagia and odonyphagia to soft foods.. The majority of her nutrition is via her peg tube and instills at least 2 cans daily.  She has lost 4 lbs since last week.  Encouraged her to instill 4 cans daily via her peg and she agreed. Note redness at the back of her throat.  Will reschedule her to see the dietician.  Her skin is intact with hyperpigmentation.  Reviewed skin care and management of her of her sore throat.  She received her Radiation [facility] booklet last week with her family present.  She admits to not reading the booklet, so she was encouraged to please read the sections on sore mouth and sore throat.

## 2012-05-25 NOTE — Progress Notes (Signed)
   Weekly Management Note:  Outpatient Current Dose:  34 Gy  Projected Dose: 66 Gy   Narrative:  The patient presents for routine under treatment assessment.  CBCT/MVCT images/Port film x-rays were reviewed.  The chart was checked. She has not attended multiple scheduled visits with medical oncology due to what I can understand to be significant social barriers. Social work is involved. There are concerns regarding her ability to comply with chemotherapy in light of noncompliance with appointments and limited social support.  She continues to have right jaw pain. Couldn't afford MMW but is using Hycet occasionally (once a day). Poor peg tube use.  Losing weight.  Not attending nutrition appointments. Has dysphagia, odynophagia.  Physical Findings:  weight is 133 lb 4.8 oz (60.464 kg). Her temperature is 98.1 F (36.7 C). Her blood pressure is 123/60 and her pulse is 68.  NAD, erythema in oropharynx, no thrush, no skin changes  Impression:  The patient is tolerating radiotherapy.  Plan:  Continue radiotherapy as planned.  Elnita Maxwell looking into [facility] for MMW. Advised use of Hycet q4hrs prn pain. Sucralfate PRN. Will get in touch with SW, nutrition, Swallowing PT re: compliance issues.  ________________________________   Lonie Peak, M.D.

## 2012-05-26 ENCOUNTER — Ambulatory Visit
Admission: RE | Admit: 2012-05-26 | Discharge: 2012-05-26 | Disposition: A | Discharge status: Home / Self Care | Payer: Medicare Other | Source: Ambulatory Visit | Attending: Radiation Oncology | Admitting: Radiation Oncology

## 2012-05-27 ENCOUNTER — Ambulatory Visit
Admission: RE | Admit: 2012-05-27 | Discharge: 2012-05-27 | Disposition: A | Discharge status: Home / Self Care | Payer: Medicare Other | Source: Ambulatory Visit | Attending: Radiation Oncology | Admitting: Radiation Oncology

## 2012-05-30 ENCOUNTER — Ambulatory Visit
Admission: RE | Admit: 2012-05-30 | Discharge: 2012-05-30 | Disposition: A | Discharge status: Home / Self Care | Payer: Medicare Other | Source: Ambulatory Visit | Attending: Radiation Oncology | Admitting: Radiation Oncology

## 2012-05-30 ENCOUNTER — Encounter: Payer: Self-pay | Admitting: Radiation Oncology

## 2012-05-30 VITALS — BP 126/61 | HR 80 | Temp 98.9°F | Ht 66.0 in | Wt 129.1 lb

## 2012-05-30 DIAGNOSIS — C030 Malignant neoplasm of upper gum: Secondary | ICD-10-CM

## 2012-05-30 NOTE — Progress Notes (Signed)
Kristine Morales has received 17 fractions to her Rt maxilla and bilateral neck regions.  She C/O sore throat and grades this as a level 8 on a scale of 0-10.  She states that she only takes her Hycet at night, and she does not take it prior to treatment because it makes her sleepy,  so encouraged her to take it at least twice daily, first time after treatment and she agreed.   Upon inspection her mouth and throat are moist and note redness of the uvula.  He oral mucosa is intact, but she states that at times her mouth and throat feel very dry.   She C/O of feeling , at times, that she can't hear well out of her right ear and wants this checked today.

## 2012-05-31 ENCOUNTER — Encounter: Payer: Self-pay | Admitting: Nutrition

## 2012-05-31 ENCOUNTER — Ambulatory Visit
Admission: RE | Admit: 2012-05-31 | Discharge: 2012-05-31 | Disposition: A | Discharge status: Home / Self Care | Payer: Medicare Other | Source: Ambulatory Visit | Attending: Radiation Oncology | Admitting: Radiation Oncology

## 2012-05-31 NOTE — Progress Notes (Signed)
   Weekly Management Note:  outpatient Current Dose:  40Gy  Projected Dose: 66 Gy   Narrative:  The patient presents for routine under treatment assessment.  CBCT/MVCT images/Port film x-rays were reviewed.  The chart was checked. Ms. Quigg  C/O sore throat and grades this as a level 8 on a scale of 0-10. She states that she only takes her Hycet at night, and she does not take it prior to treatment because it makes her sleepy, so encouraged her to take it at least twice daily, first time after treatment and she agreed.  She states that at times her mouth and throat feel very dry. She C/O of feeling , at times, that she can't hear well out of her right ear and wants this checked - feels like something is in it.  Physical Findings:  height is 5\' 6"  (1.676 m) and weight is 129 lb 1.6 oz (58.559 kg). Her temperature is 98.9 F (37.2 C). Her blood pressure is 126/61 and her pulse is 80.  Upon inspection her mouth and throat are moist and note redness of the uvula. He oral mucosa is intact. No thrush. Right tympanic membrane unremarkable but partially occluded by cerumen  Impression:  The patient is tolerating radiotherapy.  Plan:  Continue radiotherapy as planned. Hycet refills are available at pharm - notified pt.  Rec'd Debrox for cerumen in ear canal.  ________________________________   Lonie Peak, M.D.

## 2012-06-01 ENCOUNTER — Ambulatory Visit
Admission: RE | Admit: 2012-06-01 | Discharge: 2012-06-01 | Disposition: A | Discharge status: Home / Self Care | Payer: Medicare Other | Source: Ambulatory Visit | Attending: Radiation Oncology | Admitting: Radiation Oncology

## 2012-06-02 ENCOUNTER — Telehealth: Payer: Self-pay | Admitting: *Deleted

## 2012-06-02 ENCOUNTER — Ambulatory Visit
Admission: RE | Admit: 2012-06-02 | Discharge: 2012-06-02 | Disposition: A | Discharge status: Home / Self Care | Payer: Medicare Other | Source: Ambulatory Visit | Attending: Radiation Oncology | Admitting: Radiation Oncology

## 2012-06-02 NOTE — Telephone Encounter (Signed)
Patient has been noncompliant with nutrition followups. I telephoned patient today and was able to speak with her over the phone. Patient reports she is having difficulty swallowing. She was unable to swallow liquid pain medication. She is drinking ensure 2-3 bottles daily. Weight was documented as 129 pounds on May 12 which is decreased from 143 pounds April 15. Patient reports she has a good understanding of how to administer Ensure Plus through her feeding tube. She states she has been doing this in the past occasionally but not consistently. Patient states today is the first day she has had an inability to swallow.  Nutrition diagnosis: Food and nutrition related knowledge deficit continues.  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  Intervention: Patient was educated to begin one can of Ensure Plus via PEG 4 times a day with 120 mL free water before and after each bolus feeding. Patient was encouraged to continue oral intake as tolerated. Tube feedings will provide 1400 calories, 52 g protein, 1680 cc free water. This is approximately 75% of patient's caloric needs. Patient able to teach back bolus tube feeding delivery.  Monitoring, evaluation, goals: Patient will tolerate bolus tube feedings utilizing Ensure Plus to meet greater than 75% of caloric needs. Patient will continue oral intake as tolerated. Tube feedings will be adjusted based on oral intake as well as weight changes.  Next visit: Patient agrees to contact me with questions. I will attempt to followup with her by telephone if necessary to assess tube feeding tolerance and compliance.

## 2012-06-02 NOTE — Telephone Encounter (Signed)
Received a call from Kristine Apple, RN, who stated that a Kristine Morales nurse reported that Kristine Morales now has a very sore mouth with blisters and questionable thrush.  Kristine Morales states that when she attempted to eat mashed potatoes today it was very painful. She states that she was not able to afford her Magic Mouthwash because her insurance will not cover it and it cost $85.00. In reviewing her record it is noted that when her medications were reviewed on 04/30/12 she reported that she was taking Magic Mouthwash.  Called her pharmacy,[facility] [address], and the pharmacist was able to resubmit the medication and it now covered by Medicare at a cost of $37.56.  Kristine Morales informed of the reduced cost and she stated she will have her family pick up the script this evening.  Per the pharmacy, she was instructed to wait at least one hour before picking up the Magic Mouthwash.  Also requested that she come to nursing after her treatment on tomorrow for an assessment in nursing and she agreed.

## 2012-06-02 NOTE — Telephone Encounter (Signed)
Rec'd voicemail from Benjamin Perez with [facility] nursing calling in for patient to report that patient is unable to eat today due to mouth sores. Also has white coated tongue and blisters, very difficult to swallow. Eunice Blase states that we were trying to get patient financial assistance to get her "some mouthwash". Med Onc is unaware of these issues, however, patient is currently under treatment with Dr Karoline Caldwell for Radiation. Will forward this information to appropriate area for further decisions/plan of care. If Eunice Blase needs to be reached, please call her.  Also of note/fyi, she states patient husband has health issues and was admitted to hospital yesterday.

## 2012-06-03 ENCOUNTER — Ambulatory Visit
Admission: RE | Admit: 2012-06-03 | Discharge: 2012-06-03 | Disposition: A | Discharge status: Home / Self Care | Payer: Medicare Other | Source: Ambulatory Visit | Attending: Radiation Oncology | Admitting: Radiation Oncology

## 2012-06-03 VITALS — BP 127/55 | HR 101 | Temp 98.9°F | Resp 20 | Wt 128.6 lb

## 2012-06-03 DIAGNOSIS — C030 Malignant neoplasm of upper gum: Secondary | ICD-10-CM

## 2012-06-03 NOTE — Progress Notes (Signed)
   Weekly Management Note:  outpatient Current Dose: 48Gy  Projected Dose: 66 Gy   Narrative:  The patient presents for routine under treatment assessment.  CBCT/MVCT images/Port film x-rays were reviewed.  The chart was checked. Increased mouth pain. Did not fill MMW due to $ issues. ? Thrush in mouth per [facility] nurse.  Physical Findings:  weight is 128 lb 9.6 oz (58.333 kg). Her temperature is 98.9 F (37.2 C). Her blood pressure is 127/55 and her pulse is 101. Her respiration is 20 and oxygen saturation is 100%.  perhaps scant thrush, hard to tell. Will follow.  Mucositis is patchy in mouth.  Impression:  The patient is tolerating radiotherapy.  Plan:  Continue radiotherapy as planned. MMW rx is now affordable after clarifying pharmacy calls made by nursing.  Pt will fill Rx.  It has nystatin in it, in case of thrush. Told pt to use QID - swish 30sec at a time, and swallow.  Will check again on Mon - if thrush is obvious, can consider other meds.   Appreciate input from nutrition in light of wt loss.  ________________________________   Lonie Peak, M.D.

## 2012-06-06 ENCOUNTER — Ambulatory Visit
Admission: RE | Admit: 2012-06-06 | Discharge: 2012-06-06 | Disposition: A | Discharge status: Home / Self Care | Payer: Medicare Other | Source: Ambulatory Visit | Attending: Radiation Oncology | Admitting: Radiation Oncology

## 2012-06-06 ENCOUNTER — Encounter: Payer: Self-pay | Admitting: Nutrition

## 2012-06-06 ENCOUNTER — Encounter: Payer: Self-pay | Admitting: Radiation Oncology

## 2012-06-06 VITALS — BP 136/56 | HR 83 | Temp 98.1°F | Ht 66.0 in | Wt 126.7 lb

## 2012-06-06 DIAGNOSIS — B370 Candidal stomatitis: Secondary | ICD-10-CM

## 2012-06-06 DIAGNOSIS — C030 Malignant neoplasm of upper gum: Secondary | ICD-10-CM

## 2012-06-06 MED ORDER — FLUCONAZOLE 10 MG/ML PO SUSR: ORAL | Status: DC

## 2012-06-06 NOTE — Progress Notes (Signed)
   Weekly Management Note:  outpatient Current Dose:  50 Gy  Projected Dose: 66 Gy   Narrative:  The patient presents for routine under treatment assessment.  CBCT/MVCT images/Port film x-rays were reviewed.  The chart was checked. Her oral mucosa is moist, but note whitish areas on lower anterior gums, in the anterior region of the right buccal mucosal region and the back of her throat. Ms. Jezewski started using Magic Mouthwash on Friday of last week and she reports today that her pain on swallowing is "not as bad" as last week. Her Peg tube site is clear and she instills ~ 3 cans of enteral nutrition daily and she is only drinking liquids by mouth. She is not taking many of her pills, including colchicine and lovastatin.  Physical Findings:  height is 5\' 6"  (1.676 m) and weight is 126 lb 11.2 oz (57.471 kg). Her temperature is 98.1 F (36.7 C). Her blood pressure is 136/56 and her pulse is 83.  more of a while film over lower gums and right buccal mucosa. Possibly thrush.    Impression:  The patient is tolerating radiotherapy.  Plan:  Continue radiotherapy as planned.  Feels better with MMW.  Take Hycet as needed.  Rx fluconazole for thrush, hold colchicine and lovastatin while on this.  ________________________________   Lonie Peak, M.D.

## 2012-06-06 NOTE — Progress Notes (Signed)
Kristine Morales has received 22 fractions to her right maxilla, and bilateral neck regions.  Her oral mucosa is moist, but note whitish areas on lower anterior gums, in the anterior region of the right buccal mucosal region and the back of her throat. Kristine Morales started using Magic Mouthwash on Friday of last week and she reports today that her pain on swallowing is "not as bad" as last week.   Her Peg tube site is clear and she instills ~ 3 cans of enteral nutrition daily and she is only drinking liquids by mouth.

## 2012-06-07 ENCOUNTER — Ambulatory Visit
Admission: RE | Admit: 2012-06-07 | Discharge: 2012-06-07 | Disposition: A | Discharge status: Home / Self Care | Payer: Medicare Other | Source: Ambulatory Visit | Attending: Radiation Oncology | Admitting: Radiation Oncology

## 2012-06-08 ENCOUNTER — Ambulatory Visit
Admission: RE | Admit: 2012-06-08 | Discharge: 2012-06-08 | Disposition: A | Discharge status: Home / Self Care | Payer: Medicare Other | Source: Ambulatory Visit | Attending: Radiation Oncology | Admitting: Radiation Oncology

## 2012-06-08 ENCOUNTER — Ambulatory Visit: Payer: Medicare Other | Admitting: Nutrition

## 2012-06-08 NOTE — Progress Notes (Signed)
I called patient on the telephone to speak with her regarding tube feedings. Patient states she is no longer able to eat by mouth. Her mouth is sore. She can drink water however, she cannot taste foods or liquids. She states there were 2 days where she was able to put 3 cans of Ensure Plus in her feeding tube. She now complains of nausea and has only been tolerating 1 to 1-1/2 cans of Ensure Plus via feeding tube. Weight has decreased to 126 pounds from 129 pounds on May 12.  Nutrition diagnosis: Food and nutrition related knowledge deficit continues. Diagnosis of unintended weight loss also continues.  Intervention: Patient was educated to increase Ensure Plus via PEG to four times a day with 120 mL free water before and after each bolus feeding. Tube feedings will only provide approximately 75% of patient's caloric needs. This is 1400 calories, 52 g protein, and 1680 cc free water. Patient is agreeable to increasing Ensure Plus via PEG. She was able to teach back tube feeding goal.  Monitoring, evaluation, goals: Patient has been unable to increase oral intake or tolerate bolus tube feeding 4 times a day. Patient has continued weight loss. She will work to increase bolus feedings to 4 times a day. I will recommend increase in tube feedings once patient is tolerating current goal.  Next visit: I will continue to work with patient over the telephone. She was encouraged to contact me if she has any questions or concerns prior to our next follow up.

## 2012-06-09 ENCOUNTER — Ambulatory Visit
Admission: RE | Admit: 2012-06-09 | Discharge: 2012-06-09 | Disposition: A | Discharge status: Home / Self Care | Payer: Medicare Other | Source: Ambulatory Visit | Attending: Radiation Oncology | Admitting: Radiation Oncology

## 2012-06-10 ENCOUNTER — Ambulatory Visit
Admission: RE | Admit: 2012-06-10 | Discharge: 2012-06-10 | Disposition: A | Discharge status: Home / Self Care | Payer: Medicare Other | Source: Ambulatory Visit | Attending: Radiation Oncology | Admitting: Radiation Oncology

## 2012-06-14 ENCOUNTER — Ambulatory Visit
Admission: RE | Admit: 2012-06-14 | Discharge: 2012-06-14 | Disposition: A | Discharge status: Home / Self Care | Payer: Medicare Other | Source: Ambulatory Visit | Attending: Radiation Oncology | Admitting: Radiation Oncology

## 2012-06-14 ENCOUNTER — Encounter: Payer: Self-pay | Admitting: Radiation Oncology

## 2012-06-14 VITALS — BP 125/57 | HR 92 | Temp 98.0°F | Ht 66.0 in | Wt 121.6 lb

## 2012-06-14 DIAGNOSIS — C030 Malignant neoplasm of upper gum: Secondary | ICD-10-CM

## 2012-06-14 NOTE — Progress Notes (Signed)
Kristine Morales has received 30 fractions to her right maxilla and Bilateral Neck.  note dry desquamation on the right lateral neck anterior neck, under her chin and left lower neck region.   She is currently using biafine and given new tube today.  She C/O sore throat upon swallowing and has lost ~ 5 lbs since 06/06/12.  [name] revealed that she is only instilling 3 cans of enteral nutrition via her enteral tube instead of the 4 cans suggested by the [facility] dietician.  Talked with her an her daughter regarding the importance of her getting enough nutrition onboard and how this will increase her energy level and aid in tissue cell repair.  Peg tube site clear.  Unable to view oral mucosa due to difficulty opening her mouth, but she states she is doing her exercises

## 2012-06-14 NOTE — Progress Notes (Signed)
   Weekly Management Note:  outpatient Current Dose:  60 Gy  Projected Dose: 66 Gy   Narrative:  The patient presents for routine under treatment assessment.  CBCT/MVCT images/Port film x-rays were reviewed.  The chart was checked. She is currently using biafine and given new tube today. She C/O sore throat upon swallowing and has lost ~ 5 lbs since 06/06/12. Ms. Rahn revealed that she is only instilling 3 cans of enteral nutrition via her enteral tube instead of the 4 cans suggested by the [facility] dietician.  Hycet for pain, BID, knows she can use this more. Also using MMW.  Physical Findings:  height is 5\' 6"  (1.676 m) and weight is 121 lb 9.6 oz (55.157 kg). Her temperature is 98 F (36.7 C). Her blood pressure is 125/57 and her pulse is 92.  dry desquamation on the right lateral neck anterior neck, under her chin and left lower neck region.  Hyperpigmented neck.  Impending moist desquamation in skin creases. Confluent mucositis in oral cavity.  Impression:  The patient is tolerating radiotherapy.  Plan:  Continue radiotherapy as planned. My nurse and I talked with her an her daughter regarding the importance of her getting enough nutrition onboard and how this will increase her energy level and aid in tissue cell repair.  Triple antibiotic ointment for the impending moist desquamation. Pain control adequate. F/u card given for 3 wks.   ________________________________   Lonie Peak, M.D.

## 2012-06-15 ENCOUNTER — Ambulatory Visit
Admission: RE | Admit: 2012-06-15 | Discharge: 2012-06-15 | Disposition: A | Discharge status: Home / Self Care | Payer: Medicare Other | Source: Ambulatory Visit | Attending: Radiation Oncology | Admitting: Radiation Oncology

## 2012-06-15 ENCOUNTER — Encounter: Payer: Self-pay | Admitting: Nutrition

## 2012-06-16 ENCOUNTER — Ambulatory Visit: Payer: Medicare Other

## 2012-06-16 ENCOUNTER — Ambulatory Visit
Admission: RE | Admit: 2012-06-16 | Discharge: 2012-06-16 | Disposition: A | Discharge status: Home / Self Care | Payer: Medicare Other | Source: Ambulatory Visit | Attending: Radiation Oncology | Admitting: Radiation Oncology

## 2012-06-17 ENCOUNTER — Ambulatory Visit: Payer: Medicare Other

## 2012-06-17 ENCOUNTER — Encounter: Payer: Self-pay | Admitting: Radiation Oncology

## 2012-06-17 ENCOUNTER — Ambulatory Visit
Admission: RE | Admit: 2012-06-17 | Discharge: 2012-06-17 | Disposition: A | Discharge status: Home / Self Care | Payer: Medicare Other | Source: Ambulatory Visit | Attending: Radiation Oncology | Admitting: Radiation Oncology

## 2012-06-17 VITALS — BP 148/78 | HR 98 | Temp 98.1°F | Ht 66.0 in | Wt 122.0 lb

## 2012-06-17 DIAGNOSIS — C030 Malignant neoplasm of upper gum: Secondary | ICD-10-CM

## 2012-06-17 DIAGNOSIS — C069 Malignant neoplasm of mouth, unspecified: Secondary | ICD-10-CM

## 2012-06-17 MED ORDER — BIAFINE EX EMUL
CUTANEOUS | Status: DC | PRN
  Administered 2012-06-17: 12:00:00 via TOPICAL

## 2012-06-17 NOTE — Progress Notes (Signed)
   Weekly Management Note Completed Radiotherapy. Total Dose:  66Gy   Narrative:  The patient presents for routine under treatment assessment on last day of radiotherapy.  CBCT/MVCT images/Port film x-rays were reviewed.  The chart was checked.  Completed RT today. Feels about the same.  No new complaints. Happy to be done. Wt up one lb.   Physical Findings:  height is 5\' 6"  (1.676 m) and weight is 122 lb (55.339 kg). Her temperature is 98.1 F (36.7 C). Her blood pressure is 148/78 and her pulse is 98.  Thickened saliva, diffuse oral mucositis. No thrush. Skin stable  Impression:  The patient has tolerated radiotherapy.  Plan:  Routine follow-up in one half month. Rec'd she call [facility] to schedule f/u with them. ________________________________   Lonie Peak, M.D.

## 2012-06-17 NOTE — Progress Notes (Signed)
Ms. sukup has completed radiation.  She states that it is painful to swallow foods and is solely using her PEG feeding tube for her nutritional needs.  Diffuse hyperpigmentation in the treatment field with dry desqumation in the lower neck regions.  She c/o sore mouth , but her mucosa is intact.  She reports that she sleeps "a lot".  Accompanied by her son today.

## 2012-06-20 ENCOUNTER — Telehealth: Payer: Self-pay | Admitting: Nutrition

## 2012-06-20 NOTE — Telephone Encounter (Signed)
I followed up with patient on the telephone regarding tube feedings. Patient reports if she takes her time she can infuse 2-1/2 bottles of Ensure Plus at a time so she "will not have to give too many feedings."  She will do this once or twice a day. She states she uses one syringe of water after bolus feedings. She is drinking water throughout the day. Patient is unable to eat any food at this time. She denies nutrition impact symptoms. I am unable to document weight changes. Patient reports that her brother has passed away and she has been in and out of the house all weekend so has been unable to comply with tube feeding recommendations.  Nutrition diagnosis: Food and nutrition related knowledge deficit continues. Diagnosis of unintended weight loss cannot be evaluated.  Intervention: I educated patient to increase bolus feedings to 3 times a day. I've encouraged no more than 2 bottles of ensure at one time. I educated her that she could give one bottle once a day and 2 bottles twice a day to achieve goal rate of 5 bottles of Ensure Plus daily. She is to flush her feeding tube with 120 mL free water before and after each bolus feeding. This will provide 1750 calories, 65 g protein, and 1620 mL free water. She will receive additional free water by drinking water throughout the day. Patient able to repeat back tube feeding instructions. Patient verbalizes agreement to increase tube feedings.  Monitoring, evaluation, goals: Patient continues to be noncompliant with bolus tube feeding instructions. I'm unable able to evaluate her weight change. She will work to increase bolus feedings to 3 times daily with the goal of 5 cans of Ensure Plus daily.  Next visit: I will call patient again next week to evaluate tolerance and compliance. I've encouraged her to contact me prior to this if she has questions or concerns.

## 2012-06-20 NOTE — Progress Notes (Signed)
  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  She repeatedly no-showed for her appointments in medical oncology despite efforts from the team (including social work) to facilitate re-consultation for chemotherapy.  She did not end up receiving chemotherapy concurrently.   Plan: The patient has completed radiation treatment. The patient will return to radiation oncology clinic for routine followup in one month. I advised them to call or return sooner if they have any questions or concerns related to their recovery or treatment.  -----------------------------------  Lonie Peak, MD

## 2012-06-29 ENCOUNTER — Other Ambulatory Visit: Payer: Self-pay | Admitting: Oncology

## 2012-07-01 ENCOUNTER — Telehealth: Payer: Self-pay | Admitting: Nutrition

## 2012-07-01 ENCOUNTER — Encounter: Payer: Self-pay | Admitting: Radiation Oncology

## 2012-07-01 ENCOUNTER — Ambulatory Visit
Admission: RE | Admit: 2012-07-01 | Discharge: 2012-07-01 | Disposition: A | Discharge status: Home / Self Care | Payer: Medicare Other | Source: Ambulatory Visit | Attending: Radiation Oncology | Admitting: Radiation Oncology

## 2012-07-01 VITALS — BP 138/59 | HR 102 | Temp 98.7°F | Ht 66.0 in | Wt 118.6 lb

## 2012-07-01 DIAGNOSIS — Z7982 Long term (current) use of aspirin: Secondary | ICD-10-CM | POA: Insufficient documentation

## 2012-07-01 DIAGNOSIS — C030 Malignant neoplasm of upper gum: Secondary | ICD-10-CM

## 2012-07-01 DIAGNOSIS — I890 Lymphedema, not elsewhere classified: Secondary | ICD-10-CM | POA: Insufficient documentation

## 2012-07-01 DIAGNOSIS — Z79899 Other long term (current) drug therapy: Secondary | ICD-10-CM | POA: Insufficient documentation

## 2012-07-01 DIAGNOSIS — L819 Disorder of pigmentation, unspecified: Secondary | ICD-10-CM | POA: Insufficient documentation

## 2012-07-01 DIAGNOSIS — Z931 Gastrostomy status: Secondary | ICD-10-CM | POA: Insufficient documentation

## 2012-07-01 DIAGNOSIS — K121 Other forms of stomatitis: Secondary | ICD-10-CM | POA: Insufficient documentation

## 2012-07-01 MED ORDER — RADIAPLEXRX EX GEL
Freq: Once | CUTANEOUS | Status: AC
  Administered 2012-07-01: 17:00:00 via TOPICAL

## 2012-07-01 NOTE — Telephone Encounter (Signed)
I spoke with patient on telephone who reports she continues to only tolerate 4 bottles of Ensure Plus daily. She states her throat is not as sore as it used to be and she is now trying to drink Ensure Plus by mouth. Patient reports she drank 2 bottles of Ensure Plus by mouth yesterday. She is starting to drink some other liquids and would like to increase her oral intake.  Nutrition diagnosis: Food and nutrition related knowledge deficit continues. Diagnosis of of unintended weight loss cannot be evaluated.  Intervention: I've educated patient on strategies for increasing oral intake using liquids and soft moist foods. I have educated her to try to increase Ensure Plus to 5 bottles a day either by mouth or via tube as needed to promote weight stabilization. I've answered patient's questions. Teach back method used.  Monitoring, evaluation, goals: Patient will continue to tolerate Ensure Plus the day or by mouth along with increasing oral intake to promote weight stabilization.  Next visit: Patient was encouraged to continue to contact me with questions or concerns. I will continue to follow patient as needed.

## 2012-07-01 NOTE — Progress Notes (Signed)
Radiation Oncology         (336) [phone] ________________________________  Name: Kristine Morales MRN: [id number]  Date: 07/01/2012  DOB: [birth year]  Follow-Up Visit Note  CC: Provider Not In System  No ref. 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  She is doing a little better, but still losing some weight.  In touch with Vernell Leep by phone.  She is starting to tolerate PO intake better, which    I am hoping will facilitate better weight gain as she has struggled with her PEG tube. She still uses hydrocodone elixir for pain PRN but is not on a Duragesic patch. She still has hyperpigmentation and dry desquamation of the skin of her face and her neck. She is using radiaplex for this. She senses some heaviness in her right neck.                        ALLERGIES:  has No Known Allergies.  Meds: Current Outpatient Prescriptions  Medication Sig Dispense Refill  . ALPHAGAN P 0.1 % SOLN Place 1 drop into both eyes 2 (two) times daily.       . Alum & Mag Hydroxide-Simeth (MAGIC MOUTHWASH W/LIDOCAINE) SOLN 1part nystatin,1part Maaloxplus,1part benadryl,3part 2%viscous lidocaine. Swish/swallow 10 mL up to QID, before meals/bedtime  480 mL  5  . aspirin 81 MG tablet Take 81 mg by mouth daily.       . colchicine 0.6 MG tablet Take 0.6 mg by mouth daily.      . dorzolamide-timolol (COSOPT) 22.3-6.8 MG/ML ophthalmic solution Place 1 drop into both eyes 2 (two) times daily.       . fluconazole (DIFLUCAN) 10 MG/ML suspension Flush 20mL into PEG today, then 10mL daily for 20 more days. Don't take Lovastatin/Colchine while on this drug.  240 mL  0  . HYDROcodone-acetaminophen (HYCET) 7.5-325 mg/15 ml solution Take 15  mLs by mouth every 4 (four) hours as needed for pain.  473 mL  3  . letrozole (FEMARA) 2.5 MG tablet Take 1 tablet (2.5 mg total) by mouth daily.  30 tablet  11  . lovastatin (ALTOPREV) 40 MG 24 hr tablet Take 40 mg by mouth at bedtime.      . nebivolol (BYSTOLIC) 5 MG tablet Take 1 tablet (5 mg total) by mouth daily.  30 tablet  3  . Petrolatum OINT Apply to thigh daily      . sodium fluoride (FLUORISHIELD) 1.1 % GEL dental gel Instill one drop of fluoride per tooth space of fluoride tray. Place over teeth for 5 minutes. Remove. Spit out excess. Repeat nightly.  120 mL  prn  . sucralfate (CARAFATE) 1 G tablet Crush 1 tablet in 10 mL H20 and swallow 30 min prior to meals and bedtime.  30 tablet  5  . White Petrolatum (PETROLATUM WHITE) OINT Apply to thigh daily       No current facility-administered medications for this encounter.    Physical Findings: The patient is in no acute distress. Patient is alert and oriented.  height is 5\' 6"  (1.676 m) and weight is 118 lb 9.6 oz (53.797 kg). Her temperature is 98.7 F (37.1 C). Her blood pressure is 138/59 and  her pulse is 102. Marland Kitchen Persistent diffuse mucositis in her mouth but no evidence of thrush, no bleeding. She does have some modest lymphedema in the right neck. Diffuse dry desquamation and hyperpigmentation throughout the neck and face  Lab Findings: Lab Results  Component Value Date   WBC 6.0 05/03/2012   HGB 8.5* 05/03/2012   HCT 27.0* 05/03/2012   MCV 77.1* 05/03/2012   PLT 323 05/03/2012    CMP     Component Value Date/Time   NA 140 05/03/2012 1205   NA 139 02/09/2012 0919   K 4.4 05/03/2012 1205   K 3.9 02/09/2012 0919   CL 103 05/03/2012 1205   CL 104 02/09/2012 0919   CO2 24 05/03/2012 1205   CO2 24 02/09/2012 0919   GLUCOSE 96 05/03/2012 1205   GLUCOSE 142* 02/09/2012 0919   BUN 13 05/03/2012 1205   BUN 25.0 02/09/2012 0919   CREATININE 0.81 05/03/2012 1205   CREATININE 1.1 02/09/2012 0919   CALCIUM 9.5 05/03/2012 1205   CALCIUM 10.0  02/09/2012 0919   PROT 7.7 02/09/2012 0919   PROT 7.4 02/09/2011 1011   ALBUMIN 3.4* 02/09/2012 0919   ALBUMIN 4.4 02/09/2011 1011   AST 19 02/09/2012 0919   AST 35 02/09/2011 1011   ALT 11 02/09/2012 0919   ALT 12 02/09/2011 1011   ALKPHOS 101 02/09/2012 0919   ALKPHOS 80 02/09/2011 1011   BILITOT 0.76 02/09/2012 0919   BILITOT 0.7 02/09/2011 1011   GFRNONAA 68* 05/03/2012 1205   GFRAA 79* 05/03/2012 1205     Radiographic Findings: No results found.  Impression/Plan:    1) Head and Neck Cancer Status: Healing from radiotherapy  2) Nutritional Status: - weight: Loss of 4 pound since completion of radio - PEG tube: She has struggled with using this. Encouraged her to steadily increase her oral intake while still using her PEG  3) Risk Factors: The patient has been educated about risk factors including alcohol and tobacco abuse; they understand that avoidance of alcohol and tobacco is important to prevent recurrences as well as other cancers  4) Swallowing: Patient denies any significant dysphagia at this time  5) Dental: Encouraged to continue regular followup with dentistry, and dental hygiene including fluoride rinses.   6) Energy: Reasonable for this point of healing  7) Social: No active social issues to address at this time  8) other: Radiaplex provided for further healing of her skin. She has some mild lymphedema which can be addressed with physical therapy if it has not resolved after we get the results of a post treatment PET/CT  9) Follow-up in 3months with PET/CT scan at that time. The patient was encouraged to call with any issues or questions before then.  I spent 25 minutes minutes face to face with the patient and more than 50% of that time was spent in counseling and/or coordination of care. _____________________________________   Lonie Peak, MD

## 2012-07-01 NOTE — Progress Notes (Signed)
Ms. Manka here for assessment following radiation therapy for squamous cell cancer of the maxillary alveolar ridge.  She denies any pain.  Her moth is moist and no evidence of muositis.  Her Face and neck with dry desquamation and hyperpigmentation.  She reports that she has nausea when she does bolus feeds, so will explore continuous pump feedings with the dietician, Vernell Leep.    She has lost 4 lbs since her last weight. She is accompanied by her daughter.

## 2012-07-04 ENCOUNTER — Telehealth: Payer: Self-pay | Admitting: *Deleted

## 2012-07-04 NOTE — Telephone Encounter (Signed)
Called patient to inform of tests on 09-23-12, spoke with patient and informed of both tests and restrictions for the tests, per Radiology instruction.

## 2012-07-14 ENCOUNTER — Other Ambulatory Visit: Payer: Self-pay | Admitting: Radiation Oncology

## 2012-07-16 ENCOUNTER — Telehealth: Payer: Self-pay | Admitting: *Deleted

## 2012-07-16 NOTE — Telephone Encounter (Addendum)
Called in refill ordered  By Dr. Dayton Scrape for Hydrocodone ( Hycet) refill 7.5/ 325 mg/15 ml : Take 15 mls po Q 4hrs prn pain. [facility] on [address].  Called at 0800.  Called and informed her husband

## 2012-07-20 ENCOUNTER — Telehealth: Payer: Self-pay | Admitting: Nutrition

## 2012-07-20 ENCOUNTER — Other Ambulatory Visit (HOSPITAL_COMMUNITY): Payer: Self-pay | Admitting: Dentistry

## 2012-07-20 NOTE — Telephone Encounter (Signed)
Called patient on the telephone, who reports she is not really eating by mouth.  She states her gums are pretty sore.  She is drinking fluids by mouth and states she has not used her feeding tube "in a while."  She is drinking Ensure Plus 3 times a day.  She states her weight is now about 121 pounds on home scale.  Patient reports she is having some loose stools.  She states she plans on calling the doctor today if this continues.  Patient was encouraged to increase Ensure Plus and other liquids to provide adequate calories and protein.  Patient is agreeable to trying.  Patient encouraged to contact me with further questions or concerns.

## 2012-07-21 ENCOUNTER — Encounter: Payer: Self-pay | Admitting: Otolaryngology

## 2012-07-25 ENCOUNTER — Telehealth: Payer: Self-pay | Admitting: *Deleted

## 2012-07-25 NOTE — Telephone Encounter (Addendum)
Returned Ms. Dorsetts' call.  She reports that she experienced diarrhea "several times" on  Thursday and Friday.  She reported that she is drinking 3 Ensure Plus Daily, in addition to boost, and is mixing the two supplements.   She did not drink any supplements at all on Saturday and her diarrhea stoped, but resumed on Sunday with resurgence of diarrhea.  She notes that when she drinks the Ensure plus she does not have diarrhea, but when she adds Boost, the diarrhea resumes.  Suggested she only drink Ensure plus today and she agreed  Also encouraged her to drink more fluids to remain hydrated to prevent dehydration.  Asked her to please call if her diarrhea resumes and to take Imodium, liquid or pills as per package direction.  Called and discussed above with the Dietician, Ebbie Latus on call for [employer].   Will call later today.

## 2012-07-26 ENCOUNTER — Telehealth: Payer: Self-pay | Admitting: *Deleted

## 2012-07-26 ENCOUNTER — Other Ambulatory Visit (HOSPITAL_COMMUNITY): Payer: Self-pay | Admitting: Dentistry

## 2012-07-26 NOTE — Telephone Encounter (Signed)
Called [name] this am and discovered that she did not take any Imodium as instructed yesterday because she ".did not remember being instructed to take it."  She reports to having several small loose stools on yesterday and this am after restarting her Ensure Plus.  She has been on Ensure Plus since April of 2014 and this weekend was the first time she experienced any loose stools/diarrhea.  Again, she was encouraged to please take Imodium and she stated her husband or daughter would bring it after they left work today at [employer].  She revealed that she had Pepto-Bismal, so suggested she take it and she agreed.  When called at 6:25pm she stated she had just taken a dose of Pepto Bismal at 5 pm and that her abdomen was no longer cramping.  She also stated that she now has Imodium and will start taking it today as directed on the packaging when she has a loose stool/diarrhea.  Informed her that she would receive a call in the AM from this RN.Marland Kitchen

## 2012-07-27 ENCOUNTER — Encounter: Payer: Self-pay | Admitting: *Deleted

## 2012-07-28 ENCOUNTER — Ambulatory Visit: Payer: Medicare Other | Admitting: Internal Medicine

## 2012-08-08 ENCOUNTER — Other Ambulatory Visit: Payer: Self-pay | Admitting: Radiation Oncology

## 2012-08-08 ENCOUNTER — Encounter: Payer: Self-pay | Admitting: Internal Medicine

## 2012-08-08 ENCOUNTER — Ambulatory Visit (INDEPENDENT_AMBULATORY_CARE_PROVIDER_SITE_OTHER): Payer: Medicare Other | Admitting: Internal Medicine

## 2012-08-08 VITALS — BP 120/70 | HR 69 | Temp 98.7°F | Resp 16 | Ht 68.0 in | Wt 117.4 lb

## 2012-08-08 DIAGNOSIS — R197 Diarrhea, unspecified: Secondary | ICD-10-CM

## 2012-08-08 DIAGNOSIS — C030 Malignant neoplasm of upper gum: Secondary | ICD-10-CM

## 2012-08-08 DIAGNOSIS — C069 Malignant neoplasm of mouth, unspecified: Secondary | ICD-10-CM

## 2012-08-08 DIAGNOSIS — I10 Essential (primary) hypertension: Secondary | ICD-10-CM

## 2012-08-08 DIAGNOSIS — E785 Hyperlipidemia, unspecified: Secondary | ICD-10-CM

## 2012-08-08 DIAGNOSIS — F329 Major depressive disorder, single episode, unspecified: Secondary | ICD-10-CM

## 2012-08-08 DIAGNOSIS — F3289 Other specified depressive episodes: Secondary | ICD-10-CM

## 2012-08-08 DIAGNOSIS — F0631 Mood disorder due to known physiological condition with depressive features: Secondary | ICD-10-CM

## 2012-08-08 MED ORDER — MIRTAZAPINE 15 MG PO TBDP: 15.0000 mg | ORAL_TABLET | Freq: Every day | ORAL | Status: DC

## 2012-08-08 NOTE — Telephone Encounter (Signed)
Refill need to be authorized by Dr. Lonie Peak. I am not primary oncologist. RM

## 2012-08-08 NOTE — Progress Notes (Signed)
Patient ID: Kristine Morales, female   DOB: 1931-04-19, 79 y.o.   MRN: [id number] Location:  [facility]  Code Status: Need to discuss next visit.   No Known Allergies  Chief Complaint  Patient presents with  . Follow-up    HPI: Patient is a 79 y.o. black female seen in the office today for med mgt chronic diseases Gums are not healing well.  Has to go back next month to [facility] about this.  Liquid diet gave her diarrhea so she was taken off that--stopped for a week and a half and started back.   Swallowing is ok except for pills.  Has to mash up the pills and take them that way.  Sometimes has steady loose stool all through day.  Yesterday not too bad.  Went 2x this am.  Gets some cramps with it.    Imodium is not helping.  Is on ensure but no longer on boost. Sometimes skips pills b/c she doens't want to get up to take them.  Feels tired and drained.    Mood is lousy.  Sometimes sleeps--has a pain medicine due to upper arm pain in left arm since graft was taken from forearm.  Spends a lot of time in bed.  Has lost so much weight that her right hip hurts when she lays in bed or sits a while.  She is tearful.  Review of Systems:  Review of Systems  Constitutional: Positive for weight loss and malaise/fatigue. Negative for fever and chills.  HENT: Negative for congestion and sore throat.   Eyes: Negative for blurred vision.  Respiratory: Negative for shortness of breath.   Cardiovascular: Negative for chest pain.  Gastrointestinal: Positive for diarrhea. Negative for heartburn, nausea, vomiting, abdominal pain, blood in stool and melena.  Genitourinary: Negative for dysuria.  Musculoskeletal: Positive for myalgias and joint pain. Negative for falls.  Skin: Negative for itching and rash.  Neurological: Positive for weakness. Negative for dizziness and headaches.  Psychiatric/Behavioral: Positive for depression. Negative for memory loss. The patient  has insomnia.      Past Medical History  Diagnosis Date  . Hypertension   . Hyperlipidemia   . Breast cancer, right breast 08/28/2009  . Use of letrozole (Femara) Since October 2012  . 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 Gout, unspecified   . 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 G tube feedings   . Wound infection after surgery 04/14/12    Opened and Packed - Wet to Dry  . Glaucoma   . Oral aphthae   . Complications affecting other specified body systems, hypertension   . Chronic angle-closure glaucoma(365.23)   . Pure hyperglyceridemia   . Allergic rhinitis due to pollen   . Malignant neoplasm of breast (female), unspecified site   . Thyroid disease   . Unspecified constipation   . Hypopotassemia   . Enlargement of lymph nodes   . Cough   . Cough   . Acute sinusitis, unspecified   . Other abnormal blood chemistry   . Unspecified vitamin D deficiency   . Other and unspecified hyperlipidemia   . Gout, unspecified   . Unspecified essential hypertension   . Unspecified arthropathy, lower leg   . Other abnormal blood chemistry     Past Surgical History  Procedure Laterality Date  . Simple mastectomy w/ sentinel node  biopsy Right 10/09/2009    Breast  . Soft tissue biopsy, right maxillary alveolus  02/12/2012    Invasive Squamous Cell  . Abdominal hysterectomy  1960's    BSO, bleeding  . Cataract surgery      bilat  . Tracheotomy and left selective neck dissection Left 03/18/12     [facility]: Left Selective Neck Dissection/Left Infrastructure Maxillectomy, Palatoplasty, Flap Fasciocutaneous Free Flap Reconstruction of Left Maxillary Defect. Split Thickness Skin Graft of Left Forearm Defect Measuring 10/14/cm  . Dobhoff tube placement  03/18/12  . Breast surgery      Social History:   reports that she quit smoking about 3 years ago. Her smoking use included Cigarettes.  She has a 2.25 pack-year smoking history. She has never used smokeless tobacco. She reports that  drinks alcohol. She reports that she does not use illicit drugs.  Family History  Problem Relation Age of Onset  . Heart disease Mother     CAD  . Stroke Mother   . Cancer Mother     Breast  . Cancer Sister     breast  . Heart attack Father   . Heart attack Sister   . Heart disease Sister   . Heart attack Brother     Medications: Patient's Medications  New Prescriptions   MIRTAZAPINE (REMERON SOLTAB) 15 MG DISINTEGRATING TABLET    Take 1 tablet (15 mg total) by mouth at bedtime.  Previous Medications   ALPHAGAN P 0.1 % SOLN    Place 1 drop into both eyes 2 (two) times daily.    ALUM & MAG HYDROXIDE-SIMETH (MAGIC MOUTHWASH W/LIDOCAINE) SOLN    1part nystatin,1part Maaloxplus,1part benadryl,3part 2%viscous lidocaine. Swish/swallow 10 mL up to QID, before meals/bedtime   ASPIRIN 81 MG TABLET    Take 81 mg by mouth daily.    COLCHICINE 0.6 MG TABLET    Take 0.6 mg by mouth daily.   DORZOLAMIDE-TIMOLOL (COSOPT) 22.3-6.8 MG/ML OPHTHALMIC SOLUTION    Place 1 drop into both eyes 2 (two) times daily.    FLUCONAZOLE (DIFLUCAN) 10 MG/ML SUSPENSION    Flush 20mL into PEG today, then 10mL daily for 20 more days. Don't take Lovastatin/Colchine while on this drug.   HYDROCODONE-ACETAMINOPHEN (HYCET) 7.5-325 MG/15 ML SOLUTION    TAKE BY MOUTH EVERY 4 HOURS AS NEEDED FOR PAIN   LETROZOLE (FEMARA) 2.5 MG TABLET    Take 1 tablet (2.5 mg total) by mouth daily.   LOVASTATIN (ALTOPREV) 40 MG 24 HR TABLET    Take 40 mg by mouth at bedtime.   NEBIVOLOL (BYSTOLIC) 5 MG TABLET    Take 1 tablet (5 mg total) by mouth daily.   PETROLATUM OINT    Apply to thigh daily   SODIUM FLUORIDE (FLUORISHIELD) 1.1 % GEL DENTAL GEL    Instill one drop of fluoride per tooth space of fluoride tray. Place over teeth for 5 minutes. Remove. Spit out excess. Repeat nightly.   SUCRALFATE (CARAFATE) 1 G TABLET    Crush 1  tablet in 10 mL H20 and swallow 30 min prior to meals and bedtime.   WHITE PETROLATUM (PETROLATUM WHITE) OINT    Apply to thigh daily  Modified Medications   No medications on file  Discontinued Medications   No medications on file     Physical Exam: Filed Vitals:   08/08/12 1341  BP: 120/70  Pulse: 69  Temp: 98.7 F (37.1 C)  TempSrc: Oral  Resp: 16  Height: 5\' 8"  (1.727  m)  Weight: 117 lb 6.4 oz (53.252 kg)  SpO2: 95%  Physical Exam  Constitutional: She is oriented to person, place, and time.  Cachectic AA female, tearful at times during visit  HENT:  Head: Normocephalic and atraumatic.  Graft visible on hard palate and posterior soft palate area on right, edentulous at this time  Cardiovascular: Normal rate, regular rhythm, normal heart sounds and intact distal pulses.   Pulmonary/Chest: Effort normal and breath sounds normal. No respiratory distress.  Abdominal: Soft. Bowel sounds are normal. She exhibits no distension. There is no tenderness.  Musculoskeletal: Normal range of motion. She exhibits no edema and no tenderness.  Neurological: She is alert and oriented to person, place, and time. No cranial nerve deficit.  Skin:  Left forearm scarring on palmar surface from graft  Psychiatric:  Tearful, affect quite depressed vs last visit    Labs reviewed: Basic Metabolic Panel:  Recent Labs  16/10/96 0919 05/03/12 1205  NA 139 140  K 3.9 4.4  CL 104 103  CO2 24 24  GLUCOSE 142* 96  BUN 25.0 13  CREATININE 1.1 0.81  CALCIUM 10.0 9.5   Liver Function Tests:  Recent Labs  02/09/12 0919  AST 19  ALT 11  ALKPHOS 101  BILITOT 0.76  PROT 7.7  ALBUMIN 3.4*  CBC:  Recent Labs  02/09/12 0919 04/26/12 1000 05/03/12 1205  WBC 6.3 6.4 6.0  NEUTROABS 3.9  --   --   HGB 9.3* 8.9* 8.5*  HCT 29.2* 28.2* 27.0*  MCV 80.5 78.3 77.1*  PLT 264 339 323   Assessment/Plan No problem-specific assessment & plan notes found for this encounter. 1. Diarrhea --due  to primarily liquid diet.   --recommended trying fiber supplement like metamucil or benefiber to make stools more solid--let me know if this remains problematic.   --hopefully, when she follows up with her surgeons at [facility], healing will be improved so she can progress with dentures and advance her diet b/c she continues losing weight with the current diet  2. 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 Malignant neoplasm of mouth --as above--notes poorly healing--hopefully, there is not residual cancer present at this point  4. Essential hypertension --at goal, no changes needed  5. Hyperlipidemia --suspect her lipids have normalized with her very minimal poor po intake  6. Other depression due to general medical condition --has poor appetite, spending much time in bed, losing motivation it seems to me.  Fortunately, she does have family and friend support. --start mirtazapine (REMERON SOLTAB) 15 MG disintegrating tablet; Take 1 tablet (15 mg total) by mouth at bedtime.  Dispense: 30 tablet; Refill: 3  Labs/tests ordered:  None today--getting labs from other physicians Next appt:  6 weeks to assess depression

## 2012-08-18 ENCOUNTER — Encounter: Payer: Self-pay | Admitting: Radiation Oncology

## 2012-08-19 ENCOUNTER — Encounter: Payer: Self-pay | Admitting: Radiation Oncology

## 2012-08-19 ENCOUNTER — Ambulatory Visit
Admission: RE | Admit: 2012-08-19 | Discharge: 2012-08-19 | Disposition: A | Discharge status: Home / Self Care | Payer: Medicare Other | Source: Ambulatory Visit | Attending: Radiation Oncology | Admitting: Radiation Oncology

## 2012-08-19 VITALS — BP 129/79 | HR 88 | Temp 97.7°F | Ht 68.0 in | Wt 116.0 lb

## 2012-08-19 DIAGNOSIS — C410 Malignant neoplasm of bones of skull and face: Secondary | ICD-10-CM

## 2012-08-19 DIAGNOSIS — C50911 Malignant neoplasm of unspecified site of right female breast: Secondary | ICD-10-CM

## 2012-08-19 HISTORY — DX: Personal history of irradiation: Z92.3

## 2012-08-19 MED ORDER — HYDROCODONE-ACETAMINOPHEN 7.5-325 MG/15ML PO SOLN: 15.0000 mL | Freq: Every evening | ORAL | Status: DC | PRN

## 2012-08-19 MED ORDER — LOPERAMIDE HCL 1 MG/5ML PO LIQD: 2.0000 mg | Freq: Four times a day (QID) | ORAL | Status: DC | PRN

## 2012-08-19 NOTE — Progress Notes (Signed)
Radiation Oncology         (336) [phone] ________________________________  Name: CHELSI WARR MRN: [id number]  Date: 08/19/2012  DOB: Mar 24, 1931  Follow-Up Visit Note  CC: [name], [name], DO  Corey Skains, MD  Diagnosis and Prior Radiotherapy:   pT4a N1 M0 squamous cell carcinoma of the maxillary alveolar ridge  Indication for treatment: Curative, post-op  Radiation treatment dates: 05/02/2012-06/17/2012  Site/dose: Right Maxillary Tumor Bed and Bilateral Neck / 66 Gy in 33 fractions  Narrative:  The patient returns today for routine follow-up.     Her main complaint is diarrhea. It can be up to every 15 minutes, small amounts at a time. She moved up her appointment a month early because of this. She is mainly taking in liquid nutrition predominantly through her PEG tube but some  by mouth. The patient and her daughter are poor historians. They're not quite sure what Rx she is using but they're pretty sure that she's been using Imodium twice a day. It does not sound like she has been using Metamucil or Benefiber as recommended by Dr. Renato Gails. The patient also reports trismus and some right neck pain and tightness.    She was recently started on Remeron for depression by Dr. Renato Gails, but has not been compliant with this.    Patient has difficulty with compliance, this is a chronic problem for her. No recent followup with medical oncology in light of her breast cancer and she has not had any recent followup with otolaryngology since completing radiotherapy. She reports that she has been using her fluoride rinses and also baking soda salt rinses.               ALLERGIES:  has No Known Allergies.  Meds: Current Outpatient Prescriptions  Medication Sig Dispense Refill  . ALPHAGAN P 0.1 % SOLN Place 1 drop into both eyes 2 (two) times daily.       Marland Kitchen aspirin 81 MG tablet Take 81 mg by mouth daily.       . colchicine 0.6 MG tablet Take 0.6 mg by mouth daily.      . dorzolamide-timolol (COSOPT) 22.3-6.8  MG/ML ophthalmic solution Place 1 drop into both eyes 2 (two) times daily.       Marland Kitchen HYDROcodone-acetaminophen (HYCET) 7.5-325 mg/15 ml solution Take 15 mLs by mouth at bedtime as needed for pain.  473 mL  0  . letrozole (FEMARA) 2.5 MG tablet Take 1 tablet (2.5 mg total) by mouth daily.  30 tablet  11  . mirtazapine (REMERON SOLTAB) 15 MG disintegrating tablet Take 1 tablet (15 mg total) by mouth at bedtime.  30 tablet  3  . Petrolatum OINT Apply to thigh daily      . sodium fluoride (FLUORISHIELD) 1.1 % GEL dental gel Instill one drop of fluoride per tooth space of fluoride tray. Place over teeth for 5 minutes. Remove. Spit out excess. Repeat nightly.  120 mL  prn  . White Petrolatum (PETROLATUM WHITE) OINT Apply to thigh daily      . loperamide (IMODIUM) 1 MG/5ML solution Take 10 mLs (2 mg total) by mouth 4 (four) times daily as needed for diarrhea or loose stools.  120 mL  5  . lovastatin (ALTOPREV) 40 MG 24 hr tablet Take 40 mg by mouth at bedtime.      . nebivolol (BYSTOLIC) 5 MG tablet Take 1 tablet (5 mg total) by mouth daily.  30 tablet  3   No current facility-administered medications for  this encounter.    Physical Findings: The patient is in no acute distress. Patient is alert and oriented.  height is 5\' 8"  (1.727 m) and weight is 116 lb (52.617 kg). Her temperature is 97.7 F (36.5 C). Her blood pressure is 129/79 and her pulse is 88. Marland Kitchen  Postsurgical changes in the neck bilaterally with no palpable adenopathy. Oral cavity and Oropharynx is notable for postsurgical and postradiation changes. No evidence of recurrence. +Trismus.  Lab Findings: Lab Results  Component Value Date   WBC 6.0 05/03/2012   HGB 8.5* 05/03/2012   HCT 27.0* 05/03/2012   MCV 77.1* 05/03/2012   PLT 323 05/03/2012    Radiographic Findings: No results found.  Impression/Plan:    1) Head and Neck Cancer Status: Healing from radiotherapy; NED; PET in Sept  2) Nutritional Status:  - weight: She continues to  lose some weight; I suspect the diarrhea is from her liquid nutrition. My RN will get in touch with our nutritionist to see if her nutritional supplements can be altered  - PEG tube: She still remains fairly dependent on this  3) Risk Factors: The patient has been educated about risk factors including alcohol and tobacco abuse; they understand that avoidance of alcohol and tobacco is important to prevent recurrences as well as other cancers; she reports she is abstinent  4) Swallowing: Patient denies any significant dysphagia at this time   5) Dental: Encouraged to continue regular followup with dentistry, and dental hygiene including fluoride rinses.   6) Energy: Reasonable for this point of healing   7) Trismus/ neck pain: Refer to PT; Hycet refilled  8) Diarrhea - get input from [employer] re: supplements.  Advised buying metamucil to use TID and wrote Rx with instructions for Imodium  9) Refer back to ENT Hezzie Bump, surgeon) and Med/Onc (breast cancer history)  10) Depression: Advised compliance with Remeron, this can take weeks to work  11) Social: Chronic issues with compliance and understanding medications/interventions; I will ask Dierdre Searles RN to call her next week to reiterate the instructions above  12) Follow-up in 1 month with PET/CT scan at that time. The patient was encouraged to call with any issues or questions before then. orm has been signed and placed in his chart  -----------------------------------  Lonie Peak, MD

## 2012-08-19 NOTE — Progress Notes (Addendum)
Kristine Morales here today for fu s/p radiation for Maxillary Alveolar Ridge cancer which completed on 06/17/12.  She states that despite performing her daily exercises to her jaw, she is having a pulling pain in her chin which radiates to her right jaw area and she notes decrease the range of motion in her jaw.  Note brown cast to her tongue and the right upper palate which has not changed since last visit.  More tightness noted in the jaw area on the right vs. The left and is more prominent, also.  She continues to have frequent, small loose stools and had 3 prior to coming today. She states that are about a capful in size and that she has no control over these occurrences.  Will call the dietician to determine if she should change her enteral nutrition.  PEG site is clear.

## 2012-08-23 ENCOUNTER — Encounter: Payer: Self-pay | Admitting: *Deleted

## 2012-08-23 ENCOUNTER — Other Ambulatory Visit: Payer: Self-pay | Admitting: *Deleted

## 2012-08-23 ENCOUNTER — Telehealth: Payer: Self-pay | Admitting: Nutrition

## 2012-08-23 ENCOUNTER — Telehealth: Payer: Self-pay | Admitting: *Deleted

## 2012-08-23 NOTE — Telephone Encounter (Signed)
I received a message to contact patient by phone.  I spoke with patient on the telephone who reports her diarrhea continues.  She states that she has small amounts of stool frequently throughout the day.  She has no control over her bowels.  She reports she is unable to eat much by mouth because her mouth is sore.  Her weight has decreased to 116 pounds August 1 from 118.6 pounds June 13.  Patient reports she is using medication prescribed by physician but it has not helped.  Revised Estimated nutrition needs: 1582-1845 calories, 70-80 g protein, 1.8 L fluid.  Nutrition diagnosis:  Food and nutrition related knowledge deficit and unintended weight loss both continue.  Intervention: Patient is to discontinue Ensure Plus.  I educated patient to begin Jevity 1.2, bolus tube feedings, with a goal of 6 cans daily.  Patient will begin with one can 3 times a day with 60 mL free water before and after the bolus feedings. She will increase one can daily until goal rate of 6 cans a day by Saturday of this week, if tolerated.  I will provide samples of Jevity 1.2 since patient cannot afford tube feeding formula.  Patient is to continue medications as prescribed.  She will also continue water by mouth as tolerated along with sources of soluble fiber to help thicken her stool.  One case of formula provided along with written instructions.  Patient's husband pick up formula tonight.  Teach back method used and patient verbalized understanding of new tube feeding schedule.    Tube feeding: Jevity 1.2, 6 cans daily, provides 1710 calories, 79 g protein, 1146 mL free water, and 25.8 g fiber.  With additional free water flushes patient will receive a total of 1866 mL free water daily. This is 100% of patient's estimated nutrition needs.  Monitoring, evaluation, goals: Patient will tolerate Jevity 1.2 at goal rate to resolve diarrhea, and minimize further weight loss.   Next visit: I will call patient and followup with  tube feeding tolerance.

## 2012-08-23 NOTE — Telephone Encounter (Signed)
Clydie Braun called to scheduled the pt an appt for after 11/09/12. gv appt for 11/16/12 @ 12noon. They will make the pt aware...td

## 2012-08-23 NOTE — Progress Notes (Signed)
Called pt to f/u on Dr. Colletta Maryland recommendations s/p 8/1 OV.  She stated she is still having diarrhea despite taking Metamucil and Immodium.  She said she has not heard from [facility] and I will f/u with Lesle Reek so that she can address nutritional practice.  Pt stated further that she has only taken 3 Remeron.  I reeducated her on need to take it q HS as prescribed.  Gave her my phone # and encouraged her to call me.  Will f/u next week.

## 2012-08-25 ENCOUNTER — Telehealth: Payer: Self-pay | Admitting: *Deleted

## 2012-08-25 NOTE — Telephone Encounter (Signed)
CALLED PATIENT TO INFORM OF APPTS., SPOKE WITH PATIENT AND SHE IS AWARE OF THESE APPTS.

## 2012-08-25 NOTE — Telephone Encounter (Signed)
xxxx 

## 2012-08-26 ENCOUNTER — Telehealth: Payer: Self-pay | Admitting: Nutrition

## 2012-08-26 NOTE — Telephone Encounter (Signed)
I spoke with patient on the telephone.  Patient reports she is doing much better since changing tube feeding to Jevity 1.2.  She has discontinued Ensure Plus.  She is advancing Jevity 1.2 to goal of 6 cans a day with 60 mL of free water before and after each bolus feeding.  She reports her diarrhea has improved.  She denies nutritional issues at this time.  She verbalizes again that she feels much better.  Patient is appreciative of nutrition interventions.  I have encouraged her to call me when her formula supply gets low, so that I can provide her with additional formula as long as I have samples for her.  Patient verbalizes understanding.

## 2012-08-31 ENCOUNTER — Ambulatory Visit (HOSPITAL_COMMUNITY): Payer: Self-pay | Admitting: Dentistry

## 2012-09-01 ENCOUNTER — Telehealth: Payer: Self-pay | Admitting: Nutrition

## 2012-09-01 NOTE — Telephone Encounter (Signed)
Called patient to follow up on tube feeding tolerance.  Patient reports she is NOT using Ensure Plus since I asked her to discontinue it.  She has a history on intolerance to this product and reported diarrhea when she consumed it by mouth.  She reports using Jevity 1.2 via tube and by mouth (?) with good tolerance for one week.  She states diarrhea was better and had gone away but resumed again on Monday or Tuesday this week.  She states she continues to use Immodium and never stopped using it.  She reports 4 stools today.  Patient said she was waiting to call the doctor to see if the diarrhea went away again.  Patient advised to call MD to inform regarding ongoing diarrhea.    I will continue to monitor tube feeding tolerance.  I will follow up with patient by phone for further recommendations.

## 2012-09-06 ENCOUNTER — Ambulatory Visit: Payer: Medicare Other | Attending: Radiation Oncology | Admitting: Physical Therapy

## 2012-09-07 ENCOUNTER — Emergency Department (HOSPITAL_COMMUNITY)
Admission: EM | Admit: 2012-09-07 | Discharge: 2012-09-08 | Disposition: A | Discharge status: Home / Self Care | Payer: Medicare Other | Attending: Emergency Medicine | Admitting: Emergency Medicine

## 2012-09-07 ENCOUNTER — Encounter (HOSPITAL_COMMUNITY): Payer: Self-pay | Admitting: *Deleted

## 2012-09-07 DIAGNOSIS — Z87891 Personal history of nicotine dependence: Secondary | ICD-10-CM | POA: Insufficient documentation

## 2012-09-07 DIAGNOSIS — E785 Hyperlipidemia, unspecified: Secondary | ICD-10-CM | POA: Insufficient documentation

## 2012-09-07 DIAGNOSIS — H409 Unspecified glaucoma: Secondary | ICD-10-CM | POA: Insufficient documentation

## 2012-09-07 DIAGNOSIS — Z923 Personal history of irradiation: Secondary | ICD-10-CM | POA: Insufficient documentation

## 2012-09-07 DIAGNOSIS — Z79899 Other long term (current) drug therapy: Secondary | ICD-10-CM | POA: Insufficient documentation

## 2012-09-07 DIAGNOSIS — Z8719 Personal history of other diseases of the digestive system: Secondary | ICD-10-CM | POA: Insufficient documentation

## 2012-09-07 DIAGNOSIS — Z7982 Long term (current) use of aspirin: Secondary | ICD-10-CM | POA: Insufficient documentation

## 2012-09-07 DIAGNOSIS — Z8709 Personal history of other diseases of the respiratory system: Secondary | ICD-10-CM | POA: Insufficient documentation

## 2012-09-07 DIAGNOSIS — Z862 Personal history of diseases of the blood and blood-forming organs and certain disorders involving the immune mechanism: Secondary | ICD-10-CM | POA: Insufficient documentation

## 2012-09-07 DIAGNOSIS — Z79811 Long term (current) use of aromatase inhibitors: Secondary | ICD-10-CM | POA: Insufficient documentation

## 2012-09-07 DIAGNOSIS — Z8669 Personal history of other diseases of the nervous system and sense organs: Secondary | ICD-10-CM | POA: Insufficient documentation

## 2012-09-07 DIAGNOSIS — Z90710 Acquired absence of both cervix and uterus: Secondary | ICD-10-CM | POA: Insufficient documentation

## 2012-09-07 DIAGNOSIS — I10 Essential (primary) hypertension: Secondary | ICD-10-CM | POA: Insufficient documentation

## 2012-09-07 DIAGNOSIS — R109 Unspecified abdominal pain: Secondary | ICD-10-CM | POA: Insufficient documentation

## 2012-09-07 DIAGNOSIS — E781 Pure hyperglyceridemia: Secondary | ICD-10-CM | POA: Insufficient documentation

## 2012-09-07 DIAGNOSIS — R197 Diarrhea, unspecified: Secondary | ICD-10-CM | POA: Insufficient documentation

## 2012-09-07 DIAGNOSIS — Z853 Personal history of malignant neoplasm of breast: Secondary | ICD-10-CM | POA: Insufficient documentation

## 2012-09-07 DIAGNOSIS — Z85830 Personal history of malignant neoplasm of bone: Secondary | ICD-10-CM | POA: Insufficient documentation

## 2012-09-07 DIAGNOSIS — Z85828 Personal history of other malignant neoplasm of skin: Secondary | ICD-10-CM | POA: Insufficient documentation

## 2012-09-07 DIAGNOSIS — Z8639 Personal history of other endocrine, nutritional and metabolic disease: Secondary | ICD-10-CM | POA: Insufficient documentation

## 2012-09-07 DIAGNOSIS — Z8739 Personal history of other diseases of the musculoskeletal system and connective tissue: Secondary | ICD-10-CM | POA: Insufficient documentation

## 2012-09-07 DIAGNOSIS — R5381 Other malaise: Secondary | ICD-10-CM | POA: Insufficient documentation

## 2012-09-07 DIAGNOSIS — Z8619 Personal history of other infectious and parasitic diseases: Secondary | ICD-10-CM | POA: Insufficient documentation

## 2012-09-07 DIAGNOSIS — Z931 Gastrostomy status: Secondary | ICD-10-CM | POA: Insufficient documentation

## 2012-09-07 LAB — COMPREHENSIVE METABOLIC PANEL
CO2: 25 mEq/L (ref 19–32)
Calcium: 9.7 mg/dL (ref 8.4–10.5)
Creatinine, Ser: 1.09 mg/dL (ref 0.50–1.10)
GFR calc Af Amer: 54 mL/min — ABNORMAL LOW (ref >90)
GFR calc non Af Amer: 47 mL/min — ABNORMAL LOW (ref >90)
Glucose, Bld: 99 mg/dL (ref 70–99)

## 2012-09-07 LAB — URINALYSIS, ROUTINE W REFLEX MICROSCOPIC
Glucose, UA: NEGATIVE mg/dL
Leukocytes, UA: NEGATIVE
Nitrite: NEGATIVE
Specific Gravity, Urine: 1.027 (ref 1.005–1.030)
pH: 5 (ref 5.0–8.0)

## 2012-09-07 LAB — CBC WITH DIFFERENTIAL/PLATELET
Basophils Relative: 0 % (ref 0–1)
Eosinophils Absolute: 0 10*3/uL (ref 0.0–0.7)
Eosinophils Relative: 0 % (ref 0–5)
Hemoglobin: 8.3 g/dL — ABNORMAL LOW (ref 12.0–15.0)
Lymphs Abs: 1 10*3/uL (ref 0.7–4.0)
MCH: 21.4 pg — ABNORMAL LOW (ref 26.0–34.0)
MCHC: 30.7 g/dL (ref 30.0–36.0)
MCV: 69.8 fL — ABNORMAL LOW (ref 78.0–100.0)
Monocytes Absolute: 0.3 10*3/uL (ref 0.1–1.0)
Platelets: 259 10*3/uL (ref 150–400)
RBC: 3.87 MIL/uL (ref 3.87–5.11)

## 2012-09-07 LAB — LIPASE, BLOOD: Lipase: 85 U/L — ABNORMAL HIGH (ref 11–59)

## 2012-09-07 MED ORDER — DIPHENOXYLATE-ATROPINE 2.5-0.025 MG PO TABS: 1.0000 | ORAL_TABLET | Freq: Four times a day (QID) | ORAL | Status: DC | PRN

## 2012-09-07 MED ORDER — PSYLLIUM 58.6 % PO POWD: 1.0000 | Freq: Three times a day (TID) | ORAL | Status: DC

## 2012-09-07 MED ORDER — SODIUM CHLORIDE 0.9 % IV BOLUS (SEPSIS)
500.0000 mL | Freq: Once | INTRAVENOUS | Status: AC
  Administered 2012-09-07: 500 mL via INTRAVENOUS

## 2012-09-07 MED ORDER — SODIUM CHLORIDE 0.9 % IV SOLN: INTRAVENOUS | Status: DC

## 2012-09-07 MED ORDER — HYDROCODONE-ACETAMINOPHEN 5-325 MG PO TABS: 1.0000 | ORAL_TABLET | Freq: Three times a day (TID) | ORAL | Status: DC | PRN

## 2012-09-07 NOTE — ED Notes (Signed)
Pt reports abd pain and diarrhea for several months; pt states that she has been seen and was put on some medicine; pt states that it has gotten worse over the last few days and now she had vomiting as well

## 2012-09-07 NOTE — ED Notes (Signed)
Unable to obtain urine sample pt unable to void. Will recheck once pt receive fluids

## 2012-09-07 NOTE — ED Notes (Signed)
Pt unable to void at this time. 

## 2012-09-07 NOTE — ED Provider Notes (Signed)
CSN: [id number]     Arrival date & time 09/07/12  1905 History     First MD Initiated Contact with Patient 09/07/12 1954     Chief Complaint  Patient presents with  . Abdominal Pain  . Diarrhea   (Consider location/radiation/quality/duration/timing/severity/associated sxs/prior Treatment) HPI Comments: Patient with history of squamous cell carcinoma of neck, s/p radiation treatment 04-05/14, G-tube placement -- presents with complaint of diarrhea for the past 2 months. Diarrhea is watery, nonbloody. It is associated with abdominal cramping that is waxing and waning. Pain does not radiate. Denies fever, shortness of breath or chest pain, dysuria, rash, lower extremity edema. Patient was prescribed Imodium which helped temporarily but is no longer helping. Patient's physician were concerned diarrhea is caused by liquid diet and G-tube feedings. Onset of symptoms gradual. Course is constant. Nothing makes symptoms better or worse.  Patient is a 79 y.o. female presenting with abdominal pain and diarrhea. The history is provided by the patient and medical records.  Abdominal Pain Associated symptoms: diarrhea and fatigue   Associated symptoms: no chest pain, no cough, no dysuria, no fever, no nausea, no sore throat and no vomiting   Diarrhea Associated symptoms: abdominal pain   Associated symptoms: no fever, no headaches, no myalgias and no vomiting     Past Medical History  Diagnosis Date  . Hypertension   . Hyperlipidemia   . Breast cancer, right breast 08/28/2009  . Use of letrozole (Femara) Since October 2012  . 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 Gout, unspecified   . 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 G tube feedings   . Wound infection after surgery 04/14/12    Opened and Packed - Wet to Dry  . Glaucoma   . Oral aphthae   . Complications affecting other specified body systems,  hypertension   . Chronic angle-closure glaucoma(365.23)   . Pure hyperglyceridemia   . Allergic rhinitis due to pollen   . Malignant neoplasm of breast (female), unspecified site   . Thyroid disease   . Unspecified constipation   . Hypopotassemia   . Enlargement of lymph nodes   . Cough   . Cough   . Acute sinusitis, unspecified   . Other abnormal blood chemistry   . Unspecified vitamin D deficiency   . Other and unspecified hyperlipidemia   . Gout, unspecified   . Unspecified essential hypertension   . Unspecified arthropathy, lower leg   . Other abnormal blood chemistry   . S/P radiation therapy 05/02/2012-06/17/2012      Right Maxillary Tumor Bed and Bilateral Neck / 66 Gy in 33 fractions   Past Surgical History  Procedure Laterality Date  . Simple mastectomy w/ sentinel node biopsy Right 10/09/2009    Breast  . Soft tissue biopsy, right maxillary alveolus  02/12/2012    Invasive Squamous Cell  . Abdominal hysterectomy  1960's    BSO, bleeding  . Cataract surgery      bilat  . Tracheotomy and left selective neck dissection Left 03/18/12     [facility]: Left Selective Neck Dissection/Left Infrastructure Maxillectomy, Palatoplasty, Flap Fasciocutaneous Free Flap Reconstruction of Left Maxillary Defect. Split Thickness Skin Graft of Left Forearm Defect Measuring 10/14/cm  . Dobhoff tube placement  03/18/12  . Breast surgery     Family History  Problem Relation Age of Onset  . Heart disease Mother  CAD  . Stroke Mother   . Cancer Mother     Breast  . Cancer Sister     breast  . Heart attack Father   . Heart attack Sister   . Heart disease Sister   . Heart attack Brother    History  Substance Use Topics  . Smoking status: Former Smoker -- 0.50 packs/day for 4.5 years    Types: Cigarettes    Quit date: 02/22/2009  . Smokeless tobacco: Never Used     Comment: for unknown # yrs  . Alcohol Use: Yes     Comment: Socially- "less than one drink per day"   OB History    Grav Para Term Preterm Abortions TAB SAB Ect Mult Living   4 4             Obstetric Comments   First Pregnancy age 42, No HRT     Review of Systems  Constitutional: Positive for activity change and fatigue. Negative for fever.  HENT: Negative for sore throat and rhinorrhea.   Eyes: Negative for redness.  Respiratory: Negative for cough.   Cardiovascular: Negative for chest pain.  Gastrointestinal: Positive for abdominal pain and diarrhea. Negative for nausea, vomiting and blood in stool.  Genitourinary: Negative for dysuria.  Musculoskeletal: Negative for myalgias.  Skin: Negative for rash.  Neurological: Negative for headaches.    Allergies  Review of patient's allergies indicates no known allergies.  Home Medications   Current Outpatient Rx  Name  Route  Sig  Dispense  Refill  . ALPHAGAN P 0.1 % SOLN   Both Eyes   Place 1 drop into both eyes 2 (two) times daily.          Marland Kitchen aspirin 81 MG tablet   Oral   Take 81 mg by mouth daily.          . colchicine 0.6 MG tablet   Oral   Take 0.6 mg by mouth daily.         . dorzolamide-timolol (COSOPT) 22.3-6.8 MG/ML ophthalmic solution   Both Eyes   Place 1 drop into both eyes 2 (two) times daily.          Marland Kitchen HYDROcodone-acetaminophen (HYCET) 7.5-325 mg/15 ml solution   Oral   Take 15 mLs by mouth at bedtime as needed for pain.   473 mL   0   . letrozole (FEMARA) 2.5 MG tablet   Oral   Take 1 tablet (2.5 mg total) by mouth daily.   30 tablet   11   . loperamide (IMODIUM) 1 MG/5ML solution   Oral   Take 10 mLs (2 mg total) by mouth 4 (four) times daily as needed for diarrhea or loose stools.   120 mL   5   . lovastatin (ALTOPREV) 40 MG 24 hr tablet   Oral   Take 40 mg by mouth at bedtime.         . mirtazapine (REMERON SOLTAB) 15 MG disintegrating tablet   Oral   Take 1 tablet (15 mg total) by mouth at bedtime.   30 tablet   3   . nebivolol (BYSTOLIC) 5 MG tablet   Oral   Take 1 tablet (5 mg  total) by mouth daily.   30 tablet   3   . Petrolatum OINT      Apply to thigh daily         . sodium fluoride (FLUORISHIELD) 1.1 % GEL dental gel  Instill one drop of fluoride per tooth space of fluoride tray. Place over teeth for 5 minutes. Remove. Spit out excess. Repeat nightly.   120 mL   prn   . White Petrolatum (PETROLATUM WHITE) OINT      Apply to thigh daily          BP 120/68  Pulse 94  Temp(Src) 99.6 F (37.6 C) (Oral)  Resp 20  SpO2 100% Physical Exam  Nursing note and vitals reviewed. Constitutional: She appears well-developed and well-nourished.  HENT:  Head: Normocephalic and atraumatic.  Eyes: Conjunctivae are normal. Right eye exhibits no discharge. Left eye exhibits no discharge.  Neck: Normal range of motion. Neck supple.  Cardiovascular: Normal rate, regular rhythm and normal heart sounds.   Pulmonary/Chest: Effort normal and breath sounds normal.  Abdominal: Soft. She exhibits no distension. There is tenderness (mild). There is no rebound and no guarding.  G-tube in place left abdomen  Neurological: She is alert.  Skin: Skin is warm and dry.  Psychiatric: She has a normal mood and affect.    ED Course   Procedures (including critical care time)  Labs Reviewed  CBC WITH DIFFERENTIAL - Abnormal; Notable for the following:    Hemoglobin 8.3 (*)    HCT 27.0 (*)    MCV 69.8 (*)    MCH 21.4 (*)    RDW 16.6 (*)    All other components within normal limits  COMPREHENSIVE METABOLIC PANEL - Abnormal; Notable for the following:    BUN 25 (*)    GFR calc non Af Amer 47 (*)    GFR calc Af Amer 54 (*)    All other components within normal limits  LIPASE, BLOOD - Abnormal; Notable for the following:    Lipase 85 (*)    All other components within normal limits  URINALYSIS, ROUTINE W REFLEX MICROSCOPIC - Abnormal; Notable for the following:    Bilirubin Urine SMALL (*)    All other components within normal limits  STOOL CULTURE  GI  PATHOGEN PANEL BY PCR, STOOL  CG4 I-STAT (LACTIC ACID)   No results found. 1. Diarrhea      8:30 PM Patient seen and examined. Work-up initiated. D/w Dr. Fayrene Fearing.    Vital signs reviewed and are as follows: Filed Vitals:   09/07/12 1923  BP: 120/68  Pulse: 94  Temp: 99.6 F (37.6 C)  Resp: 20   Dr. Fayrene Fearing has seen.   UA normal. Pt informed. She has received fluids.   D/c to home with hydrocodone, fiber, imodium. Urged to f/u with PCP for further eval.   The patient was urged to return to the Emergency Department immediately with worsening of current symptoms, worsening abdominal pain, persistent vomiting, blood noted in stools, fever, or any other concerns. The patient verbalized understanding.     MDM  Patient with chronic diarrhea, tube feeds. Diarrhea likely 2/2 to tube feedings. Abd soft. No fever. Labs reassuring. No electrolyte abnormality. Anemia, but stable. UA clean. Lactate nml. Mild elevation in lipase but sx not c/w pancreatitis. She appears well, non-toxic. No indication for admission. Pt d/c with above medications.   Renne Crigler, PA-C 09/08/12 [phone]

## 2012-09-08 NOTE — ED Provider Notes (Signed)
Medical screening examination/treatment/procedure(s) were conducted as a shared visit with non-physician practitioner(s) and myself.  I personally evaluated the patient during the encounter  Pt examined.  Diarrhea for several months.  Likely related to liquid/tube feed diet.  Labs reassuring.  Pt mentating well.  DC plan discussed with pt and husband.   Claudean Kinds, MD 09/08/12 [phone]

## 2012-09-15 ENCOUNTER — Telehealth: Payer: Self-pay | Admitting: *Deleted

## 2012-09-15 NOTE — Telephone Encounter (Signed)
CALLED PATIENT TO ASK ABOUT COMING IN FOR STAT LABS, PATIENT SAID THAT SHE WOULD BE WILLING TO COME ON 09-23-12 AT 8:00 AM

## 2012-09-16 ENCOUNTER — Ambulatory Visit: Payer: Medicare Other | Admitting: Nutrition

## 2012-09-16 MED ORDER — VITAL 1.5 CAL PO LIQD: ORAL | Status: DC

## 2012-09-16 NOTE — Progress Notes (Signed)
I spoke to patient on the telephone who reports she had a recent visit to the emergency department.  Patient reports diarrhea continues.  She has had abdominal pain.  Patient has not been weighed since August 1.  However, weight at that time had continued to decline and was documented as 116 pounds.  Patient has been told by physician to resume Ensure Plus.  Patient does not tolerate this product, which causes her to have worsening diarrhea.  I have asked patient to DC Ensure Plus.  I am writing new tube feeding orders for patient to begin one can of vital 1.5 with 90 mL of free water before and after via feeding tube 5 times a day.  I've educated patient on appropriate tube feeding administration.  Patient repeated back tube feeding instructions.  [facility] will be notified of new tube feeding orders. They will enforce tube feeding administration.  I will contact patient next week to evaluate TF tolerance.

## 2012-09-19 DIAGNOSIS — C760 Malignant neoplasm of head, face and neck: Secondary | ICD-10-CM

## 2012-09-19 DIAGNOSIS — I10 Essential (primary) hypertension: Secondary | ICD-10-CM

## 2012-09-19 DIAGNOSIS — M109 Gout, unspecified: Secondary | ICD-10-CM

## 2012-09-19 DIAGNOSIS — H409 Unspecified glaucoma: Secondary | ICD-10-CM

## 2012-09-23 ENCOUNTER — Encounter: Payer: Medicare Other | Admitting: Nutrition

## 2012-09-23 ENCOUNTER — Ambulatory Visit (HOSPITAL_COMMUNITY)
Admission: RE | Admit: 2012-09-23 | Discharge: 2012-09-23 | Disposition: A | Discharge status: Home / Self Care | Payer: Medicare Other | Source: Ambulatory Visit | Attending: Radiation Oncology | Admitting: Radiation Oncology

## 2012-09-23 ENCOUNTER — Encounter (HOSPITAL_COMMUNITY): Payer: Self-pay

## 2012-09-23 ENCOUNTER — Encounter (HOSPITAL_COMMUNITY)
Admission: RE | Admit: 2012-09-23 | Discharge: 2012-09-23 | Disposition: A | Discharge status: Home / Self Care | Payer: Medicare Other | Source: Ambulatory Visit | Attending: Radiation Oncology | Admitting: Radiation Oncology

## 2012-09-23 ENCOUNTER — Telehealth: Payer: Self-pay | Admitting: Nutrition

## 2012-09-23 ENCOUNTER — Other Ambulatory Visit: Payer: Self-pay | Admitting: Radiation Oncology

## 2012-09-23 ENCOUNTER — Ambulatory Visit
Admission: RE | Admit: 2012-09-23 | Discharge: 2012-09-23 | Disposition: A | Discharge status: Home / Self Care | Payer: Medicare Other | Source: Ambulatory Visit | Attending: Radiation Oncology | Admitting: Radiation Oncology

## 2012-09-23 DIAGNOSIS — R918 Other nonspecific abnormal finding of lung field: Secondary | ICD-10-CM | POA: Insufficient documentation

## 2012-09-23 DIAGNOSIS — I7090 Unspecified atherosclerosis: Secondary | ICD-10-CM | POA: Insufficient documentation

## 2012-09-23 DIAGNOSIS — C030 Malignant neoplasm of upper gum: Secondary | ICD-10-CM | POA: Insufficient documentation

## 2012-09-23 DIAGNOSIS — C50919 Malignant neoplasm of unspecified site of unspecified female breast: Secondary | ICD-10-CM | POA: Insufficient documentation

## 2012-09-23 DIAGNOSIS — M47812 Spondylosis without myelopathy or radiculopathy, cervical region: Secondary | ICD-10-CM | POA: Insufficient documentation

## 2012-09-23 DIAGNOSIS — R1904 Left lower quadrant abdominal swelling, mass and lump: Secondary | ICD-10-CM | POA: Insufficient documentation

## 2012-09-23 LAB — GLUCOSE, CAPILLARY: Glucose-Capillary: 71 mg/dL (ref 70–99)

## 2012-09-23 MED ORDER — FLUDEOXYGLUCOSE F - 18 (FDG) INJECTION
17.5000 | Freq: Once | INTRAVENOUS | Status: AC | PRN
  Administered 2012-09-23: 17.5 via INTRAVENOUS

## 2012-09-23 MED ORDER — IOHEXOL 300 MG/ML  SOLN
100.0000 mL | Freq: Once | INTRAMUSCULAR | Status: AC | PRN
  Administered 2012-09-23: 100 mL via INTRAVENOUS

## 2012-09-23 NOTE — Telephone Encounter (Signed)
I called patient today and spoke with her on the telephone.  Patient reports that she utilized vital 1.5 beginning of the week with good tolerance.  She reports being able to tolerate 2-3 cans on Monday and Tuesday this week.  She does state she did not feel well yesterday and only was able to sip on one can by mouth.  There is no new weight documented.  I am unable to determine if her diarrhea is worse than it was.  Patient has appointment with Dr. Basilio Cairo on Tuesday, September 9.  I will followup with her after her doctor's appointment.  She is to utilize vital 1.5 as tolerated over the weekend.

## 2012-09-27 ENCOUNTER — Encounter: Payer: Self-pay | Admitting: Nutrition

## 2012-09-27 ENCOUNTER — Encounter: Payer: Self-pay | Admitting: Radiation Oncology

## 2012-09-27 ENCOUNTER — Ambulatory Visit: Payer: Medicare Other | Admitting: Radiation Oncology

## 2012-09-27 ENCOUNTER — Ambulatory Visit
Admission: RE | Admit: 2012-09-27 | Discharge: 2012-09-27 | Disposition: A | Discharge status: Home / Self Care | Payer: Medicare Other | Source: Ambulatory Visit | Attending: Radiation Oncology | Admitting: Radiation Oncology

## 2012-09-27 ENCOUNTER — Ambulatory Visit: Payer: Medicare Other | Admitting: Nutrition

## 2012-09-27 VITALS — BP 139/78 | HR 76 | Temp 97.9°F | Ht 68.0 in | Wt 110.8 lb

## 2012-09-27 DIAGNOSIS — C030 Malignant neoplasm of upper gum: Secondary | ICD-10-CM

## 2012-09-27 MED ORDER — HYDROCODONE-ACETAMINOPHEN 5-325 MG PO TABS: 1.0000 | ORAL_TABLET | Freq: Three times a day (TID) | ORAL | Status: DC | PRN

## 2012-09-27 MED ORDER — DIPHENOXYLATE-ATROPINE 2.5-0.025 MG PO TABS: 1.0000 | ORAL_TABLET | Freq: Four times a day (QID) | ORAL | Status: DC | PRN

## 2012-09-27 MED ORDER — NYSTATIN 100000 UNIT/ML MT SUSP: 500000.0000 [IU] | Freq: Four times a day (QID) | OROMUCOSAL | Status: DC

## 2012-09-27 NOTE — Progress Notes (Signed)
I was able to catch patient in the lobby on her way out today.  Patient reports her doctor has prescribed new medication for her diarrhea.  She states her physician is referring her to a specialist.  She tells me her diarrhea is the same as it's been.  Patient's weight has decreased significantly to 110.8 pounds September 9 from 116 pounds August 1.  She is utilizing vital 1.5.  Approximately 3 cans a day.  She reports she generally drinks it versus using it in her tube.  She states she is a bit unsteady and ends up spilling it when she uses it in her tube.  I encouraged patient to work to increase vital 1.5 to 4 times a day.  She will continue to try soft foods as tolerated.  Patient understands she can contact me with further questions regarding nutrition.  She has my contact information.

## 2012-09-27 NOTE — Progress Notes (Signed)
Patient did not show for nutrition follow up.

## 2012-09-27 NOTE — Progress Notes (Addendum)
Kristine Morales here for a FU s/p radiation for maxillary alveolar ridge cancer.  She has lost 5.2 lbs since 08/19/12.  She states that whenever she purees her food, and tries to swallow that  it swells up in her mouth, and when she actually swallows  food gets stuck in her throat and this causes coughing.    She is drinking 3 ensures, on average, daily.  She states she cannot smell nor taste her food whic decreases her desire to eat.  Encouraged to drink 4 cans of vital Cal.  Her mouth is clear and the skin in the prior tx field is soft with hyperpigmentation.  Note decrease in hardness in the chin region, and she states she can extend her neck more.

## 2012-09-28 ENCOUNTER — Encounter: Payer: Self-pay | Admitting: Radiation Oncology

## 2012-09-28 ENCOUNTER — Ambulatory Visit (HOSPITAL_COMMUNITY): Payer: Self-pay | Admitting: Dentistry

## 2012-09-28 ENCOUNTER — Encounter (HOSPITAL_COMMUNITY): Payer: Self-pay | Admitting: Dentistry

## 2012-09-28 ENCOUNTER — Telehealth: Payer: Self-pay | Admitting: *Deleted

## 2012-09-28 VITALS — BP 117/70 | HR 73 | Temp 98.0°F | Wt 117.0 lb

## 2012-09-28 DIAGNOSIS — Z0189 Encounter for other specified special examinations: Secondary | ICD-10-CM

## 2012-09-28 DIAGNOSIS — C030 Malignant neoplasm of upper gum: Secondary | ICD-10-CM

## 2012-09-28 DIAGNOSIS — Z09 Encounter for follow-up examination after completed treatment for conditions other than malignant neoplasm: Secondary | ICD-10-CM

## 2012-09-28 DIAGNOSIS — Z85819 Personal history of malignant neoplasm of unspecified site of lip, oral cavity, and pharynx: Secondary | ICD-10-CM

## 2012-09-28 DIAGNOSIS — K08409 Partial loss of teeth, unspecified cause, unspecified class: Secondary | ICD-10-CM

## 2012-09-28 NOTE — Progress Notes (Signed)
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   Narrative:  The patient returns today for routine follow-up.  She has lost 5.2 lbs since 08/19/12. She states that whenever she purees her food, and tries to swallow that it swells up in her mouth, and when she actually swallows food gets stuck in her throat and this causes coughing. She is drinking 3 ensures, on average, daily. She states she cannot smell nor taste her food which decreases her desire to eat. Nursing encouraged to drink 4 cans of vital Cal. There is decrease in hardness in the chin region, and she states she can extend her neck more.  PET concerning for pulmonary metastases.                           ALLERGIES:  has No Known Allergies.  Meds: Current Outpatient Prescriptions  Medication Sig Dispense Refill  . ALPHAGAN P 0.1 % SOLN Place 1 drop into both eyes 2 (two) times daily.       Marland Kitchen aspirin 81 MG tablet Take 81 mg by mouth daily.       . colchicine 0.6 MG tablet Take 0.6 mg by mouth daily.      . diphenoxylate-atropine (LOMOTIL) 2.5-0.025 MG per tablet Take 1 tablet by mouth 4 (four) times daily as needed for diarrhea or loose stools.  60 tablet  3  . dorzolamide-timolol (COSOPT) 22.3-6.8 MG/ML ophthalmic solution Place 1 drop into both eyes 2 (two) times daily.       Marland Kitchen HYDROcodone-acetaminophen (NORCO/VICODIN) 5-325 MG per tablet Take 1 tablet by mouth every 8 (eight) hours as needed for pain.  45 tablet  3  . letrozole (FEMARA) 2.5 MG tablet  Take 1 tablet (2.5 mg total) by mouth daily.  30 tablet  11  . loperamide (IMODIUM) 1 MG/5ML solution Take 10 mLs (2 mg total) by mouth 4 (four) times daily as needed for diarrhea or loose stools.  120 mL  5  . lovastatin (ALTOPREV) 40 MG 24 hr tablet Take 40 mg by mouth at bedtime.      . mirtazapine (REMERON SOLTAB) 15 MG disintegrating tablet Take 1 tablet (15 mg total) by mouth at bedtime.  30 tablet  3  . nebivolol (BYSTOLIC) 5 MG tablet Take 1 tablet (5 mg total) by mouth daily.  30 tablet  3  . Nutritional Supplements (FEEDING SUPPLEMENT, VITAL 1.5 CAL,) LIQD Begin Vital 1.5 via feeding tube with 1 can 5 times daily with 90 ml free water before & after bolus feeding.  Send formula ASAP & RN for tube feeding teaching on appropriate administration.  D/C Ensure Plus and Jevity Plus by mouth and tube.  1185 mL    . psyllium (METAMUCIL SMOOTH TEXTURE) 58.6 % powder Take 1 packet by mouth 3 (three) times daily.  283 g  12  . sodium fluoride (FLUORISHIELD) 1.1 % GEL dental gel Instill one drop of fluoride per tooth space of fluoride tray. Place over teeth for  5 minutes. Remove. Spit out excess. Repeat nightly.  120 mL  prn  . nystatin (MYCOSTATIN) 100000 UNIT/ML suspension Take 5 mLs (500,000 Units total) by mouth 4 (four) times daily. Swish in mouth and swallow. Use up bottle.  240 mL  0   No current facility-administered medications for this encounter.    Physical Findings: The patient is in no acute distress. Patient is alert and oriented.  height is 5\' 8"  (1.727 m) and weight is 110 lb 12.8 oz (50.259 kg). Her temperature is 97.9 F (36.6 C). Her blood pressure is 139/78 and her pulse is 76. .  No significant changes. Small amount of thrush over tongue, soft palate.  No signs of recurrence in oral cavity/oropharynx.  Neck - no palpable adenopathy in cervical or SCV regions.  Lungs CTAB.  Heart RRR.   Lab Findings: Lab Results  Component Value Date   WBC 4.7 09/07/2012   HGB 8.3* 09/07/2012     HCT 27.0* 09/07/2012   MCV 69.8* 09/07/2012   PLT 259 09/07/2012    Radiographic Findings: Ct Soft Tissue Neck W Contrast  09/23/2012   *RADIOLOGY REPORT*  Clinical Data: Squamous cell carcinoma maxillary ridge.  Breast cancer.  CT NECK WITH CONTRAST  Technique:  Multidetector CT imaging of the neck was performed with intravenous contrast.  Contrast: OMNIPAQUE IOHEXOL 300 MG/ML  SOLN  Comparison: 02/23/2012 CTand PET CT.  09/23/2012 PET CT.  Findings: Surgical resection of posterior right maxilla mass with resection of the mid to posterior right alveolar ridge. Soft tissue in this region may represent postoperative changes/ inflammation although tumor not entirely excluded.  On the PET CT performed same date, there is low-level radiotracer uptake in this region. Attention to this on follow-up (series 2 image 119).  Resection of previously noted necrotic right submandibular adenopathy. No necrotic adenopathy presently detected.  Post radiation therapy type changes with inflammation of the right parotid gland and haziness of fat planes greater on the right.  Opacification of the right mastoid air cells and middle ear cavity suggestive of result of right sided eustachian tube dysfunction probably caused by surgery/radiation therapy.  Left upper lobe 9 mm and 5 mm nodules suggestive of metastatic disease. These areas a are noted to be hypermetabolic and consistent with metastatic disease.  Sub centimeter low density lesions in the thyroid gland bilaterally.  Atherosclerotic type changes carotid bifurcation with moderate narrowing greater on the right.  Cervical spondylotic changes with spinal stenosis and cord flattening C3-4 through C5-6.  IMPRESSION: Surgical resection of posterior right maxilla mass with resection of the mid to posterior right alveolar ridge. Soft tissue in this region may represent postoperative changes/ inflammation although tumor not entirely excluded.  On the PET CT performed same  date, there is low-level radiotracer uptake in this region.  Attention to this on follow-up (series 2 image 119).  Resection of previously noted necrotic right submandibular adenopathy. No necrotic adenopathy presently detected.  Opacification of the right mastoid air cells and middle ear cavity suggestive of result of right sided eustachian tube dysfunction probably caused by surgery/radiation therapy.  Left upper lobe 9 mm and 5 mm nodules s consistent with metastatic disease. These areas a are noted to be hypermetabolic and consistent with metastatic disease.  Atherosclerotic type changes carotid bifurcation with moderate narrowing greater on the right.   Original Report Authenticated By: Lacy Duverney, M.D.   Nm Pet Image Restag (ps) Skull Base To Thigh  09/23/2012   CLINICAL DATA:  Subsequent  treatment strategy for squamous cell carcinoma of the maxillary alveolar ridge.  EXAM: NUCLEAR MEDICINE PET SKULL BASE TO THIGH  TECHNIQUE: 16.0 mCi F-18 FDG was injected intravenously. CT data was obtained and used for attenuation correction and anatomic localization only. (This was not acquired as a diagnostic CT examination.) Additional exam technical data entered on technologist worksheet.  COMPARISON:  02/22/2012  FINDINGS: NECK  There has been interval surgical resection of the previously seen hypermetabolic mass centered in the right maxillary alveolar ridge. No residual hypermetabolic mass seen at this site. There has also been interval resolution of hypermetabolic right submandibular lymphadenopathy since prior exam. No residual hypermetabolic lymphadenopathy identified within the neck.  CHEST  Several small less than 1 cm pulmonary nodules are now seen in the left upper lobe which show hypermetabolic activity, consistent with pulmonary metastases. A tiny less than 5 mm central right upper lobe pulmonary nodule is seen on image 72 which shows faint hypermetabolic activity, also suspicious for a pulmonary  metastasis. No hypermetabolic hilar or mediastinal lymph nodes identified.  ABDOMEN/PELVIS  No abnormal hypermetabolic activity within the liver, pancreas, adrenal glands, or spleen. No hypermetabolic lymph nodes in the abdomen or pelvis. A large approximate 10 cm simple appearing cystic lesion is again seen in the left pelvis which is stable and shows no hypermetabolic activity.  SKELETON  no focal hypermetabolic activity to suggest skeletal metastasis.  IMPRESSION: No residual malignancy identified in the maxilla or neck.  New sub cm hypermetabolic bilateral upper lobe pulmonary nodules, consistent with pulmonary metastases.  No evidence of abdominal or pelvic metastatic disease. Stable benign appearing cystic lesion in the left pelvis.  FASTING BLOOD GLUCLOSE: 71.0   Electronically Signed   By: Myles Rosenthal   On: 09/23/2012 14:21    Impression/Plan:    1) Head and Neck Cancer Status: Concerning for asymptomatic distant lung metastases on PET scan. Will request for medical oncology (Dr Darnelle Catalan) to move up her appointment so that systemic options can be discussed. I will not order a biopsy as I am not sure Ms. Heffern would choose to pursue  Chemotherapy if metastatic disease is proven. She declined concurrent chemo for the initial tx of her H+N cancer (essentially by repeatedly no-showing for med.onc appointments.)  Of note, she does have a history of breast cancer as well. We discussed the PET results in detail.  She understands this could be metastatic cancer.  She declined looking at the images with me.  2) Nutritional Status: - weight: declining - PEG tube: still using, also drinking shakes.  Limited compliance with attending nutrition appointments  3) Risk Factors: The patient has been educated about risk factors including alcohol and tobacco abuse; they understand that avoidance of alcohol and tobacco is important to prevent recurrences as well as other cancers  4) Swallowing: reports dysphagia  even with soft /pureed foods.  I offered referring her back to [facility] to help her with her dysphagia - perhaps if she can increase consumption of solids, this will help her diarrhea.  She is agreeable to this.  5) Dental: Encouraged to continue regular followup with dentistry, and dental hygiene including fluoride rinses.   6) Energy: fair.  Check TSH at next appointment.  7) Social: No active social issues to address at this time  8) Other:  Diarrhea - Continues.  She says Lomotil helped but she ran out. Refill given today. I offered a GI consult but she declined this.  Alternatively, I offered referring her back  to [facility] to help her with her dysphagia - perhaps if she can increase consumption of solids, this will help her diarrhea.  She is agreeable to this.  Pt has been Referred back to [facility] Hezz[facility] Bump, surgeon) as of August.  Advised compliance with Remeron, this can take weeks to work   Social: Chronic issues with compliance.    THRUSH - nystatin rx'd today  Pain - Hydrocodone refilled.  9) Follow-up in Feb 2015 with me.  As above, hopefully a reconsultation with med/onc can occur in near future. The patient was encouraged to call with any issues or questions before then.  I spent 25 minutes minutes face to face with the patient and more than 50% of that time was spent in counseling and/or coordination of care. _____________________________________   Lonie Peak, MD

## 2012-09-28 NOTE — Progress Notes (Signed)
09/28/2012   Patient:            Kristine Morales Date of Birth:  11/04/1931 MRN:                [id number]  BP 117/70  Pulse 73  Temp(Src) 98 F (36.7 C) (Oral)  Wt 117 lb (53.071 kg)  BMI 17.79 kg/m2  [name] presents for periodic oral examination after radiation therapy. Patient received radiation therapy from 04/22/2012 through 06/17/2012 with Dr. Basilio Cairo.  REVIEW OF CHIEF COMPLAINTS:  DRY MOUTH: Yes HARD TO SWALLOW: Yes, at times.  HURT TO SWALLOW: No TASTE CHANGES: No SORES IN MOUTH: No TRISMUS:  She has had some discomfort in the lower neck area and was evaluated by physical therapy. WEIGHT: 117 pounds  HOME OH REGIMEN:  BRUSHING: Twice a day FLOSSING: Daily RINSING: Uses baking soda and salt water rinses as needed FLUORIDE: Using fluoride at bedtime. TRISMUS EXERCISES:  Maximum interincisal opening: 25 mm    DENTAL EXAM:  Oral Hygiene:(PLAQUE): Plaque is noted. Oral hygiene improvement was highly suggested. Patient is to followup with a dentist of her choice for an exam and cleaning soon as possible. LOCATION OF MUCOSITIS: Patient has oral candidiasis currently being treated with nystatin rinsed with Dr. Basilio Cairo. DESCRIPTION OF SALIVA: Decreased. Moderate xerostomia ANY EXPOSED BONE: None noted OTHER WATCHED AREAS: Previous surgical site of the upper right maxilla. DX: Xerostomia, Dysphagia and Accretions, Missing teeth, oral candidiasis.  RECOMMENDATIONS: 1. Brush after meals and at bedtime.  Use fluoride at bedtime. 2. Use trismus exercises as directed. 3. Use Biotene Rinse or salt water/baking soda rinses as needed. 4. Multiple sips of water as needed. 5. Follow up with primary dentist for dental examinations and cleanings. Consider [facility] for cleanings. 6. Consider referral to Prosthodontist for evaluation of replacement of missing teeth. Patient will contact me if she desires referral to Dr. Hulan Saas. Call if problems arise  Charlynne Pander,  DDS 09/28/2012

## 2012-09-28 NOTE — Patient Instructions (Addendum)
RECOMMENDATIONS: 1. Brush after meals and at bedtime.  Use fluoride at bedtime. 2. Use trismus exercises as directed. 3. Use Biotene Rinse or salt water/baking soda rinses as needed. 4. Multiple sips of water as needed. 5. Follow up with primary dentist for dental examinations and cleanings. Consider [facility] for cleanings. 6. Consider referral to Prosthodontist for evaluation of replacement of missing teeth. Patient will contact me if she desires referral to Dr. Hulan Saas. Call if problems arise   RADIATION THERAPY AND DECISIONS REGARDING YOUR TEETH  Xerostomia (dry mouth) Your salivary glands may be in the filed of radiation.  Radiation may include all or part of your saliva glands.  This will cause your saliva to dry up and you will have a dry mouth.  The dry mouth will be for the rest of your life unless your radiation oncologist tells you otherwise.  Your saliva has many functions:  Saliva wets your tongue for speaking.  It coats your teeth and the inside of your mouth for easier movement.  It helps with chewing and swallowing food.  It helps clean away harmful acid and toxic products made by the germs in your mouth, therefore it helps prevent cavities.  It kills some germs in your mouth and helps to prevent gum disease.  It helps to carry flavor to your taste buds.  Once you have lost your saliva you will be at higher risk for tooth decay and gum disease.  What can be done to help improve your mouth when there's not enough saliva:  1.  Your dentist may give a prescription for Salagen.  It will not bring back all of your saliva but may bring back some of it.  Also your saliva may be thick and ropy or white and foamy. It will not feel like it use to feel.  2.  You will need to swish with water every time your mouth feels dry.  YOU CANNOT suck on any cough drops, mints, lemon drops, candy, vitamin C or any other products.  You cannot use anything other than water to make your  mouth feel less dry.  If you want to drink anything else you have to drink it all at once and brush afterwards.  Be sure to discuss the details of your diet habits with your dentist or hygienist.  Radiation caries: This is decay that happens very quickly once your mouth is very dry due to radiation therapy.  Normally cavities take six months to two years to become a problem.  When you have dry mouth cavities may take as little as eight weeks to cause you a problem.  This is why dental check ups every two months are necessary as long as you have a dry mouth. Radiation caries typically, but not always, start at your gum line where it is hard to see the cavity.  It is therefore also hard to fill these cavities adequately.  This high rate of cavities happens because your mouth no longer has saliva and therefore the acid made by the germs starts the decay process.  Whenever you eat anything the germs in your mouth change the food into acid.  The acid then burns a small hole in your tooth.  This small hole is the beginning of a cavity.  If this is not treated then it will grow bigger and become a cavity.  The way to avoid this hole getting bigger is to use fluoride every evening as prescribed by your dentist.  You  have to make sure that your teeth are very clean before you use the fluoride.  This fluoride in turn will strengthen your teeth and prepare them for another day of fighting acid.  If you develop radiation caries many times the damage is so large that you will have to have all your teeth removed.  This could be a big problem if some of these teeth are in the field of radiation.  Further details of why this could be a big problem will follow.  (See Osteoradionecrosis).  Loss of taste (dysgeusia) This happens to varying degrees once you've had radiation therapy to your jaw region.  Many times taste is not completely lost but becomes limited.  The loss of taste is mostly due to radiation affecting your taste  buds.  However if you have no saliva in your mouth to carry the flavor to your taste buds it would be difficult for your taste buds to taste anything.  That is why using water or a prescription for Salagen prior to meals and during meals may help with some of the taste.  Keep in mind that taste generally returns very slowly over the course of several months or several years after radiation therapy.  Don't give up hope.  Trismus According to your Radiation Oncologist your TMJ or jaw joints are going to be partially or fully in the field of radiation.  This means that over time the muscles that help you open and close your mouth may get stiff.  This will potentially result in your not being able to open your mouth wide enough or as wide as you can open it now.  Le me give you an example of how slowly this happens and how unaware people are of it.  A gentlemen that had radiation therapy two years ago came back to me complaining that bananas are just too large for him to be able to fit them in between his teeth.  He was not able to open wide enough to bite into a banana.  This happens slowly and over a period of time.  What do we do to try and prevent this?  Your dentist will probably give you a stack of sticks called a trismus exercise device .  This stack will help your remind your muscles and your jaw joint to open up to the same distance every day.  Use these sticks every morning when you wake up according to the instructions given by the dentist.   You must use these sticks for at least one to two years after radiation therapy.  The reason for that is because it happens so slowly and keeps going on for about two years after radiation therapy.  Your hospital dentist will help you monitor your mouth opening and make sure that it's not getting smaller.  Osteoradionecrosis (ORN) This is a condition where your jaw bone after having had radiation therapy becomes very dry.  It has very little blood supply to keep  it alive.  If you develop a cavity that turns into an abscess or an infection then the jaw bone does not have enough blood supply to help fight the infection.  At this point it is very likely that the infection could cause the death of your jaw bone.  When you have dead bone it has to be removed.  Therefore you might end up having to have surgery to remove part of your jaw bone, the part of the jaw bone that has been  affected.   Healing is also a problem if you are to have surgery in the areas where the bone has had radiation therapy.  The same reasons apply.  If you have surgery you need more blood supply which is not available.  When blood supply and oxygen are not available again, there is a chance for the bone to die.  Occasionally ORN happens on its own with no obvious reason.  This is quite rare.  We believe that patients who continue to smoke and/or drink alcohol have a higher chance of having this bone problem.  Therefore once your jaw bone has had radiation therapy if there are any teeth in that area, you should never have them pulled.  You should also never have any surgery on your teeth or gums in that area unless the oral surgeon or Periodontist is aware of your history of radiation. There is some expensive management techniques that might be used to limit your risks.  The risks for ORN either from infection or spontaneous ( or on it's own) are life long.    TRISMUS  Trismus is a condition where the jaw does not allow the mouth to open as wide as it usually does.  This can happen almost suddenly, or in other cases the process is so slow, it is hard to notice it-until it is too far along.  When the jaw joints and/or muscles have been exposed to radiation treatments, the onset of Trismus is very slow.  This is because the muscles are losing their stretching ability over a long period of time, as long as 2 YEARS after the end of radiation.  It is therefore important to exercise these muscles and  joints.  TRISMUS EXERCISES   Stack of tongue depressors measuring the same or a little less than the last documented MIO (Maximum Interincisal Opening).  Secure them with a rubber band on both ends.  Place the stack in the patient's mouth, supporting the other end.  Allow 30 seconds for muscle stretching.  Rest for a few seconds.  Repeat 3-5 times  For all radiation patients, this exercise is recommended in the mornings and evenings unless otherwise instructed.  The exercise should be done for a period of 2 YEARS after the end of radiation.  MIO should be checked routinely on recall dental visits by the general dentist or the hospital dentist.  The patient is advised to report any changes, soreness, or difficulties encountered when doing the exercises.  FLUORIDE TRAYS PATIENT INSTRUCTIONS    Obtain prescription from the pharmacy.  Don't be surprised if it needs to be ordered.   Be sure to let the pharmacy know when you are close to needing a new refill for them to have it ready for you without interruption of Fluoride use.   The best time to use your Fluoride is before bed time.   You must brush your teeth very well and floss before using the Fluoride in order to get the best use out of the Fluoride treatments.   Place 1 drop of Fluoride gel per tooth in the tray.   Place the tray on your lower teeth and/or your upper teeth.  Make sure the trays are seated all the way.  Remember, they only fit one way on your teeth.   Insert for 5 full minutes.   At the end of the 5 minutes, take the trays out.  SPIT OUT excess. .    Do NOT rinse your mouth!  Do NOT eat or drink after treatments for at least 30 minutes.  This is why the best time for your treatments is before bedtime.    Clean the inside of your Fluoride trays using COLD WATER and a toothbrush.    In order to keep your Trays from discoloring and free from odors, soak them overnight in denture  cleaners such as Efferdent.  Do not use bleach or non denture products.    Store the trays in a safe dry place AWAY from any heat until your next treatment.    Bring the trays with you for your next dental check-up.  The dentist will confirm their fit.    If anything happens to your Fluoride trays, or they don't fit as well after any dental work, please let us know as soon as possible.

## 2012-09-28 NOTE — Telephone Encounter (Signed)
CALLED PATIENT TO INFORM OF FU AND LAB APPT. WITH DR. Basilio Cairo AND HER APPT. WITH DR. CARL SCHINKE ON 10-10-12- ARRIVAL TIME - 1:00 PM, LVM FOR A RETURN CALL

## 2012-09-29 ENCOUNTER — Other Ambulatory Visit: Payer: Self-pay | Admitting: Oncology

## 2012-10-10 ENCOUNTER — Ambulatory Visit: Payer: Medicare Other

## 2012-10-10 ENCOUNTER — Ambulatory Visit: Payer: Medicare Other | Attending: Radiation Oncology

## 2012-10-10 DIAGNOSIS — R1310 Dysphagia, unspecified: Secondary | ICD-10-CM | POA: Insufficient documentation

## 2012-10-10 DIAGNOSIS — IMO0001 Reserved for inherently not codable concepts without codable children: Secondary | ICD-10-CM | POA: Insufficient documentation

## 2012-10-12 ENCOUNTER — Telehealth: Payer: Self-pay | Admitting: *Deleted

## 2012-10-12 ENCOUNTER — Other Ambulatory Visit: Payer: Self-pay | Admitting: Radiation Oncology

## 2012-10-12 ENCOUNTER — Other Ambulatory Visit (HOSPITAL_COMMUNITY): Payer: Self-pay | Admitting: Radiation Oncology

## 2012-10-12 ENCOUNTER — Telehealth: Payer: Self-pay | Admitting: Hematology and Oncology

## 2012-10-12 DIAGNOSIS — R1310 Dysphagia, unspecified: Secondary | ICD-10-CM

## 2012-10-12 DIAGNOSIS — C760 Malignant neoplasm of head, face and neck: Secondary | ICD-10-CM

## 2012-10-12 NOTE — Telephone Encounter (Signed)
Called [facility] to arrange fu visit with Dr. Darnelle Catalan, lvm for a return call

## 2012-10-12 NOTE — Telephone Encounter (Signed)
S/w pt re appt for 10/2 @ 9am w/NG. Per email from [facility] pt saw him for br ca and [facility] for h/n ca. GM would like pt to be scheduled w/NG for new lung ca. Ok per NG (email from [place]).

## 2012-10-13 ENCOUNTER — Other Ambulatory Visit: Payer: Self-pay | Admitting: Oncology

## 2012-10-14 ENCOUNTER — Other Ambulatory Visit: Payer: Self-pay | Admitting: Oncology

## 2012-10-17 NOTE — Progress Notes (Signed)
This encounter was created in error - please disregard.

## 2012-10-20 ENCOUNTER — Ambulatory Visit (HOSPITAL_BASED_OUTPATIENT_CLINIC_OR_DEPARTMENT_OTHER): Payer: Medicare Other | Admitting: Hematology and Oncology

## 2012-10-20 ENCOUNTER — Encounter: Payer: Self-pay | Admitting: *Deleted

## 2012-10-20 ENCOUNTER — Encounter: Payer: Self-pay | Admitting: Hematology and Oncology

## 2012-10-20 VITALS — BP 157/55 | HR 76 | Temp 97.9°F | Resp 18 | Ht 68.0 in | Wt 109.8 lb

## 2012-10-20 DIAGNOSIS — C030 Malignant neoplasm of upper gum: Secondary | ICD-10-CM

## 2012-10-20 DIAGNOSIS — C770 Secondary and unspecified malignant neoplasm of lymph nodes of head, face and neck: Secondary | ICD-10-CM

## 2012-10-20 DIAGNOSIS — C410 Malignant neoplasm of bones of skull and face: Secondary | ICD-10-CM

## 2012-10-20 DIAGNOSIS — C50919 Malignant neoplasm of unspecified site of unspecified female breast: Secondary | ICD-10-CM

## 2012-10-20 DIAGNOSIS — C50911 Malignant neoplasm of unspecified site of right female breast: Secondary | ICD-10-CM

## 2012-10-20 DIAGNOSIS — C7800 Secondary malignant neoplasm of unspecified lung: Secondary | ICD-10-CM

## 2012-10-20 NOTE — Progress Notes (Signed)
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 female accompanied by her husband today. She had background history of breast cancer on the right found on diagnostic mammogram in 2011. She is previous patient of Dr. Dorene Sorrow met. The patient underwent right mastectomy and sentinel lymph node biopsy which showed a T1 CN 0M0 ER/PR positive HER-2/neu negative invasive ductal carcinoma. She was treated with adjuvant endocrine therapy with letrozole. In January 2014, she complained of some jaw pain throat pain and an enlarged cervical lymph node. She saw Dr. Lazarus Salines who did a biopsy of the right maxillary alveolus which came back positive for invasive squamous cell carcinoma. CT scan show possible regional lymph node involvement. She went to [facility] and had surgery. On 03/18/2012 she underwent maxillectomy, right neck dissection, and flap reconstruction at [facility]. Unfortunately she had positive margins as well as regional lymph node involvement. There were presence of lymphovascular invasion and perineural invasion. The size of the tumor was 5 cm in greatest dimension final staging was T4, N1, M0. She returned to [facility] for further evaluation after surgery. The patient has canceled her appointments to See a Medical Oncologist [facility]. Ultimately, she underwent adjuvant radiation therapy only. In September 2014, repeat PET/CT scan showed evidence of metastatic disease to the lungs. She's been referred here for further management. The patient's does not engage in conversation today. She is currently on nutritional feeding via feeding tube and she has lost significant amount of weight recently due to diarrhea. The formula of her feeding tube was recently changed. Due to difficulties eating  and swallowing, she has not been taking her Femara. She also developed yeast infection in her mouth and currently is on treatment for that. She denies any cough or chest pain. No new neurological deficits. She is to have persistent dry mouth and swallowing difficulties.  MEDICAL HISTORY:  Past Medical History  Diagnosis Date  . Hypertension   . Hyperlipidemia   . Breast cancer, right breast 08/28/2009  . Use of letrozole (Femara) Since October 2012  . 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 Gout, unspecified   . 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 G tube feedings   . Wound infection after surgery 04/14/12    Opened and Packed - Wet to Dry  . Glaucoma   . Oral aphthae   . Complications affecting other specified body systems, hypertension   . Chronic angle-closure glaucoma(365.23)   . Pure hyperglyceridemia   . Allergic rhinitis due to pollen   . Malignant neoplasm of breast (female), unspecified site   . Thyroid disease   . Unspecified constipation   . Hypopotassemia   . Enlargement of lymph nodes   . Cough   . Cough   . Acute sinusitis, unspecified   . Other abnormal blood chemistry   . Unspecified vitamin D deficiency   . Other and unspecified hyperlipidemia   . Gout, unspecified   . Unspecified essential hypertension   . Unspecified arthropathy, lower leg   . Other abnormal blood chemistry   . S/P radiation therapy 05/02/2012-06/17/2012      Right Maxillary Tumor Bed and Bilateral Neck / 66 Gy in 33 fractions    SURGICAL HISTORY: Past  Surgical History  Procedure Laterality Date  . Simple mastectomy w/ sentinel node biopsy Right 10/09/2009    Breast  . Soft tissue biopsy, right maxillary alveolus  02/12/2012    Invasive Squamous Cell  . Abdominal hysterectomy  1960's    BSO, bleeding  . Cataract surgery      bilat  . Tracheotomy and left selective neck dissection Left  03/18/12     [facility]: Left Selective Neck Dissection/Left Infrastructure Maxillectomy, Palatoplasty, Flap Fasciocutaneous Free Flap Reconstruction of Left Maxillary Defect. Split Thickness Skin Graft of Left Forearm Defect Measuring 10/14/cm  . Dobhoff tube placement  03/18/12  . Breast surgery      SOCIAL HISTORY: History   Social History  . Marital Status: Married    Spouse Name: N/A    Number of Children: N/A  . Years of Education: N/A   Occupational History  . Not on file.   Social History Main Topics  . Smoking status: Former Smoker -- 0.50 packs/day for 4.5 years    Types: Cigarettes    Quit date: 02/22/2009  . Smokeless tobacco: Never Used     Comment: for unknown # yrs  . Alcohol Use: Yes     Comment: Socially- "less than one drink per day"  . Drug Use: No  . Sexual Activity: Not on file   Other Topics Concern  . Not on file   Social History Narrative  . No narrative on file    FAMILY HISTORY: Family History  Problem Relation Age of Onset  . Heart disease Mother     CAD  . Stroke Mother   . Cancer Mother     Breast  . Cancer Sister     breast  . Heart attack Father   . Heart attack Sister   . Heart disease Sister   . Heart attack Brother     ALLERGIES:  has No Known Allergies.  MEDICATIONS: Current outpatient prescriptions:diphenoxylate-atropine (LOMOTIL) 2.5-0.025 MG per tablet, Take 1 tablet by mouth 4 (four) times daily as needed for diarrhea or loose stools., Disp: 60 tablet, Rfl: 3;  HYDROcodone-acetaminophen (NORCO/VICODIN) 5-325 MG per tablet, Take 1 tablet by mouth every 8 (eight) hours as needed for pain., Disp: 45 tablet, Rfl: 3 letrozole (FEMARA) 2.5 MG tablet, Take 1 tablet (2.5 mg total) by mouth daily., Disp: 30 tablet, Rfl: 11;  loperamide (IMODIUM) 1 MG/5ML solution, Take 10 mLs (2 mg total) by mouth 4 (four) times daily as needed for diarrhea or loose stools., Disp: 120 mL, Rfl: 5;  lovastatin (ALTOPREV) 40 MG 24 hr tablet, Take 40 mg  by mouth at bedtime., Disp: , Rfl:  Nutritional Supplements (FEEDING SUPPLEMENT, VITAL 1.5 CAL,) LIQD, Begin Vital 1.5 via feeding tube with 1 can 5 times daily with 90 ml free water before & after bolus feeding.  Send formula ASAP & RN for tube feeding teaching on appropriate administration.  D/C Ensure Plus and Jevity Plus by mouth and tube., Disp: 1185 mL, Rfl:  nystatin (MYCOSTATIN) 100000 UNIT/ML suspension, Take 5 mLs (500,000 Units total) by mouth 4 (four) times daily. Swish in mouth and swallow. Use up bottle., Disp: 240 mL, Rfl: 0;  psyllium (METAMUCIL SMOOTH TEXTURE) 58.6 % powder, Take 1 packet by mouth 3 (three) times daily., Disp: 283 g, Rfl: 12 sodium fluoride (FLUORISHIELD) 1.1 % GEL dental gel, Instill one drop of fluoride per tooth space of fluoride tray. Place over teeth for 5 minutes. Remove. Spit out excess. Repeat nightly., Disp: 120 mL, Rfl:  prn;  ALPHAGAN P 0.1 % SOLN, Place 1 drop into both eyes 2 (two) times daily. , Disp: , Rfl: ;  aspirin 81 MG tablet, Take 81 mg by mouth daily. , Disp: , Rfl: ;  colchicine 0.6 MG tablet, Take 0.6 mg by mouth daily., Disp: , Rfl:  dorzolamide-timolol (COSOPT) 22.3-6.8 MG/ML ophthalmic solution, Place 1 drop into both eyes 2 (two) times daily. , Disp: , Rfl: ;  mirtazapine (REMERON SOLTAB) 15 MG disintegrating tablet, Take 1 tablet (15 mg total) by mouth at bedtime., Disp: 30 tablet, Rfl: 3;  nebivolol (BYSTOLIC) 5 MG tablet, Take 1 tablet (5 mg total) by mouth daily., Disp: 30 tablet, Rfl: 3  REVIEW OF SYSTEMS:   Constitutional: Denies fevers, chills  Eyes: Denies blurriness of vision, double vision or watery eyest Respiratory: Denies cough, dyspnea or wheezes Cardiovascular: Denies palpitation, chest discomfort or lower extremity swelling Gastrointestinal:  Denies nausea, heartburn or change in bowel habits Skin: Denies abnormal skin rashes Lymphatics: Denies new lymphadenopathy or easy bruising Neurological:Denies numbness, tingling or new  weaknesses Behavioral/Psych: Mood is stable, no new changes  All other systems were reviewed with the patient and are negative.  PHYSICAL EXAMINATION: ECOG PERFORMANCE STATUS: 1 - Symptomatic but completely ambulatory  Filed Vitals:   10/20/12 0917  BP: 157/55  Pulse: 76  Temp: 97.9 F (36.6 C)  Resp: 18   Filed Weights   10/20/12 0917  Weight: 109 lb 12.8 oz (49.805 kg)    GENERAL:alert, no distress and comfortable the patient appears thin and mildly cachectic SKIN: skin color, texture, turgor are normal, no rashes or significant lesions EYES: normal, conjunctiva are pink and non-injected, sclera clear OROPHARYNX:no exudate, noted oral thrush on the hard palate  NECK: supple, thyroid normal size, non-tender, with palpable nodules around the site of her previous surgery LYMPH:  palpable lymphadenopathy in the cervical region but non-in the, axillary or inguinal regions LUNGS: clear to auscultation and percussion with normal breathing effort HEART: regular rate & rhythm and no murmurs and no lower extremity edema ABDOMEN:abdomen soft, non-tender and normal bowel sounds feeding tube looks okay Musculoskeletal:no cyanosis of digits and no clubbing  PSYCH: alert & oriented x 3 with difficulties with articulation due to her surgery NEURO: no focal motor/sensory deficits  LABORATORY DATA:  I have reviewed the data as listed Recent Results (from the past 2160 hour(s))  CBC WITH DIFFERENTIAL     Status: Abnormal   Collection Time    09/07/12  7:50 PM      Result Value Range   WBC 4.7  4.0 - 10.5 K/uL   RBC 3.87  3.87 - 5.11 MIL/uL   Hemoglobin 8.3 (*) 12.0 - 15.0 g/dL   HCT 16.1 (*) 09.6 - 04.5 %   MCV 69.8 (*) 78.0 - 100.0 fL   MCH 21.4 (*) 26.0 - 34.0 pg   MCHC 30.7  30.0 - 36.0 g/dL   RDW 40.9 (*) 81.1 - 91.4 %   Platelets 259  150 - 400 K/uL   Comment: REPEATED TO VERIFY   Neutrophils Relative % 72  43 - 77 %   Lymphocytes Relative 21  12 - 46 %   Monocytes Relative 7   3 - 12 %   Eosinophils Relative 0  0 - 5 %   Basophils Relative 0  0 - 1 %   Neutro Abs 3.4  1.7 - 7.7 K/uL   Lymphs Abs 1.0  0.7 - 4.0 K/uL   Monocytes Absolute 0.3  0.1 - 1.0 K/uL   Eosinophils Absolute 0.0  0.0 - 0.7 K/uL   Basophils Absolute 0.0  0.0 - 0.1 K/uL   RBC Morphology TARGET CELLS     Comment: STOMATOCYTES  COMPREHENSIVE METABOLIC PANEL     Status: Abnormal   Collection Time    09/07/12  7:50 PM      Result Value Range   Sodium 138  135 - 145 mEq/L   Potassium 3.6  3.5 - 5.1 mEq/L   Chloride 103  96 - 112 mEq/L   CO2 25  19 - 32 mEq/L   Glucose, Bld 99  70 - 99 mg/dL   BUN 25 (*) 6 - 23 mg/dL   Creatinine, Ser 4.54  0.50 - 1.10 mg/dL   Calcium 9.7  8.4 - 09.8 mg/dL   Total Protein 7.2  6.0 - 8.3 g/dL   Albumin 3.6  3.5 - 5.2 g/dL   AST 17  0 - 37 U/L   ALT 13  0 - 35 U/L   Alkaline Phosphatase 75  39 - 117 U/L   Total Bilirubin 0.3  0.3 - 1.2 mg/dL   GFR calc non Af Amer 47 (*) >90 mL/min   GFR calc Af Amer 54 (*) >90 mL/min   Comment: (NOTE)     The eGFR has been calculated using the CKD EPI equation.     This calculation has not been validated in all clinical situations.     eGFR's persistently <90 mL/min signify possible Chronic Kidney     Disease.  LIPASE, BLOOD     Status: Abnormal   Collection Time    09/07/12  7:50 PM      Result Value Range   Lipase 85 (*) 11 - 59 U/L  CG4 I-STAT (LACTIC ACID)     Status: None   Collection Time    09/07/12  8:46 PM      Result Value Range   Lactic Acid, Venous 1.30  0.5 - 2.2 mmol/L  URINALYSIS, ROUTINE W REFLEX MICROSCOPIC     Status: Abnormal   Collection Time    09/07/12 11:10 PM      Result Value Range   Color, Urine YELLOW  YELLOW   APPearance CLEAR  CLEAR   Specific Gravity, Urine 1.027  1.005 - 1.030   pH 5.0  5.0 - 8.0   Glucose, UA NEGATIVE  NEGATIVE mg/dL   Hgb urine dipstick NEGATIVE  NEGATIVE   Bilirubin Urine SMALL (*) NEGATIVE   Ketones, ur NEGATIVE  NEGATIVE mg/dL   Protein, ur NEGATIVE   NEGATIVE mg/dL   Urobilinogen, UA 1.0  0.0 - 1.0 mg/dL   Nitrite NEGATIVE  NEGATIVE   Leukocytes, UA NEGATIVE  NEGATIVE   Comment: MICROSCOPIC NOT DONE ON URINES WITH NEGATIVE PROTEIN, BLOOD, LEUKOCYTES, NITRITE, OR GLUCOSE <1000 mg/dL.  GLUCOSE, CAPILLARY     Status: None   Collection Time    09/23/12  9:57 AM      Result Value Range   Glucose-Capillary 71  70 - 99 mg/dL    RADIOGRAPHIC STUDIES: I have personally reviewed the radiological images as listed and agreed with the findings in the report. Ct Soft Tissue Neck W Contrast  09/23/2012   *RADIOLOGY REPORT*  Clinical Data: Squamous cell carcinoma maxillary ridge.  Breast cancer.  CT NECK WITH CONTRAST  Technique:  Multidetector CT imaging of the neck was performed with intravenous contrast.  Contrast: OMNIPAQUE IOHEXOL 300 MG/ML  SOLN  Comparison: 02/23/2012 CTand  PET CT.  09/23/2012 PET CT.  Findings: Surgical resection of posterior right maxilla mass with resection of the mid to posterior right alveolar ridge. Soft tissue in this region may represent postoperative changes/ inflammation although tumor not entirely excluded.  On the PET CT performed same date, there is low-level radiotracer uptake in this region. Attention to this on follow-up (series 2 image 119).  Resection of previously noted necrotic right submandibular adenopathy. No necrotic adenopathy presently detected.  Post radiation therapy type changes with inflammation of the right parotid gland and haziness of fat planes greater on the right.  Opacification of the right mastoid air cells and middle ear cavity suggestive of result of right sided eustachian tube dysfunction probably caused by surgery/radiation therapy.  Left upper lobe 9 mm and 5 mm nodules suggestive of metastatic disease. These areas a are noted to be hypermetabolic and consistent with metastatic disease.  Sub centimeter low density lesions in the thyroid gland bilaterally.  Atherosclerotic type changes carotid  bifurcation with moderate narrowing greater on the right.  Cervical spondylotic changes with spinal stenosis and cord flattening C3-4 through C5-6.  IMPRESSION: Surgical resection of posterior right maxilla mass with resection of the mid to posterior right alveolar ridge. Soft tissue in this region may represent postoperative changes/ inflammation although tumor not entirely excluded.  On the PET CT performed same date, there is low-level radiotracer uptake in this region.  Attention to this on follow-up (series 2 image 119).  Resection of previously noted necrotic right submandibular adenopathy. No necrotic adenopathy presently detected.  Opacification of the right mastoid air cells and middle ear cavity suggestive of result of right sided eustachian tube dysfunction probably caused by surgery/radiation therapy.  Left upper lobe 9 mm and 5 mm nodules s consistent with metastatic disease. These areas a are noted to be hypermetabolic and consistent with metastatic disease.  Atherosclerotic type changes carotid bifurcation with moderate narrowing greater on the right.   Original Report Authenticated By: Lacy Duverney, M.D.   Nm Pet Image Restag (ps) Skull Base To Thigh  09/23/2012   CLINICAL DATA:  Subsequent treatment strategy for squamous cell carcinoma of the maxillary alveolar ridge.  EXAM: NUCLEAR MEDICINE PET SKULL BASE TO THIGH  TECHNIQUE: 16.0 mCi F-18 FDG was injected intravenously. CT data was obtained and used for attenuation correction and anatomic localization only. (This was not acquired as a diagnostic CT examination.) Additional exam technical data entered on technologist worksheet.  COMPARISON:  02/22/2012  FINDINGS: NECK  There has been interval surgical resection of the previously seen hypermetabolic mass centered in the right maxillary alveolar ridge. No residual hypermetabolic mass seen at this site. There has also been interval resolution of hypermetabolic right submandibular lymphadenopathy  since prior exam. No residual hypermetabolic lymphadenopathy identified within the neck.  CHEST  Several small less than 1 cm pulmonary nodules are now seen in the left upper lobe which show hypermetabolic activity, consistent with pulmonary metastases. A tiny less than 5 mm central right upper lobe pulmonary nodule is seen on image 72 which shows faint hypermetabolic activity, also suspicious for a pulmonary metastasis. No hypermetabolic hilar or mediastinal lymph nodes identified.  ABDOMEN/PELVIS  No abnormal hypermetabolic activity within the liver, pancreas, adrenal glands, or spleen. No hypermetabolic lymph nodes in the abdomen or pelvis. A large approximate 10 cm simple appearing cystic lesion is again seen in the left pelvis which is stable and shows no hypermetabolic activity.  SKELETON  no focal hypermetabolic activity to suggest skeletal metastasis.  IMPRESSION: No residual malignancy identified in the maxilla or neck.  New sub cm hypermetabolic bilateral upper lobe pulmonary nodules, consistent with pulmonary metastases.  No evidence of abdominal or pelvic metastatic disease. Stable benign appearing cystic lesion in the left pelvis.  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 The patient has prior history of breast cancer, however, it was of low grade disease. I suspect the pulmonary metastasis seen is not from breast cancer, rather the likely secondary to recurrent cancer from her maxilla. The patient never received adjuvant chemotherapy and has consult appointment with medical oncologist Morales times. My interaction with her today is awkward. She does not engage in  eye contact and appears depressed. I told her she may need to start palliative chemotherapy soon. The size of the disease seen are small, however the scan is now almost a month old. She has recurrence of disease within 6 months of her surgery which predict a very aggressive behavior. Without chemotherapy soon, her cancer will continue to spread to other areas which may compromise organ function in the near future. The patient wanted to think about it. She is not clear she wanted to pursue any form of systemic treatment. I recommend she return next week with family members so that we can have a family meeting to discuss risks, benefits, and side effects of treatment as well as prognosis. She is in agreement. #3 history of breast cancer The patient has not taken Femara for a while. Given the poor prognosis seen with her cancer above, I do not think it is reasonable to stop Femara at this point #4 thrush She's currently on treatment for this #5 malnutrition She will continue her nutritional feeding under the guidance of our nutritionist.  All questions were answered. The patient knows to call the clinic with any problems, questions or concerns. I can certainly see the patient much sooner if necessary. No barriers to learning was detected.  I spent 40 minutes counseling the patient face to face. The total time spent in the appointment was 60 minutes and more than 50% was on counseling.     [facility], Marinna Blane, MD 10/20/2012 9:41 PM

## 2012-10-20 NOTE — Progress Notes (Signed)
Met with pt and her husband during scheduled consult with Dr. Bertis Ruddy.  Navigation status unclear at this time pending additional evaluation.  Young Berry, RN, BSN, [facility] Navigator [phone]

## 2012-10-21 ENCOUNTER — Ambulatory Visit (HOSPITAL_COMMUNITY): Payer: Medicare Other

## 2012-10-21 ENCOUNTER — Inpatient Hospital Stay (HOSPITAL_COMMUNITY): Admission: RE | Admit: 2012-10-21 | Payer: Self-pay | Source: Ambulatory Visit

## 2012-10-24 ENCOUNTER — Telehealth: Payer: Self-pay | Admitting: Hematology and Oncology

## 2012-10-24 NOTE — Telephone Encounter (Signed)
s.w. pt husband and advised on 10.7.14 appt.Marland KitchenMarland Kitchen

## 2012-10-25 ENCOUNTER — Ambulatory Visit (HOSPITAL_BASED_OUTPATIENT_CLINIC_OR_DEPARTMENT_OTHER): Payer: Medicare Other | Admitting: Hematology and Oncology

## 2012-10-25 ENCOUNTER — Telehealth: Payer: Self-pay | Admitting: Hematology and Oncology

## 2012-10-25 VITALS — BP 134/59 | HR 93 | Temp 98.4°F | Resp 18 | Ht 68.0 in | Wt 113.5 lb

## 2012-10-25 DIAGNOSIS — C760 Malignant neoplasm of head, face and neck: Secondary | ICD-10-CM

## 2012-10-25 DIAGNOSIS — C7800 Secondary malignant neoplasm of unspecified lung: Secondary | ICD-10-CM

## 2012-10-25 DIAGNOSIS — C50911 Malignant neoplasm of unspecified site of right female breast: Secondary | ICD-10-CM

## 2012-10-25 DIAGNOSIS — C030 Malignant neoplasm of upper gum: Secondary | ICD-10-CM

## 2012-10-25 NOTE — Telephone Encounter (Signed)
gv and printed aptp sched and avs for pt for OCT...emailed MW to add tx.

## 2012-10-25 NOTE — Progress Notes (Signed)
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 The patient underwent right mastectomy and sentinel lymph node biopsy which showed a T1 CN 0M0 ER/PR positive HER-2/neu negative invasive ductal carcinoma. She was treated with adjuvant endocrine therapy with letrozole. 2) In January 2014, she complained of some jaw pain throat pain and an enlarged cervical lymph node. She saw Dr. Lazarus Salines who did a biopsy of the right maxillary alveolus which came back positive for invasive squamous cell carcinoma. CT scan show possible regional lymph node involvement. She went to [facility] and had surgery.  3) On 03/18/2012 she underwent maxillectomy, right neck dissection, and flap reconstruction at [facility]. Unfortunately she had positive margins as well as regional lymph node involvement. There were presence of lymphovascular invasion and perineural invasion. The size of the tumor was 5 cm in greatest dimension final staging was T4, N1, M0. 4) She returned to [facility] for further evaluation after surgery. The patient has canceled her appointments to See a Medical Oncologist Many Times. Ultimately, she underwent adjuvant radiation therapy only. 5) In September 2014, repeat PET/CT scan showed evidence of metastatic disease to the lungs. She's been referred here for further management. INTERVAL HISTORY: Kristine Morales 79 y.o. female returns for further followup rate relating to her history of metastatic squamous cell carcinoma to her lungs. From our previous visit, she was very depressed and not engaging in conversation. Today, she is interested to hear palliative treatment options. In the  meantime, she is to have difficulties eating and swallowing. However she is able to maintain her weight and actually gained some weight since the last time I saw her through her tube feeding. Denies any cough or chest pain. Her oral thrush has resolved.  I have reviewed the past medical history, past surgical history, social history and family history with the patient and they are unchanged from previous note.  ALLERGIES:  has No Known Allergies.  MEDICATIONS: Current outpatient prescriptions:diphenoxylate-atropine (LOMOTIL) 2.5-0.025 MG per tablet, Take 1 tablet by mouth 4 (four) times daily as needed for diarrhea or loose stools., Disp: 60 tablet, Rfl: 3;  HYDROcodone-acetaminophen (NORCO/VICODIN) 5-325 MG per tablet, Take 1 tablet by mouth every 8 (eight) hours as needed for pain., Disp: 45 tablet, Rfl: 3 loperamide (IMODIUM) 1 MG/5ML solution, Take 10 mLs (2 mg total) by mouth 4 (four) times daily as needed for diarrhea or loose stools., Disp: 120 mL, Rfl: 5 Nutritional Supplements (FEEDING SUPPLEMENT, VITAL 1.5 CAL,) LIQD, Begin Vital 1.5 via feeding tube with 1 can 5 times daily with 90 ml free water before & after bolus feeding.  Send formula ASAP & RN for tube feeding teaching on appropriate administration.  D/C Ensure Plus and Jevity Plus by mouth and tube., Disp: 1185 mL, Rfl:  nystatin (MYCOSTATIN) 100000 UNIT/ML suspension, Take 5 mLs (500,000 Units total) by mouth 4 (four) times daily. Swish in mouth and swallow. Use up bottle., Disp: 240 mL, Rfl: 0;  psyllium (METAMUCIL SMOOTH TEXTURE) 58.6 % powder, Take 1 packet by mouth 3 (three) times daily., Disp: 283 g, Rfl: 12 sodium fluoride (FLUORISHIELD) 1.1 % GEL dental gel, Instill one drop of fluoride per tooth space of fluoride tray. Place over teeth for 5 minutes. Remove.  Spit out excess. Repeat nightly., Disp: 120 mL, Rfl: prn;  ALPHAGAN P 0.1 % SOLN, Place 1 drop into both eyes 2 (two) times daily. , Disp: , Rfl: ;  aspirin 81 MG tablet, Take  81 mg by mouth daily. , Disp: , Rfl: ;  colchicine 0.6 MG tablet, Take 0.6 mg by mouth daily., Disp: , Rfl:  dorzolamide-timolol (COSOPT) 22.3-6.8 MG/ML ophthalmic solution, Place 1 drop into both eyes 2 (two) times daily. , Disp: , Rfl: ;  letrozole (FEMARA) 2.5 MG tablet, Take 1 tablet (2.5 mg total) by mouth daily., Disp: 30 tablet, Rfl: 11;  lovastatin (ALTOPREV) 40 MG 24 hr tablet, Take 40 mg by mouth at bedtime., Disp: , Rfl:  mirtazapine (REMERON SOLTAB) 15 MG disintegrating tablet, Take 1 tablet (15 mg total) by mouth at bedtime., Disp: 30 tablet, Rfl: 3;  nebivolol (BYSTOLIC) 5 MG tablet, Take 1 tablet (5 mg total) by mouth daily., Disp: 30 tablet, Rfl: 3  REVIEW OF SYSTEMS:   Constitutional: Denies fevers, chills or abnormal weight loss All other systems were reviewed with the patient and are negative.  PHYSICAL EXAMINATION: ECOG PERFORMANCE STATUS: 1 - Symptomatic but completely ambulatory  Filed Vitals:   10/25/12 1511  BP: 134/59  Pulse: 93  Temp: 98.4 F (36.9 C)  Resp: 18   Filed Weights   10/25/12 1511  Weight: 113 lb 8 oz (51.483 kg)   GENERAL:alert, no distress and comfortable NEURO: alert & oriented x 3 with fluent speech, no focal motor/sensory deficits  LABORATORY DATA:  I have reviewed the data as listed    Component Value Date/Time   NA 138 09/07/2012 1950   NA 139 02/09/2012 0919   K 3.6 09/07/2012 1950   K 3.9 02/09/2012 0919   CL 103 09/07/2012 1950   CL 104 02/09/2012 0919   CO2 25 09/07/2012 1950   CO2 24 02/09/2012 0919   GLUCOSE 99 09/07/2012 1950   GLUCOSE 142* 02/09/2012 0919   BUN 25* 09/07/2012 1950   BUN 25.0 02/09/2012 0919   CREATININE 1.09 09/07/2012 1950   CREATININE 1.1 02/09/2012 0919   CALCIUM 9.7 09/07/2012 1950   CALCIUM 10.0 02/09/2012 0919   PROT 7.2 09/07/2012 1950   PROT 7.7 02/09/2012 0919   ALBUMIN 3.6 09/07/2012 1950   ALBUMIN 3.4* 02/09/2012 0919   AST 17 09/07/2012 1950   AST 19 02/09/2012 0919   ALT 13 09/07/2012 1950   ALT 11  02/09/2012 0919   ALKPHOS 75 09/07/2012 1950   ALKPHOS 101 02/09/2012 0919   BILITOT 0.3 09/07/2012 1950   BILITOT 0.76 02/09/2012 0919   GFRNONAA 47* 09/07/2012 1950   GFRAA 54* 09/07/2012 1950    No results found for this basename: SPEP, UPEP,  kappa and lambda light chains    Lab Results  Component Value Date   WBC 4.7 09/07/2012   NEUTROABS 3.4 09/07/2012   HGB 8.3* 09/07/2012   HCT 27.0* 09/07/2012   MCV 69.8* 09/07/2012   PLT 259 09/07/2012      Chemistry      Component Value Date/Time   NA 138 09/07/2012 1950   NA 139 02/09/2012 0919   K 3.6 09/07/2012 1950   K 3.9 02/09/2012 0919   CL 103 09/07/2012 1950   CL 104 02/09/2012 0919   CO2 25 09/07/2012 1950   CO2 24 02/09/2012 0919   BUN 25* 09/07/2012 1950   BUN 25.0 02/09/2012 0919   CREATININE 1.09 09/07/2012 1950   CREATININE 1.1 02/09/2012 0919  Component Value Date/Time   CALCIUM 9.7 09/07/2012 1950   CALCIUM 10.0 02/09/2012 0919   ALKPHOS 75 09/07/2012 1950   ALKPHOS 101 02/09/2012 0919   AST 17 09/07/2012 1950   AST 19 02/09/2012 0919   ALT 13 09/07/2012 1950   ALT 11 02/09/2012 0919   BILITOT 0.3 09/07/2012 1950   BILITOT 0.76 02/09/2012 0919     ASSESSMENT: Recurrent, 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 We discussed what would happen if she does not undergo systemic treatment. For her, I would recommend weekly carboplatin and paclitaxel which is a reasonably well-tolerated regimen for head and neck cancer. We briefly discussed some of the side effects to be expected such as risk of allergic reaction, alopecia, peripheral neuropathy, risk of pancytopenia and others. I will reorder a repeat PET scan as her prior scan is more than a month old. I will recommend placement of Infuse-a-Port and chemotherapy education classes for her. I plan to see her back in 2  weeks to consent her for chemotherapy and plan on starting her treatment afterwards. #2 malnutrition We'll continue nutritional feeding through the feeding tube under the guidance of our nutritionists.  All questions were answered. The patient knows to call the clinic with any problems, questions or concerns. We can certainly see the patient much sooner if necessary. No barriers to learning was detected.   [facility] Ms, Jj Enyeart, MD 10/25/2012 5:01 PM

## 2012-10-26 ENCOUNTER — Other Ambulatory Visit: Payer: Self-pay | Admitting: Oncology

## 2012-10-26 ENCOUNTER — Telehealth: Payer: Self-pay | Admitting: *Deleted

## 2012-10-26 ENCOUNTER — Telehealth: Payer: Self-pay | Admitting: Hematology and Oncology

## 2012-10-26 NOTE — Telephone Encounter (Signed)
Per staff message and POF I have scheduled appts.  JMW  

## 2012-10-26 NOTE — Telephone Encounter (Signed)
s.w. pt to advised on oct appts///mailed pt appt sched and avs with letter

## 2012-10-28 ENCOUNTER — Other Ambulatory Visit: Payer: Self-pay | Admitting: Radiology

## 2012-10-31 ENCOUNTER — Encounter (HOSPITAL_COMMUNITY): Payer: Self-pay | Admitting: Pharmacy Technician

## 2012-10-31 ENCOUNTER — Other Ambulatory Visit: Payer: Self-pay | Admitting: Radiology

## 2012-11-02 ENCOUNTER — Ambulatory Visit (HOSPITAL_COMMUNITY): Admission: RE | Admit: 2012-11-02 | Payer: Medicare Other | Source: Ambulatory Visit

## 2012-11-02 ENCOUNTER — Encounter (HOSPITAL_COMMUNITY): Payer: Self-pay | Admitting: Pharmacy Technician

## 2012-11-03 ENCOUNTER — Other Ambulatory Visit: Payer: Self-pay | Admitting: Radiology

## 2012-11-03 ENCOUNTER — Encounter: Payer: Self-pay | Admitting: *Deleted

## 2012-11-03 ENCOUNTER — Other Ambulatory Visit: Payer: Self-pay | Admitting: *Deleted

## 2012-11-03 ENCOUNTER — Other Ambulatory Visit: Payer: Medicare Other

## 2012-11-03 MED ORDER — PROCHLORPERAZINE MALEATE 10 MG PO TABS: 10.0000 mg | ORAL_TABLET | Freq: Four times a day (QID) | ORAL | Status: DC | PRN

## 2012-11-03 MED ORDER — ONDANSETRON HCL 8 MG PO TABS: 8.0000 mg | ORAL_TABLET | Freq: Two times a day (BID) | ORAL | Status: DC

## 2012-11-03 MED ORDER — LORAZEPAM 0.5 MG PO TABS: 0.5000 mg | ORAL_TABLET | Freq: Four times a day (QID) | ORAL | Status: AC | PRN

## 2012-11-03 MED ORDER — LORAZEPAM 0.5 MG PO TABS: 0.5000 mg | ORAL_TABLET | Freq: Three times a day (TID) | ORAL | Status: DC

## 2012-11-03 MED ORDER — DEXAMETHASONE 4 MG PO TABS: 8.0000 mg | ORAL_TABLET | Freq: Two times a day (BID) | ORAL | Status: DC

## 2012-11-03 MED ORDER — LIDOCAINE-PRILOCAINE 2.5-2.5 % EX CREA: TOPICAL_CREAM | CUTANEOUS | Status: AC | PRN

## 2012-11-07 ENCOUNTER — Encounter: Payer: Self-pay | Admitting: *Deleted

## 2012-11-07 ENCOUNTER — Ambulatory Visit: Payer: Medicare Other

## 2012-11-07 ENCOUNTER — Ambulatory Visit (HOSPITAL_COMMUNITY)
Admission: RE | Admit: 2012-11-07 | Discharge: 2012-11-07 | Disposition: A | Discharge status: Home / Self Care | Payer: Medicare Other | Source: Ambulatory Visit | Attending: Hematology and Oncology | Admitting: Hematology and Oncology

## 2012-11-07 ENCOUNTER — Ambulatory Visit: Payer: Self-pay

## 2012-11-07 ENCOUNTER — Other Ambulatory Visit: Payer: Self-pay | Admitting: Hematology and Oncology

## 2012-11-07 ENCOUNTER — Encounter (HOSPITAL_COMMUNITY): Payer: Self-pay

## 2012-11-07 DIAGNOSIS — C50911 Malignant neoplasm of unspecified site of right female breast: Secondary | ICD-10-CM

## 2012-11-07 DIAGNOSIS — E785 Hyperlipidemia, unspecified: Secondary | ICD-10-CM | POA: Insufficient documentation

## 2012-11-07 DIAGNOSIS — C760 Malignant neoplasm of head, face and neck: Secondary | ICD-10-CM

## 2012-11-07 DIAGNOSIS — C50919 Malignant neoplasm of unspecified site of unspecified female breast: Secondary | ICD-10-CM | POA: Insufficient documentation

## 2012-11-07 DIAGNOSIS — Z87891 Personal history of nicotine dependence: Secondary | ICD-10-CM | POA: Insufficient documentation

## 2012-11-07 DIAGNOSIS — I10 Essential (primary) hypertension: Secondary | ICD-10-CM | POA: Insufficient documentation

## 2012-11-07 DIAGNOSIS — C7951 Secondary malignant neoplasm of bone: Secondary | ICD-10-CM | POA: Insufficient documentation

## 2012-11-07 LAB — CBC
HCT: 26.4 % — ABNORMAL LOW (ref 36.0–46.0)
Hemoglobin: 8.5 g/dL — ABNORMAL LOW (ref 12.0–15.0)
Platelets: 252 10*3/uL (ref 150–400)
RDW: 16 % — ABNORMAL HIGH (ref 11.5–15.5)
WBC: 4.4 10*3/uL (ref 4.0–10.5)

## 2012-11-07 LAB — APTT: aPTT: 32 seconds (ref 24–37)

## 2012-11-07 LAB — PROTIME-INR: INR: 1 (ref 0.00–1.49)

## 2012-11-07 MED ORDER — SODIUM CHLORIDE 0.9 % IV SOLN
INTRAVENOUS | Status: DC
  Administered 2012-11-07: 20 mL/h via INTRAVENOUS

## 2012-11-07 MED ORDER — MIDAZOLAM HCL 2 MG/2ML IJ SOLN
INTRAMUSCULAR | Status: AC
  Filled 2012-11-07: qty 6

## 2012-11-07 MED ORDER — FENTANYL CITRATE 0.05 MG/ML IJ SOLN
INTRAMUSCULAR | Status: AC
  Filled 2012-11-07: qty 6

## 2012-11-07 MED ORDER — CEFAZOLIN SODIUM-DEXTROSE 2-3 GM-% IV SOLR
2.0000 g | INTRAVENOUS | Status: AC
  Administered 2012-11-07: 2 g via INTRAVENOUS

## 2012-11-07 MED ORDER — FENTANYL CITRATE 0.05 MG/ML IJ SOLN
INTRAMUSCULAR | Status: AC | PRN
  Administered 2012-11-07: 25 ug via INTRAVENOUS
  Administered 2012-11-07: 50 ug via INTRAVENOUS

## 2012-11-07 MED ORDER — HEPARIN SOD (PORK) LOCK FLUSH 100 UNIT/ML IV SOLN
INTRAVENOUS | Status: AC
  Filled 2012-11-07: qty 5

## 2012-11-07 MED ORDER — LIDOCAINE-EPINEPHRINE (PF) 2 %-1:200000 IJ SOLN
INTRAMUSCULAR | Status: AC
  Filled 2012-11-07: qty 20

## 2012-11-07 MED ORDER — LIDOCAINE HCL 1 % IJ SOLN
INTRAMUSCULAR | Status: AC
  Filled 2012-11-07: qty 20

## 2012-11-07 MED ORDER — HEPARIN SOD (PORK) LOCK FLUSH 100 UNIT/ML IV SOLN
INTRAVENOUS | Status: AC | PRN
  Administered 2012-11-07: 500 [IU]

## 2012-11-07 MED ORDER — CEFAZOLIN SODIUM-DEXTROSE 2-3 GM-% IV SOLR
INTRAVENOUS | Status: AC
  Administered 2012-11-07: 2 g via INTRAVENOUS
  Filled 2012-11-07: qty 50

## 2012-11-07 MED ORDER — MIDAZOLAM HCL 2 MG/2ML IJ SOLN
INTRAMUSCULAR | Status: AC | PRN
  Administered 2012-11-07: 0.5 mg via INTRAVENOUS
  Administered 2012-11-07: 1 mg via INTRAVENOUS

## 2012-11-07 NOTE — Progress Notes (Signed)
Call from [facility] in pharmacy, pt has treatment 10/22 will need cmet to check kidney function prior to carbo/taxol.POF done

## 2012-11-07 NOTE — Procedures (Signed)
Successful placement of right jugular portacath.  Tip at SVC/RA junction and ready to use.   No immediate complication.

## 2012-11-07 NOTE — H&P (Signed)
Chief Complaint: "I'm here for a port" Referring Physician:Gorsuch HPI: Kristine Morales is an 79 y.o. female with prior hx of breast cancer and now has metastatic squamous cell cancer of the maxilla. She is to begin chemotherapy soon. She is scheduled for a portacath placement today. She denies recent fever, chills, illness. PMHx and meds reviewed.  Past Medical History:  Past Medical History  Diagnosis Date  . Hypertension   . Hyperlipidemia   . Breast cancer, right breast 08/28/2009  . Use of letrozole (Femara) Since October 2012  . 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 Gout, unspecified   . 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 G tube feedings   . Wound infection after surgery 04/14/12    Opened and Packed - Wet to Dry  . Glaucoma   . Oral aphthae   . Complications affecting other specified body systems, hypertension   . Chronic angle-closure glaucoma(365.23)   . Pure hyperglyceridemia   . Allergic rhinitis due to pollen   . Malignant neoplasm of breast (female), unspecified site   . Thyroid disease   . Unspecified constipation   . Hypopotassemia   . Enlargement of lymph nodes   . Cough   . Cough   . Acute sinusitis, unspecified   . Other abnormal blood chemistry   . Unspecified vitamin D deficiency   . Other and unspecified hyperlipidemia   . Gout, unspecified   . Unspecified essential hypertension   . Unspecified arthropathy, lower leg   . Other abnormal blood chemistry   . S/P radiation therapy 05/02/2012-06/17/2012      Right Maxillary Tumor Bed and Bilateral Neck / 66 Gy in 33 fractions    Past Surgical History:  Past Surgical History  Procedure Laterality Date  . Simple mastectomy w/ sentinel node biopsy Right 10/09/2009    Breast  . Soft tissue biopsy, right maxillary alveolus  02/12/2012    Invasive Squamous Cell  . Abdominal hysterectomy  1960's    BSO, bleeding   . Cataract surgery      bilat  . Tracheotomy and left selective neck dissection Left 03/18/12     [facility]: Left Selective Neck Dissection/Left Infrastructure Maxillectomy, Palatoplasty, Flap Fasciocutaneous Free Flap Reconstruction of Left Maxillary Defect. Split Thickness Skin Graft of Left Forearm Defect Measuring 10/14/cm  . Dobhoff tube placement  03/18/12  . Breast surgery      Family History:  Family History  Problem Relation Age of Onset  . Heart disease Mother     CAD  . Stroke Mother   . Cancer Mother     Breast  . Cancer Sister     breast  . Heart attack Father   . Heart attack Sister   . Heart disease Sister   . Heart attack Brother     Social History:  reports that she quit smoking about 3 years ago. Her smoking use included Cigarettes. She has a 2.25 pack-year smoking history. She has never used smokeless tobacco. She reports that she drinks alcohol. She reports that she does not use illicit drugs.  Allergies: No Known Allergies  Medications:   Medication List    ASK your doctor about these medications       ALPHAGAN P 0.1 % Soln  Generic drug:  brimonidine  Place 1 drop into both eyes 2 (two) times daily.     aspirin 81  MG tablet  Take 81 mg by mouth daily.     colchicine 0.6 MG tablet  Take 0.6 mg by mouth daily.     dexamethasone 4 MG tablet  Commonly known as:  DECADRON  Take 2 tablets (8 mg total) by mouth 2 (two) times daily with a meal. Starting day after chemo for 3 days.     diphenoxylate-atropine 2.5-0.025 MG per tablet  Commonly known as:  LOMOTIL  Take 1 tablet by mouth 4 (four) times daily as needed for diarrhea or loose stools.     dorzolamide-timolol 22.3-6.8 MG/ML ophthalmic solution  Commonly known as:  COSOPT  Place 1 drop into both eyes 2 (two) times daily.     feeding supplement (VITAL 1.5 CAL) Liqd  Place 237 mLs into feeding tube every 3 (three) hours.     HYDROcodone-acetaminophen 5-325 MG per tablet  Commonly known  as:  NORCO/VICODIN  Take 1 tablet by mouth every 6 (six) hours as needed for pain.     letrozole 2.5 MG tablet  Commonly known as:  FEMARA  Take 1 tablet (2.5 mg total) by mouth daily.     lidocaine-prilocaine cream  Commonly known as:  EMLA  Apply topically as needed. Apply to [facility] a Cath site at least one hour prior to needle stick     loperamide 1 MG/5ML solution  Commonly known as:  IMODIUM  Take 10 mLs (2 mg total) by mouth 4 (four) times daily as needed for diarrhea or loose stools.     LORazepam 0.5 MG tablet  Commonly known as:  ATIVAN  Take 1 tablet (0.5 mg total) by mouth every 6 (six) hours as needed (nausea and/or vomiting.).     lovastatin 40 MG 24 hr tablet  Commonly known as:  ALTOPREV  Take 40 mg by mouth at bedtime.     mirtazapine 15 MG disintegrating tablet  Commonly known as:  REMERON SOL-TAB  Take 15 mg by mouth at bedtime.     nebivolol 5 MG tablet  Commonly known as:  BYSTOLIC  Take 1 tablet (5 mg total) by mouth daily.     nystatin 100000 UNIT/ML suspension  Commonly known as:  MYCOSTATIN  Take 500,000 Units by mouth 4 (four) times daily.     ondansetron 8 MG tablet  Commonly known as:  ZOFRAN  Take 1 tablet (8 mg total) by mouth 2 (two) times daily. Starting day after chemo then twice daily as needed for nausea and/or vomiting.     prochlorperazine 10 MG tablet  Commonly known as:  COMPAZINE  Take 1 tablet (10 mg total) by mouth every 6 (six) hours as needed (for nausea and/or vomiting).     sodium fluoride 1.1 % Gel dental gel  Commonly known as:  FLUORISHIELD  Place 1 application onto teeth at bedtime.        Please HPI for pertinent positives, otherwise complete 10 system ROS negative.  Physical Exam: BP 118/59  Pulse 66  Temp(Src) 97.5 F (36.4 C) (Oral)  Resp 18  Ht 5\' 8"  (1.727 m)  Wt 113 lb (51.256 kg)  BMI 17.19 kg/m2  SpO2 100% Body mass index is 17.19 kg/(m^2).   General Appearance:  Alert, cooperative, no distress,  appears stated age  Head:  Normocephalic, without obvious abnormality, atraumatic  ENT: Unremarkable  Neck: Supple, symmetrical, trachea midline  Lungs:   Clear to auscultation bilaterally, no w/r/r,   Chest Wall:  No tenderness or deformity  Heart:  Regular rate  and rhythm, S1, S2 normal, no murmur, rub or gallop.  Neurologic: Normal affect, no gross deficits.   Results for orders placed during the hospital encounter of 11/07/12 (from the past 48 hour(s))  APTT     Status: None   Collection Time    11/07/12  7:48 AM      Result Value Range   aPTT 32  24 - 37 seconds  CBC     Status: Abnormal   Collection Time    11/07/12  7:48 AM      Result Value Range   WBC 4.4  4.0 - 10.5 K/uL   RBC 3.75 (*) 3.87 - 5.11 MIL/uL   Hemoglobin 8.5 (*) 12.0 - 15.0 g/dL   HCT 81.1 (*) 91.4 - 78.2 %   MCV 70.4 (*) 78.0 - 100.0 fL   MCH 22.7 (*) 26.0 - 34.0 pg   MCHC 32.2  30.0 - 36.0 g/dL   RDW 95.6 (*) 21.3 - 08.6 %   Platelets 252  150 - 400 K/uL   Comment: SPECIMEN CHECKED FOR CLOTS     REPEATED TO VERIFY  PROTIME-INR     Status: None   Collection Time    11/07/12  7:48 AM      Result Value Range   Prothrombin Time 13.0  11.6 - 15.2 seconds   INR 1.00  0.00 - 1.49   No results found.  Assessment/Plan Metastatic squamous cell cancer of the maxilla Discussed port placement. Explained procedure, risks, complications, use of sedation. Labs reviewed, ok Consent signed in chart  Brayton El PA-C 11/07/2012, 9:03 AM

## 2012-11-08 ENCOUNTER — Other Ambulatory Visit: Payer: Self-pay | Admitting: Hematology and Oncology

## 2012-11-08 ENCOUNTER — Encounter: Payer: Self-pay | Admitting: Hematology and Oncology

## 2012-11-08 ENCOUNTER — Ambulatory Visit (HOSPITAL_BASED_OUTPATIENT_CLINIC_OR_DEPARTMENT_OTHER): Payer: Medicare Other | Admitting: Hematology and Oncology

## 2012-11-08 ENCOUNTER — Telehealth: Payer: Self-pay | Admitting: Hematology and Oncology

## 2012-11-08 ENCOUNTER — Encounter: Payer: Self-pay | Admitting: Nutrition

## 2012-11-08 ENCOUNTER — Ambulatory Visit (HOSPITAL_BASED_OUTPATIENT_CLINIC_OR_DEPARTMENT_OTHER): Payer: Medicare Other | Admitting: Lab

## 2012-11-08 ENCOUNTER — Telehealth: Payer: Self-pay | Admitting: Oncology

## 2012-11-08 VITALS — BP 126/66 | HR 77 | Temp 98.0°F | Resp 20 | Ht 68.0 in | Wt 113.1 lb

## 2012-11-08 DIAGNOSIS — C760 Malignant neoplasm of head, face and neck: Secondary | ICD-10-CM

## 2012-11-08 DIAGNOSIS — E46 Unspecified protein-calorie malnutrition: Secondary | ICD-10-CM

## 2012-11-08 DIAGNOSIS — C770 Secondary and unspecified malignant neoplasm of lymph nodes of head, face and neck: Secondary | ICD-10-CM

## 2012-11-08 DIAGNOSIS — C069 Malignant neoplasm of mouth, unspecified: Secondary | ICD-10-CM

## 2012-11-08 DIAGNOSIS — D649 Anemia, unspecified: Secondary | ICD-10-CM

## 2012-11-08 DIAGNOSIS — C50911 Malignant neoplasm of unspecified site of right female breast: Secondary | ICD-10-CM

## 2012-11-08 DIAGNOSIS — C50519 Malignant neoplasm of lower-outer quadrant of unspecified female breast: Secondary | ICD-10-CM

## 2012-11-08 DIAGNOSIS — C030 Malignant neoplasm of upper gum: Secondary | ICD-10-CM

## 2012-11-08 DIAGNOSIS — C7800 Secondary malignant neoplasm of unspecified lung: Secondary | ICD-10-CM

## 2012-11-08 HISTORY — DX: Anemia, unspecified: D64.9

## 2012-11-08 LAB — COMPREHENSIVE METABOLIC PANEL (CC13)
ALT: 9 U/L (ref 0–55)
Alkaline Phosphatase: 74 U/L (ref 40–150)
Anion Gap: 7 mEq/L (ref 3–11)
CO2: 26 mEq/L (ref 22–29)
Calcium: 9.7 mg/dL (ref 8.4–10.4)
Chloride: 105 mEq/L (ref 98–109)
Sodium: 139 mEq/L (ref 136–145)
Total Bilirubin: 0.33 mg/dL (ref 0.20–1.20)
Total Protein: 7.3 g/dL (ref 6.4–8.3)

## 2012-11-08 MED ORDER — PROCHLORPERAZINE MALEATE 10 MG PO TABS: 10.0000 mg | ORAL_TABLET | Freq: Four times a day (QID) | ORAL | Status: DC | PRN

## 2012-11-08 MED ORDER — DEXAMETHASONE 4 MG PO TABS: 8.0000 mg | ORAL_TABLET | Freq: Two times a day (BID) | ORAL | Status: DC

## 2012-11-08 MED ORDER — ONDANSETRON HCL 8 MG PO TABS: 8.0000 mg | ORAL_TABLET | Freq: Three times a day (TID) | ORAL | Status: DC | PRN

## 2012-11-08 NOTE — Progress Notes (Signed)
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 The patient underwent right mastectomy and sentinel lymph node biopsy which showed a T1 CN 0M0 ER/PR positive HER-2/neu negative invasive ductal carcinoma. She was treated with adjuvant endocrine therapy with letrozole. 2) In January 2014, she complained of some jaw pain throat pain and an enlarged cervical lymph node. She saw Dr. Lazarus Salines who did a biopsy of the right maxillary alveolus which came back positive for invasive squamous cell carcinoma. CT scan show possible regional lymph node involvement. She went to [facility] and had surgery.  3) On 03/18/2012 she underwent maxillectomy, right neck dissection, and flap reconstruction at [facility]. Unfortunately she had positive margins as well as regional lymph node involvement. There were presence of lymphovascular invasion and perineural invasion. The size of the tumor was 5 cm in greatest dimension final staging was T4, N1, M0. 4) She returned to [facility] for further evaluation after surgery. The patient has canceled her appointments to See a Medical Oncologist Many Times. Ultimately, she underwent adjuvant radiation therapy only. 5) In September 2014, repeat PET/CT scan showed evidence of metastatic disease to the lungs. She's been referred here for further management.  INTERVAL HISTORY: Kristine Morales 79 y.o. female returns for further followup. She had placement of Infuse-a-Port recently. The patient ran out of her feeding tube supplies. She is still not able to swallow food from her mouth. She complained of some  soreness around the port area. She denies any recent fever, chills, night sweats or abnormal weight loss Denies any new lymphadenopathy.  I have reviewed the past medical history, past surgical history, social history and family history with the patient and they are unchanged from previous note.  ALLERGIES:  has No Known Allergies.  MEDICATIONS:  Current Outpatient Prescriptions  Medication Sig Dispense Refill  . ALPHAGAN P 0.1 % SOLN Place 1 drop into both eyes 2 (two) times daily.       Marland Kitchen aspirin 81 MG tablet Take 81 mg by mouth daily.       . colchicine 0.6 MG tablet Take 0.6 mg by mouth daily.      Marland Kitchen dexamethasone (DECADRON) 4 MG tablet Take 2 tablets (8 mg total) by mouth 2 (two) times daily with a meal. Starting day after chemo for 3 days.  30 tablet  1  . diphenoxylate-atropine (LOMOTIL) 2.5-0.025 MG per tablet Take 1 tablet by mouth 4 (four) times daily as needed for diarrhea or loose stools.      . dorzolamide-timolol (COSOPT) 22.3-6.8 MG/ML ophthalmic solution Place 1 drop into both eyes 2 (two) times daily.       Marland Kitchen HYDROcodone-acetaminophen (NORCO/VICODIN) 5-325 MG per tablet Take 1 tablet by mouth every 6 (six) hours as needed for pain.      Marland Kitchen letrozole (FEMARA) 2.5 MG tablet Take 1 tablet (2.5 mg total) by mouth daily.  30 tablet  11  . lidocaine-prilocaine (EMLA) cream Apply topically as needed. Apply to [facility] a Cath site at least one hour prior to needle stick  30 g  1  . loperamide (IMODIUM) 1 MG/5ML solution Take 10 mLs (2 mg total) by mouth  4 (four) times daily as needed for diarrhea or loose stools.  120 mL  5  . LORazepam (ATIVAN) 0.5 MG tablet Take 1 tablet (0.5 mg total) by mouth every 6 (six) hours as needed (nausea and/or vomiting.).  30 tablet  0  . lovastatin (ALTOPREV) 40 MG 24 hr tablet Take 40 mg by mouth at bedtime.      . mirtazapine (REMERON SOL-TAB) 15 MG disintegrating tablet Take 15 mg by mouth at bedtime.      . nebivolol (BYSTOLIC) 5 MG tablet Take 1 tablet  (5 mg total) by mouth daily.  30 tablet  3  . Nutritional Supplements (FEEDING SUPPLEMENT, VITAL 1.5 CAL,) LIQD Place 237 mLs into feeding tube every 3 (three) hours.      Marland Kitchen nystatin (MYCOSTATIN) 100000 UNIT/ML suspension Take 500,000 Units by mouth 4 (four) times daily.      . ondansetron (ZOFRAN) 8 MG tablet Take 1 tablet (8 mg total) by mouth 2 (two) times daily. Starting day after chemo then twice daily as needed for nausea and/or vomiting.  30 tablet  1  . prochlorperazine (COMPAZINE) 10 MG tablet Take 1 tablet (10 mg total) by mouth every 6 (six) hours as needed (for nausea and/or vomiting).  30 tablet  1  . sodium fluoride (FLUORISHIELD) 1.1 % GEL dental gel Place 1 application onto teeth at bedtime.      Marland Kitchen dexamethasone (DECADRON) 4 MG tablet Take 2 tablets (8 mg total) by mouth 2 (two) times daily with a meal. Take two times a day starting the day after chemotherapy for 3 days.  30 tablet  1  . ondansetron (ZOFRAN) 8 MG tablet Take 1 tablet (8 mg total) by mouth every 8 (eight) hours as needed for nausea.  30 tablet  1  . prochlorperazine (COMPAZINE) 10 MG tablet Take 1 tablet (10 mg total) by mouth every 6 (six) hours as needed (Nausea or vomiting).  30 tablet  1   No current facility-administered medications for this visit.    REVIEW OF SYSTEMS:   Constitutional: Denies fevers, chills or abnormal weight loss Eyes: Denies blurriness of vision Behavioral/Psych: Mood is stable, no new changes  All other systems were reviewed with the patient and are negative.  PHYSICAL EXAMINATION: ECOG PERFORMANCE STATUS: 1 - Symptomatic but completely ambulatory  Filed Vitals:   11/08/12 1334  BP: 126/66  Pulse: 77  Temp: 98 F (36.7 C)  Resp: 20   Filed Weights   11/08/12 1334  Weight: 113 lb 1.6 oz (51.302 kg)    GENERAL:alert, no distress and comfortable. The patient looks thin and cachectic  SKIN: skin color, texture, turgor are normal, no rashes or significant lesions EYES: normal,  Conjunctiva are pink and non-injected, sclera clear OROPHARYNX: significant facial deformity from prior jaw surgery. No evidence of oral thrush  Musculoskeletal:no cyanosis of digits and no clubbing . Infuse-a-Port area looks okay  NEURO: alert & oriented x 3 with fluent speech, no focal motor/sensory deficits  LABORATORY DATA:  I have reviewed the data as listed    Component Value Date/Time   NA 139 11/08/2012 1310   NA 138 09/07/2012 1950   K 4.6 11/08/2012 1310   K 3.6 09/07/2012 1950   CL 103 09/07/2012 1950   CL 104 02/09/2012 0919   CO2 26 11/08/2012 1310   CO2 25 09/07/2012 1950   GLUCOSE 96 11/08/2012 1310   GLUCOSE 99 09/07/2012 1950   GLUCOSE 142* 02/09/2012 0919   BUN 36.5* 11/08/2012  1310   BUN 25* 09/07/2012 1950   CREATININE 1.0 11/08/2012 1310   CREATININE 1.09 09/07/2012 1950   CALCIUM 9.7 11/08/2012 1310   CALCIUM 9.7 09/07/2012 1950   PROT 7.3 11/08/2012 1310   PROT 7.2 09/07/2012 1950   ALBUMIN 3.6 11/08/2012 1310   ALBUMIN 3.6 09/07/2012 1950   AST 16 11/08/2012 1310   AST 17 09/07/2012 1950   ALT 9 11/08/2012 1310   ALT 13 09/07/2012 1950   ALKPHOS 74 11/08/2012 1310   ALKPHOS 75 09/07/2012 1950   BILITOT 0.33 11/08/2012 1310   BILITOT 0.3 09/07/2012 1950   GFRNONAA 47* 09/07/2012 1950   GFRAA 54* 09/07/2012 1950    No results found for this basename: SPEP,  UPEP,   kappa and lambda light chains    Lab Results  Component Value Date   WBC 4.4 11/07/2012   NEUTROABS 3.4 09/07/2012   HGB 8.5* 11/07/2012   HCT 26.4* 11/07/2012   MCV 70.4* 11/07/2012   PLT 252 11/07/2012      Chemistry      Component Value Date/Time   NA 139 11/08/2012 1310   NA 138 09/07/2012 1950   K 4.6 11/08/2012 1310   K 3.6 09/07/2012 1950   CL 103 09/07/2012 1950   CL 104 02/09/2012 0919   CO2 26 11/08/2012 1310   CO2 25 09/07/2012 1950   BUN 36.5* 11/08/2012 1310   BUN 25* 09/07/2012 1950   CREATININE 1.0 11/08/2012 1310   CREATININE 1.09 09/07/2012 1950      Component Value  Date/Time   CALCIUM 9.7 11/08/2012 1310   CALCIUM 9.7 09/07/2012 1950   ALKPHOS 74 11/08/2012 1310   ALKPHOS 75 09/07/2012 1950   AST 16 11/08/2012 1310   AST 17 09/07/2012 1950   ALT 9 11/08/2012 1310   ALT 13 09/07/2012 1950   BILITOT 0.33 11/08/2012 1310   BILITOT 0.3 09/07/2012 1950       RADIOGRAPHIC STUDIES: I have personally reviewed the radiological images as listed and agreed with the findings in the report. Ir Fluoro Guide Cv Line Right  11/07/2012   CLINICAL DATA:  79 year old with metastatic squamous cell carcinoma of the maxilla.  EXAM: FLUOROSCOPIC AND ULTRASOUND GUIDED PLACEMENT OF A SUBCUTANEOUS PORT.  Physician: Rachelle Hora. Lowella Dandy, MD  MEDICATIONS AND MEDICAL HISTORY: Versed 1.5 mg, fentanyl 75 mcg. A radiology nurse monitored the patient for moderate sedation. Ancef 2 g. As antibiotic prophylaxis, Ancef was ordered pre-procedure and administered intravenously within one hour of incision.  ANESTHESIA/SEDATION: Moderate sedation time: 35 minutes  FLUOROSCOPY TIME:  1.0 minutes  PROCEDURE: The risks of the procedure were explained to the patient. Informed consent was obtained. Patient was placed supine on the interventional table. Ultrasound confirmed a patent right internal jugular vein. The right chest and neck were cleaned with a skin antiseptic and a sterile drape was placed. Maximal barrier sterile technique was utilized including caps, mask, sterile gowns, sterile gloves, sterile drape, hand hygiene and skin antiseptic. The right neck was anesthetized with 1% lidocaine. Small incision was made in the right neck with a blade. Micropuncture set was placed in the right internal jugular vein with ultrasound guidance. The micropuncture wire was used for measurement purposes. The right chest was anesthetized with 1% lidocaine with epinephrine. #15 blade was used to make an incision and a subcutaneous port pocket was formed. 8 french Power Port was assembled. Subcutaneous tunnel was formed with  a stiff tunneling device. The port catheter was brought through the  subcutaneous tunnel. The port was placed in the subcutaneous pocket and sutured to the chest wall. The micropuncture set was exchanged for a peel-away sheath. The catheter was placed through the peel-away sheath and the tip was positioned at the junction of the superior vena cava and right atrium. Catheter placement was confirmed with fluoroscopy. The port was accessed and flushed with heparinized saline. The port pocket was closed using two layers of absorbable sutures and Dermabond. The vein skin site was closed using a single layer of absorbable suture and Dermabond. Sterile dressings were applied. Patient tolerated the procedure well without an immediate complication. Ultrasound and fluoroscopic images were taken and saved for this procedure.  COMPLICATIONS: None  IMPRESSION: Placement of a subcutaneous port device. The catheter tip is at the junction of superior vena cava and right atrium and ready to be used.   Electronically Signed   By: Richarda Overlie M.D.   On: 11/07/2012 11:40   Ir US Guide Vasc Access Right  11/07/2012   CLINICAL DATA:  79 year old with metastatic squamous cell carcinoma of the maxilla.  EXAM: FLUOROSCOPIC AND ULTRASOUND GUIDED PLACEMENT OF A SUBCUTANEOUS PORT.  Physician: Rachelle Hora. Lowella Dandy, MD  MEDICATIONS AND MEDICAL HISTORY: Versed 1.5 mg, fentanyl 75 mcg. A radiology nurse monitored the patient for moderate sedation. Ancef 2 g. As antibiotic prophylaxis, Ancef was ordered pre-procedure and administered intravenously within one hour of incision.  ANESTHESIA/SEDATION: Moderate sedation time: 35 minutes  FLUOROSCOPY TIME:  1.0 minutes  PROCEDURE: The risks of the procedure were explained to the patient. Informed consent was obtained. Patient was placed supine on the interventional table. Ultrasound confirmed a patent right internal jugular vein. The right chest and neck were cleaned with a skin antiseptic and a sterile drape  was placed. Maximal barrier sterile technique was utilized including caps, mask, sterile gowns, sterile gloves, sterile drape, hand hygiene and skin antiseptic. The right neck was anesthetized with 1% lidocaine. Small incision was made in the right neck with a blade. Micropuncture set was placed in the right internal jugular vein with ultrasound guidance. The micropuncture wire was used for measurement purposes. The right chest was anesthetized with 1% lidocaine with epinephrine. #15 blade was used to make an incision and a subcutaneous port pocket was formed. 8 french Power Port was assembled. Subcutaneous tunnel was formed with a stiff tunneling device. The port catheter was brought through the subcutaneous tunnel. The port was placed in the subcutaneous pocket and sutured to the chest wall. The micropuncture set was exchanged for a peel-away sheath. The catheter was placed through the peel-away sheath and the tip was positioned at the junction of the superior vena cava and right atrium. Catheter placement was confirmed with fluoroscopy. The port was accessed and flushed with heparinized saline. The port pocket was closed using two layers of absorbable sutures and Dermabond. The vein skin site was closed using a single layer of absorbable suture and Dermabond. Sterile dressings were applied. Patient tolerated the procedure well without an immediate complication. Ultrasound and fluoroscopic images were taken and saved for this procedure.  COMPLICATIONS: None  IMPRESSION: Placement of a subcutaneous port device. The catheter tip is at the junction of superior vena cava and right atrium and ready to be used.   Electronically Signed   By: Richarda Overlie M.D.   On: 11/07/2012 11:40   ASSESSMENT:  #1 recurrent invasive squamous cell carcinoma of the maxilla to the lungs #2 history of breast cancer  PLAN:  #1 recurrent invasive  squamous cell carcinoma of the maxilla to the lungs I discussed with the patient the risk,  benefits, and side effects with chemotherapy. The patient understands that the role of chemotherapy would be palliative only. We discussed some of the risks, benefits and side-effects of  carboplatin and Taxol. Some of the short term side-effects included, though not limited to, risk of fatigue, weight loss, risk of allergic reactions, pancytopenia, life-threatening infections, need for transfusions of blood products, nausea, vomiting, change in bowel habits, hair loss, admission to hospital for various reasons, and risks of death. Long term side-effects are also discussed including permanent damage to nerve function, chronic fatigue, and rare secondary malignancy including bone marrow disorders. The patient is aware that the response rates discussed earlier is not guaranteed.  After a long discussion, patient made an informed decision to proceed with the prescribed plan of care and went ahead to sign the consent form today. We will scheduled chemotherapy to start tomorrow. I will see her on a weekly basis to monitor side effects #2 malnutrition We attempted to call the nutritionist for help in the assistance of getting her nutritional feeding supplies. #3 anemia The patient has severe anemia chronic disease. I will order an additional workup to rule out iron deficiency anemia as well. She denies any recent bleeding. The patient is not symptomatic from anemia. She does require blood transfusions today. #4 history of breast cancer I have discussed this with her prior oncologist. We will not pursue additional screening mammogram in the future due to diagnosis of stage IV invasive squamous cell carcinoma above.   Orders Placed This Encounter  Procedures  . CBC with Differential    Standing Status: Standing     Number of Occurrences: 9     Standing Expiration Date: 11/08/2013  . Comprehensive metabolic panel    Standing Status: Standing     Number of Occurrences: 9     Standing Expiration Date:  11/08/2013   All questions were answered. The patient knows to call the clinic with any problems, questions or concerns. No barriers to learning was detected.   [facility], Elpidio Thielen, MD 11/08/2012 2:27 PM

## 2012-11-08 NOTE — Telephone Encounter (Signed)
gv and printed appt sched and avs for pt for OCT. °

## 2012-11-08 NOTE — Telephone Encounter (Signed)
, °

## 2012-11-08 NOTE — Progress Notes (Signed)
Received message that patient was out of tube feedings.  Patient receives Vital 1.5 through [facility].  I contacted [facility] who noted that they have attempted several times to reach patient by phone and letter and patient has not contacted them back.  Someone from [facility] will call patient at number listed to arrange for tube feeding delivery.  I will schedule a follow up for patient next week when she is here for chemotherapy.

## 2012-11-09 ENCOUNTER — Other Ambulatory Visit: Payer: Self-pay | Admitting: Lab

## 2012-11-09 ENCOUNTER — Ambulatory Visit (HOSPITAL_BASED_OUTPATIENT_CLINIC_OR_DEPARTMENT_OTHER): Payer: Medicare Other

## 2012-11-09 ENCOUNTER — Ambulatory Visit: Payer: Medicare Other

## 2012-11-09 VITALS — BP 143/63 | HR 60 | Temp 98.1°F | Resp 18

## 2012-11-09 DIAGNOSIS — C069 Malignant neoplasm of mouth, unspecified: Secondary | ICD-10-CM

## 2012-11-09 DIAGNOSIS — C50919 Malignant neoplasm of unspecified site of unspecified female breast: Secondary | ICD-10-CM

## 2012-11-09 DIAGNOSIS — C030 Malignant neoplasm of upper gum: Secondary | ICD-10-CM

## 2012-11-09 DIAGNOSIS — Z5111 Encounter for antineoplastic chemotherapy: Secondary | ICD-10-CM

## 2012-11-09 DIAGNOSIS — C760 Malignant neoplasm of head, face and neck: Secondary | ICD-10-CM

## 2012-11-09 MED ORDER — DIPHENHYDRAMINE HCL 50 MG/ML IJ SOLN
50.0000 mg | Freq: Once | INTRAMUSCULAR | Status: AC
  Administered 2012-11-09: 50 mg via INTRAVENOUS

## 2012-11-09 MED ORDER — DEXAMETHASONE SODIUM PHOSPHATE 20 MG/5ML IJ SOLN
20.0000 mg | Freq: Once | INTRAMUSCULAR | Status: AC
  Administered 2012-11-09: 20 mg via INTRAVENOUS

## 2012-11-09 MED ORDER — SODIUM CHLORIDE 0.9 % IV SOLN
Freq: Once | INTRAVENOUS | Status: AC
  Administered 2012-11-09: 13:00:00 via INTRAVENOUS

## 2012-11-09 MED ORDER — FAMOTIDINE IN NACL 20-0.9 MG/50ML-% IV SOLN
INTRAVENOUS | Status: AC
  Filled 2012-11-09: qty 50

## 2012-11-09 MED ORDER — ONDANSETRON 16 MG/50ML IVPB (CHCC)
INTRAVENOUS | Status: AC
  Filled 2012-11-09: qty 16

## 2012-11-09 MED ORDER — SODIUM CHLORIDE 0.9 % IV SOLN
124.2000 mg | Freq: Once | INTRAVENOUS | Status: AC
  Administered 2012-11-09: 120 mg via INTRAVENOUS
  Filled 2012-11-09: qty 12

## 2012-11-09 MED ORDER — SODIUM CHLORIDE 0.9 % IJ SOLN
10.0000 mL | INTRAMUSCULAR | Status: DC | PRN
  Administered 2012-11-09: 10 mL
  Filled 2012-11-09: qty 10

## 2012-11-09 MED ORDER — DIPHENHYDRAMINE HCL 50 MG/ML IJ SOLN
INTRAMUSCULAR | Status: AC
  Filled 2012-11-09: qty 1

## 2012-11-09 MED ORDER — ONDANSETRON 16 MG/50ML IVPB (CHCC)
16.0000 mg | Freq: Once | INTRAVENOUS | Status: AC
  Administered 2012-11-09: 16 mg via INTRAVENOUS

## 2012-11-09 MED ORDER — FAMOTIDINE IN NACL 20-0.9 MG/50ML-% IV SOLN
20.0000 mg | Freq: Once | INTRAVENOUS | Status: AC
  Administered 2012-11-09: 20 mg via INTRAVENOUS

## 2012-11-09 MED ORDER — DEXAMETHASONE SODIUM PHOSPHATE 20 MG/5ML IJ SOLN
INTRAMUSCULAR | Status: AC
  Filled 2012-11-09: qty 5

## 2012-11-09 MED ORDER — HEPARIN SOD (PORK) LOCK FLUSH 100 UNIT/ML IV SOLN
500.0000 [IU] | Freq: Once | INTRAVENOUS | Status: AC | PRN
  Administered 2012-11-09: 500 [IU]
  Filled 2012-11-09: qty 5

## 2012-11-09 MED ORDER — PACLITAXEL CHEMO INJECTION 300 MG/50ML
80.0000 mg/m2 | Freq: Once | INTRAVENOUS | Status: AC
  Administered 2012-11-09: 126 mg via INTRAVENOUS
  Filled 2012-11-09: qty 21

## 2012-11-09 NOTE — Patient Instructions (Signed)
[facility] Discharge Instructions for Patients Receiving Chemotherapy  Today you received the following chemotherapy agents Taxol and Carboplatin.  To help prevent nausea and vomiting after your treatment, we encourage you to take your nausea medication.   If you develop nausea and vomiting that is not controlled by your nausea medication, call the clinic.   BELOW ARE SYMPTOMS THAT SHOULD BE REPORTED IMMEDIATELY:  *FEVER GREATER THAN 100.5 F  *CHILLS WITH OR WITHOUT FEVER  NAUSEA AND VOMITING THAT IS NOT CONTROLLED WITH YOUR NAUSEA MEDICATION  *UNUSUAL SHORTNESS OF BREATH  *UNUSUAL BRUISING OR BLEEDING  TENDERNESS IN MOUTH AND THROAT WITH OR WITHOUT PRESENCE OF ULCERS  *URINARY PROBLEMS  *BOWEL PROBLEMS  UNUSUAL RASH Items with * indicate a potential emergency and should be followed up as soon as possible.  Feel free to call the clinic you have any questions or concerns. The clinic phone number is [phone].   Paclitaxel injection What is this medicine? PACLITAXEL (PAK li TAX el) is a chemotherapy drug. It targets fast dividing cells, like cancer cells, and causes these cells to die. This medicine is used to treat ovarian cancer, breast cancer, and other cancers. This medicine may be used for other purposes; ask your health care provider or pharmacist if you have questions. What should I tell my health care provider before I take this medicine? They need to know if you have any of these conditions: -blood disorders -irregular heartbeat -infection (especially a virus infection such as chickenpox, cold sores, or herpes) -liver disease -previous or ongoing radiation therapy -an unusual or allergic reaction to paclitaxel, alcohol, polyoxyethylated castor oil, other chemotherapy agents, other medicines, foods, dyes, or preservatives -pregnant or trying to get pregnant -breast-feeding How should I use this medicine? This drug is given as an infusion into  a vein. It is administered in a hospital or clinic by a specially trained health care professional. Talk to your pediatrician regarding the use of this medicine in children. Special care may be needed. Overdosage: If you think you have taken too much of this medicine contact a poison control center or emergency room at once. NOTE: This medicine is only for you. Do not share this medicine with others. What if I miss a dose? It is important not to miss your dose. Call your doctor or health care professional if you are unable to keep an appointment. What may interact with this medicine? Do not take this medicine with any of the following medications: -disulfiram -metronidazole This medicine may also interact with the following medications: -cyclosporine -dexamethasone -diazepam -ketoconazole -medicines to increase blood counts like filgrastim, pegfilgrastim, sargramostim -other chemotherapy drugs like cisplatin, doxorubicin, epirubicin, etoposide, teniposide, vincristine -quinidine -testosterone -vaccines -verapamil Talk to your doctor or health care professional before taking any of these medicines: -acetaminophen -aspirin -ibuprofen -ketoprofen -naproxen This list may not describe all possible interactions. Give your health care provider a list of all the medicines, herbs, non-prescription drugs, or dietary supplements you use. Also tell them if you smoke, drink alcohol, or use illegal drugs. Some items may interact with your medicine. What should I watch for while using this medicine? Your condition will be monitored carefully while you are receiving this medicine. You will need important blood work done while you are taking this medicine. This drug may make you feel generally unwell. This is not uncommon, as chemotherapy can affect healthy cells as well as cancer cells. Report any side effects. Continue your course of treatment even though you feel   ill unless your doctor tells you to  stop. In some cases, you may be given additional medicines to help with side effects. Follow all directions for their use. Call your doctor or health care professional for advice if you get a fever, chills or sore throat, or other symptoms of a cold or flu. Do not treat yourself. This drug decreases your body's ability to fight infections. Try to avoid being around people who are sick. This medicine may increase your risk to bruise or bleed. Call your doctor or health care professional if you notice any unusual bleeding. Be careful brushing and flossing your teeth or using a toothpick because you may get an infection or bleed more easily. If you have any dental work done, tell your dentist you are receiving this medicine. Avoid taking products that contain aspirin, acetaminophen, ibuprofen, naproxen, or ketoprofen unless instructed by your doctor. These medicines may hide a fever. Do not become pregnant while taking this medicine. Women should inform their doctor if they wish to become pregnant or think they might be pregnant. There is a potential for serious side effects to an unborn child. Talk to your health care professional or pharmacist for more information. Do not breast-feed an infant while taking this medicine. Men are advised not to father a child while receiving this medicine. What side effects may I notice from receiving this medicine? Side effects that you should report to your doctor or health care professional as soon as possible: -allergic reactions like skin rash, itching or hives, swelling of the face, lips, or tongue -low blood counts - This drug may decrease the number of white blood cells, red blood cells and platelets. You may be at increased risk for infections and bleeding. -signs of infection - fever or chills, cough, sore throat, pain or difficulty passing urine -signs of decreased platelets or bleeding - bruising, pinpoint red spots on the skin, black, tarry stools,  nosebleeds -signs of decreased red blood cells - unusually weak or tired, fainting spells, lightheadedness -breathing problems -chest pain -high or low blood pressure -mouth sores -nausea and vomiting -pain, swelling, redness or irritation at the injection site -pain, tingling, numbness in the hands or feet -slow or irregular heartbeat -swelling of the ankle, feet, hands Side effects that usually do not require medical attention (report to your doctor or health care professional if they continue or are bothersome): -bone pain -complete hair loss including hair on your head, underarms, pubic hair, eyebrows, and eyelashes -changes in the color of fingernails -diarrhea -loosening of the fingernails -loss of appetite -muscle or joint pain -red flush to skin -sweating This list may not describe all possible side effects. Call your doctor for medical advice about side effects. You may report side effects to FDA at 1-800-FDA-1088. Where should I keep my medicine? This drug is given in a hospital or clinic and will not be stored at home. NOTE: This sheet is a summary. It may not cover all possible information. If you have questions about this medicine, talk to your doctor, pharmacist, or health care provider.  2013, [employer]. (12/19/2007 11:54:26 AM)  CARBOPLATIN (KAR boe pla tin) is a chemotherapy drug. It targets fast dividing cells, like cancer cells, and causes these cells to die. This medicine is used to treat ovarian cancer and many other cancers. This medicine may be used for other purposes; ask your health care provider or pharmacist if you have questions. What should I tell my health care provider before I take   this medicine? They need to know if you have any of these conditions: -blood disorders -hearing problems -kidney disease -recent or ongoing radiation therapy -an unusual or allergic reaction to carboplatin, cisplatin, other chemotherapy, other medicines,  foods, dyes, or preservatives -pregnant or trying to get pregnant -breast-feeding How should I use this medicine? This drug is usually given as an infusion into a vein. It is administered in a hospital or clinic by a specially trained health care professional. Talk to your pediatrician regarding the use of this medicine in children. Special care may be needed. Overdosage: If you think you have taken too much of this medicine contact a poison control center or emergency room at once. NOTE: This medicine is only for you. Do not share this medicine with others. What if I miss a dose? It is important not to miss a dose. Call your doctor or health care professional if you are unable to keep an appointment. What may interact with this medicine? -medicines for seizures -medicines to increase blood counts like filgrastim, pegfilgrastim, sargramostim -some antibiotics like amikacin, gentamicin, neomycin, streptomycin, tobramycin -vaccines Talk to your doctor or health care professional before taking any of these medicines: -acetaminophen -aspirin -ibuprofen -ketoprofen -naproxen This list may not describe all possible interactions. Give your health care provider a list of all the medicines, herbs, non-prescription drugs, or dietary supplements you use. Also tell them if you smoke, drink alcohol, or use illegal drugs. Some items may interact with your medicine. What should I watch for while using this medicine? Your condition will be monitored carefully while you are receiving this medicine. You will need important blood work done while you are taking this medicine. This drug may make you feel generally unwell. This is not uncommon, as chemotherapy can affect healthy cells as well as cancer cells. Report any side effects. Continue your course of treatment even though you feel ill unless your doctor tells you to stop. In some cases, you may be given additional medicines to help with side effects.  Follow all directions for their use. Call your doctor or health care professional for advice if you get a fever, chills or sore throat, or other symptoms of a cold or flu. Do not treat yourself. This drug decreases your body's ability to fight infections. Try to avoid being around people who are sick. This medicine may increase your risk to bruise or bleed. Call your doctor or health care professional if you notice any unusual bleeding. Be careful brushing and flossing your teeth or using a toothpick because you may get an infection or bleed more easily. If you have any dental work done, tell your dentist you are receiving this medicine. Avoid taking products that contain aspirin, acetaminophen, ibuprofen, naproxen, or ketoprofen unless instructed by your doctor. These medicines may hide a fever. Do not become pregnant while taking this medicine. Women should inform their doctor if they wish to become pregnant or think they might be pregnant. There is a potential for serious side effects to an unborn child. Talk to your health care professional or pharmacist for more information. Do not breast-feed an infant while taking this medicine. What side effects may I notice from receiving this medicine? Side effects that you should report to your doctor or health care professional as soon as possible: -allergic reactions like skin rash, itching or hives, swelling of the face, lips, or tongue -signs of infection - fever or chills, cough, sore throat, pain or difficulty passing urine -signs of decreased platelets   or bleeding - bruising, pinpoint red spots on the skin, black, tarry stools, nosebleeds -signs of decreased red blood cells - unusually weak or tired, fainting spells, lightheadedness -breathing problems -changes in hearing -changes in vision -chest pain -high blood pressure -low blood counts - This drug may decrease the number of white blood cells, red blood cells and platelets. You may be at  increased risk for infections and bleeding. -nausea and vomiting -pain, swelling, redness or irritation at the injection site -pain, tingling, numbness in the hands or feet -problems with balance, talking, walking -trouble passing urine or change in the amount of urine Side effects that usually do not require medical attention (report to your doctor or health care professional if they continue or are bothersome): -hair loss -loss of appetite -metallic taste in the mouth or changes in taste This list may not describe all possible side effects. Call your doctor for medical advice about side effects. You may report side effects to FDA at 1-800-FDA-1088. Where should I keep my medicine? This drug is given in a hospital or clinic and will not be stored at home. NOTE: This sheet is a summary. It may not cover all possible information. If you have questions about this medicine, talk to your doctor, pharmacist, or health care provider.  2012, [employer]. (04/12/2007 2:38:05 PM) 

## 2012-11-10 ENCOUNTER — Encounter (HOSPITAL_COMMUNITY)
Admission: RE | Admit: 2012-11-10 | Discharge: 2012-11-10 | Disposition: A | Discharge status: Home / Self Care | Payer: Medicare Other | Source: Ambulatory Visit | Attending: Hematology and Oncology | Admitting: Hematology and Oncology

## 2012-11-10 ENCOUNTER — Telehealth: Payer: Self-pay | Admitting: *Deleted

## 2012-11-10 DIAGNOSIS — C7800 Secondary malignant neoplasm of unspecified lung: Secondary | ICD-10-CM | POA: Insufficient documentation

## 2012-11-10 DIAGNOSIS — I7090 Unspecified atherosclerosis: Secondary | ICD-10-CM | POA: Insufficient documentation

## 2012-11-10 DIAGNOSIS — I2510 Atherosclerotic heart disease of native coronary artery without angina pectoris: Secondary | ICD-10-CM | POA: Insufficient documentation

## 2012-11-10 DIAGNOSIS — C760 Malignant neoplasm of head, face and neck: Secondary | ICD-10-CM

## 2012-11-10 DIAGNOSIS — C030 Malignant neoplasm of upper gum: Secondary | ICD-10-CM | POA: Insufficient documentation

## 2012-11-10 LAB — GLUCOSE, CAPILLARY: Glucose-Capillary: 96 mg/dL (ref 70–99)

## 2012-11-10 MED ORDER — FLUDEOXYGLUCOSE F - 18 (FDG) INJECTION
15.6000 | Freq: Once | INTRAVENOUS | Status: AC | PRN
  Administered 2012-11-10: 15.6 via INTRAVENOUS

## 2012-11-10 NOTE — Telephone Encounter (Signed)
Message copied by Augusto Garbe on Thu Nov 10, 2012  4:21 PM ------      Message from: Rexene Edison      Created: Wed Nov 09, 2012  4:17 PM      Regarding: chemo f/u      Contact: [phone]       Dr Bertis Ruddy, 1st time Taxol and Carbo. ------

## 2012-11-10 NOTE — Telephone Encounter (Signed)
Called Kristine Morales for chemotherapy F/U.  Patient is doing well.  Denies n/v.  Denies any new side effects or symptoms.  Bowel and bladder is functioning well.  Eating and drinking well and I instructed to drink 64 oz minimum daily or at least the day before, of and after treatment.  Denies questions at this time and encouraged to call if needed.  Reviewed how to call after hours in the case of an emergency.

## 2012-11-15 ENCOUNTER — Other Ambulatory Visit: Payer: Self-pay | Admitting: *Deleted

## 2012-11-15 ENCOUNTER — Other Ambulatory Visit (HOSPITAL_BASED_OUTPATIENT_CLINIC_OR_DEPARTMENT_OTHER): Payer: Medicare Other | Admitting: Lab

## 2012-11-15 ENCOUNTER — Ambulatory Visit (HOSPITAL_BASED_OUTPATIENT_CLINIC_OR_DEPARTMENT_OTHER): Payer: Medicare Other | Admitting: Hematology and Oncology

## 2012-11-15 ENCOUNTER — Encounter: Payer: Self-pay | Admitting: *Deleted

## 2012-11-15 ENCOUNTER — Encounter: Payer: Self-pay | Admitting: Hematology and Oncology

## 2012-11-15 ENCOUNTER — Other Ambulatory Visit: Payer: Self-pay | Admitting: Hematology and Oncology

## 2012-11-15 ENCOUNTER — Ambulatory Visit (HOSPITAL_COMMUNITY)
Admission: RE | Admit: 2012-11-15 | Discharge: 2012-11-15 | Disposition: A | Discharge status: Home / Self Care | Payer: Medicare Other | Source: Ambulatory Visit | Attending: Hematology and Oncology | Admitting: Hematology and Oncology

## 2012-11-15 VITALS — BP 128/67 | HR 77 | Temp 97.1°F | Resp 20 | Ht 68.0 in | Wt 110.6 lb

## 2012-11-15 DIAGNOSIS — D649 Anemia, unspecified: Secondary | ICD-10-CM

## 2012-11-15 DIAGNOSIS — D630 Anemia in neoplastic disease: Secondary | ICD-10-CM | POA: Insufficient documentation

## 2012-11-15 DIAGNOSIS — C410 Malignant neoplasm of bones of skull and face: Secondary | ICD-10-CM

## 2012-11-15 DIAGNOSIS — C7800 Secondary malignant neoplasm of unspecified lung: Secondary | ICD-10-CM | POA: Insufficient documentation

## 2012-11-15 DIAGNOSIS — C50911 Malignant neoplasm of unspecified site of right female breast: Secondary | ICD-10-CM

## 2012-11-15 DIAGNOSIS — C030 Malignant neoplasm of upper gum: Secondary | ICD-10-CM

## 2012-11-15 DIAGNOSIS — C50919 Malignant neoplasm of unspecified site of unspecified female breast: Secondary | ICD-10-CM

## 2012-11-15 DIAGNOSIS — C760 Malignant neoplasm of head, face and neck: Secondary | ICD-10-CM

## 2012-11-15 DIAGNOSIS — E440 Moderate protein-calorie malnutrition: Secondary | ICD-10-CM | POA: Insufficient documentation

## 2012-11-15 HISTORY — DX: Anemia in neoplastic disease: D63.0

## 2012-11-15 HISTORY — DX: Moderate protein-calorie malnutrition: E44.0

## 2012-11-15 LAB — IRON AND TIBC CHCC
%SAT: 12 % — ABNORMAL LOW (ref 21–57)
Iron: 30 ug/dL — ABNORMAL LOW (ref 41–142)
UIBC: 218 ug/dL (ref 120–384)

## 2012-11-15 LAB — COMPREHENSIVE METABOLIC PANEL (CC13)
AST: 15 U/L (ref 5–34)
Albumin: 3.7 g/dL (ref 3.5–5.0)
Anion Gap: 7 mEq/L (ref 3–11)
BUN: 24.9 mg/dL (ref 7.0–26.0)
CO2: 22 mEq/L (ref 22–29)
Calcium: 9.6 mg/dL (ref 8.4–10.4)
Chloride: 107 mEq/L (ref 98–109)
Glucose: 108 mg/dl (ref 70–140)
Potassium: 4.8 mEq/L (ref 3.5–5.1)
Sodium: 136 mEq/L (ref 136–145)
Total Protein: 7.5 g/dL (ref 6.4–8.3)

## 2012-11-15 LAB — CBC WITH DIFFERENTIAL/PLATELET
Basophils Absolute: 0 10*3/uL (ref 0.0–0.1)
EOS%: 0.9 % (ref 0.0–7.0)
Eosinophils Absolute: 0 10*3/uL (ref 0.0–0.5)
HCT: 25.6 % — ABNORMAL LOW (ref 34.8–46.6)
HGB: 7.9 g/dL — ABNORMAL LOW (ref 11.6–15.9)
MONO#: 0.1 10*3/uL (ref 0.1–0.9)
NEUT#: 2.7 10*3/uL (ref 1.5–6.5)
NEUT%: 74.2 % (ref 38.4–76.8)
RDW: 16.2 % — ABNORMAL HIGH (ref 11.2–14.5)
lymph#: 0.8 10*3/uL — ABNORMAL LOW (ref 0.9–3.3)

## 2012-11-15 LAB — FERRITIN CHCC: Ferritin: 979 ng/ml — ABNORMAL HIGH (ref 9–269)

## 2012-11-15 LAB — ABO/RH: ABO/RH(D): A NEG

## 2012-11-15 NOTE — Progress Notes (Signed)
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 The patient underwent right mastectomy and sentinel lymph node biopsy which showed a T1 CN 0M0 ER/PR positive HER-2/neu negative invasive ductal carcinoma. She was treated with adjuvant endocrine therapy with letrozole. 2) In January 2014, she complained of some jaw pain throat pain and an enlarged cervical lymph node. She saw Dr. Lazarus Salines who did a biopsy of the right maxillary alveolus which came back positive for invasive squamous cell carcinoma. CT scan show possible regional lymph node involvement. She went to [facility] and had surgery.  3) On 03/18/2012 she underwent maxillectomy, right neck dissection, and flap reconstruction at [facility]. Unfortunately she had positive margins as well as regional lymph node involvement. There were presence of lymphovascular invasion and perineural invasion. The size of the tumor was 5 cm in greatest dimension final staging was T4, N1, M0. 4) She returned to [facility] for further evaluation after surgery. The patient has canceled her appointments to See a Medical Oncologist Many Times. Ultimately, she underwent adjuvant radiation therapy only. 5) In September 2014, repeat PET/CT scan showed evidence of metastatic disease to the lungs. She's been referred here for further management. The patient received cycle 1 of chemotherapy last week on 11/09/2012  INTERVAL HISTORY: Kristine Morales 79 y.o. female returns for further followup prior to cycle 2. She denies any infusion reactions. The patient denies any recent signs or  symptoms of bleeding such as spontaneous epistaxis, hematuria or hematochezia. She denies any recent fever, chills, night sweats or abnormal weight loss She complained of very mild fatigue. Denies any peripheral neuropathy.  I have reviewed the past medical history, past surgical history, social history and family history with the patient and they are unchanged from previous note.  ALLERGIES:  has No Known Allergies.  MEDICATIONS:  Current Outpatient Prescriptions  Medication Sig Dispense Refill  . ALPHAGAN P 0.1 % SOLN Place 1 drop into both eyes 2 (two) times daily.       Marland Kitchen HYDROcodone-acetaminophen (NORCO/VICODIN) 5-325 MG per tablet Take 1 tablet by mouth every 6 (six) hours as needed for pain.      Marland Kitchen letrozole (FEMARA) 2.5 MG tablet Take 1 tablet (2.5 mg total) by mouth daily.  30 tablet  11  . lidocaine-prilocaine (EMLA) cream Apply topically as needed. Apply to [facility] a Cath site at least one hour prior to needle stick  30 g  1  . loperamide (IMODIUM) 1 MG/5ML solution Take 10 mLs (2 mg total) by mouth 4 (four) times daily as needed for diarrhea or loose stools.  120 mL  5  . nystatin (MYCOSTATIN) 100000 UNIT/ML suspension Take 500,000 Units by mouth 4 (four) times daily.      . ondansetron (ZOFRAN) 8 MG tablet Take 1 tablet (8 mg total) by mouth 2 (two) times daily. Starting day after chemo then twice daily as needed for nausea and/or vomiting.  30 tablet  1  . ondansetron (ZOFRAN) 8 MG tablet Take 1 tablet (8 mg total) by mouth every 8 (eight) hours as needed for nausea.  30 tablet  1  . prochlorperazine (COMPAZINE) 10 MG tablet  Take 1 tablet (10 mg total) by mouth every 6 (six) hours as needed (for nausea and/or vomiting).  30 tablet  1  . prochlorperazine (COMPAZINE) 10 MG tablet Take 1 tablet (10 mg total) by mouth every 6 (six) hours as needed (Nausea or vomiting).  30 tablet  1  . sodium fluoride (FLUORISHIELD) 1.1 % GEL dental gel Place 1 application onto teeth at bedtime.      .  colchicine 0.6 MG tablet Take 0.6 mg by mouth daily.      Marland Kitchen dexamethasone (DECADRON) 4 MG tablet Take 2 tablets (8 mg total) by mouth 2 (two) times daily with a meal. Starting day after chemo for 3 days.  30 tablet  1  . dexamethasone (DECADRON) 4 MG tablet Take 2 tablets (8 mg total) by mouth 2 (two) times daily with a meal. Take two times a day starting the day after chemotherapy for 3 days.  30 tablet  1  . diphenoxylate-atropine (LOMOTIL) 2.5-0.025 MG per tablet Take 1 tablet by mouth 4 (four) times daily as needed for diarrhea or loose stools.      . dorzolamide-timolol (COSOPT) 22.3-6.8 MG/ML ophthalmic solution Place 1 drop into both eyes 2 (two) times daily.       Marland Kitchen LORazepam (ATIVAN) 0.5 MG tablet Take 1 tablet (0.5 mg total) by mouth every 6 (six) hours as needed (nausea and/or vomiting.).  30 tablet  0  . lovastatin (ALTOPREV) 40 MG 24 hr tablet Take 40 mg by mouth at bedtime.      . mirtazapine (REMERON SOL-TAB) 15 MG disintegrating tablet Take 15 mg by mouth at bedtime.      . nebivolol (BYSTOLIC) 5 MG tablet Take 1 tablet (5 mg total) by mouth daily.  30 tablet  3  . Nutritional Supplements (FEEDING SUPPLEMENT, VITAL 1.5 CAL,) LIQD Place 237 mLs into feeding tube every 3 (three) hours.       No current facility-administered medications for this visit.    REVIEW OF SYSTEMS:   Constitutional: Denies fevers, chills or abnormal weight loss. Due to inability to pay for her supplies, she's not able to get the nutritional feeding she needs and has been drinking ensure several times a day. Eyes: Denies blurriness of vision Ears, nose, mouth, throat, and face: Denies mucositis or sore throat Respiratory: Denies cough, dyspnea or wheezes Cardiovascular: Denies palpitation, chest discomfort or lower extremity swelling Gastrointestinal:  Denies nausea, heartburn or change in bowel habits Skin: Denies abnormal skin rashes Lymphatics: Denies new lymphadenopathy or easy  bruising Neurological:Denies numbness, tingling or new weaknesses Behavioral/Psych: Mood is stable, no new changes  All other systems were reviewed with the patient and are negative.  PHYSICAL EXAMINATION: ECOG PERFORMANCE STATUS: 1 - Symptomatic but completely ambulatory  Filed Vitals:   11/15/12 1147  BP: 128/67  Pulse: 77  Temp: 97.1 F (36.2 C)  Resp: 20   Filed Weights   11/15/12 1147  Weight: 110 lb 9.6 oz (50.168 kg)    GENERAL:alert, no distress and comfortable. The patient looks thin and cachectic SKIN: skin color, texture, turgor are normal, no rashes or significant lesions EYES: normal, Conjunctiva are pale and non-injected, sclera clear OROPHARYNX:no exudate, no erythema and lips, buccal mucosa, and tongue normal  NECK: supple, thyroid normal size, non-tender, without nodularity. Noted deformities due to prior surgeries LYMPH:  no palpable lymphadenopathy in the cervical, axillary or inguinal LUNGS: clear to auscultation and percussion with normal breathing effort HEART: regular rate & rhythm and no murmurs  and no lower extremity edema ABDOMEN:abdomen soft, non-tender and normal bowel sounds Musculoskeletal:no cyanosis of digits and no clubbing  NEURO: alert & oriented x 3 with fluent speech, no focal motor/sensory deficits  LABORATORY DATA:  I have reviewed the data as listed    Component Value Date/Time   NA 139 11/08/2012 1310   NA 138 09/07/2012 1950   K 4.6 11/08/2012 1310   K 3.6 09/07/2012 1950   CL 103 09/07/2012 1950   CL 104 02/09/2012 0919   CO2 26 11/08/2012 1310   CO2 25 09/07/2012 1950   GLUCOSE 96 11/08/2012 1310   GLUCOSE 99 09/07/2012 1950   GLUCOSE 142* 02/09/2012 0919   BUN 36.5* 11/08/2012 1310   BUN 25* 09/07/2012 1950   CREATININE 1.0 11/08/2012 1310   CREATININE 1.09 09/07/2012 1950   CALCIUM 9.7 11/08/2012 1310   CALCIUM 9.7 09/07/2012 1950   PROT 7.3 11/08/2012 1310   PROT 7.2 09/07/2012 1950   ALBUMIN 3.6 11/08/2012 1310   ALBUMIN  3.6 09/07/2012 1950   AST 16 11/08/2012 1310   AST 17 09/07/2012 1950   ALT 9 11/08/2012 1310   ALT 13 09/07/2012 1950   ALKPHOS 74 11/08/2012 1310   ALKPHOS 75 09/07/2012 1950   BILITOT 0.33 11/08/2012 1310   BILITOT 0.3 09/07/2012 1950   GFRNONAA 47* 09/07/2012 1950   GFRAA 54* 09/07/2012 1950    No results found for this basename: SPEP,  UPEP,   kappa and lambda light chains    Lab Results  Component Value Date   WBC 3.7* 11/15/2012   NEUTROABS 2.7 11/15/2012   HGB 7.9* 11/15/2012   HCT 25.6* 11/15/2012   MCV 70.5* 11/15/2012   PLT 246 11/15/2012      Chemistry      Component Value Date/Time   NA 139 11/08/2012 1310   NA 138 09/07/2012 1950   K 4.6 11/08/2012 1310   K 3.6 09/07/2012 1950   CL 103 09/07/2012 1950   CL 104 02/09/2012 0919   CO2 26 11/08/2012 1310   CO2 25 09/07/2012 1950   BUN 36.5* 11/08/2012 1310   BUN 25* 09/07/2012 1950   CREATININE 1.0 11/08/2012 1310   CREATININE 1.09 09/07/2012 1950      Component Value Date/Time   CALCIUM 9.7 11/08/2012 1310   CALCIUM 9.7 09/07/2012 1950   ALKPHOS 74 11/08/2012 1310   ALKPHOS 75 09/07/2012 1950   AST 16 11/08/2012 1310   AST 17 09/07/2012 1950   ALT 9 11/08/2012 1310   ALT 13 09/07/2012 1950   BILITOT 0.33 11/08/2012 1310   BILITOT 0.3 09/07/2012 1950     ASSESSMENT:  #1 invasive squamous cell carcinoma of the left maxilla #2 severe anemia #3 leukopenia  PLAN:  #1 invasive squamous cell carcinoma of the left maxilla the lungs She tolerated chemotherapy well apart from profound pancytopenia. I recommend reducing the dose of her infusion by 20% but keep her infusion cycles to 3 weeks on, one-week off. #2 severe anemia This is likely due to recent treatment. The patient denies recent history of bleeding such as epistaxis, hematuria or hematochezia. She is symptomatic from the anemia with fatigue. I recommend transfusion now. I will continue the chemotherapy with dosage adjustment.  If the anemia gets progressive  worse in the future, I might have to delay her treatment  #3 leukopenia. This is likely due to recent treatment. The patient denies recent history of fevers, cough, chills, diarrhea or dysuria. She is asymptomatic from the  leukopenia. I will observe for now.  I will continue the chemotherapy with slight dosage adjustment.  If the leukopenia gets progressive worse in the future, I might have to delay her treatment or adjust the chemotherapy dose.  We discussed some of the risks, benefits and side-effects blood transfusions. Patient agreed to be transfused and signed consent today. Orders Placed This Encounter  Procedures  . CBC with Differential    Standing Status: Future     Number of Occurrences:      Standing Expiration Date: 08/07/2013  . Comprehensive metabolic panel    Standing Status: Future     Number of Occurrences:      Standing Expiration Date: 11/15/2013  . Hold Tube, Blood Bank    Standing Status: Future     Number of Occurrences:      Standing Expiration Date: 11/15/2013   All questions were answered. The patient knows to call the clinic with any problems, questions or concerns. No barriers to learning was detected.    [facility], Rozanna Cormany, MD 11/15/2012 12:14 PM

## 2012-11-16 ENCOUNTER — Ambulatory Visit: Payer: Medicare Other | Admitting: Nutrition

## 2012-11-16 ENCOUNTER — Encounter: Payer: Self-pay | Admitting: Hematology and Oncology

## 2012-11-16 ENCOUNTER — Ambulatory Visit (HOSPITAL_BASED_OUTPATIENT_CLINIC_OR_DEPARTMENT_OTHER): Payer: Medicare Other

## 2012-11-16 ENCOUNTER — Other Ambulatory Visit: Payer: Self-pay | Admitting: *Deleted

## 2012-11-16 ENCOUNTER — Ambulatory Visit: Payer: Self-pay | Admitting: Oncology

## 2012-11-16 VITALS — BP 133/56 | HR 69 | Temp 98.2°F | Resp 18

## 2012-11-16 DIAGNOSIS — C760 Malignant neoplasm of head, face and neck: Secondary | ICD-10-CM

## 2012-11-16 DIAGNOSIS — C030 Malignant neoplasm of upper gum: Secondary | ICD-10-CM

## 2012-11-16 DIAGNOSIS — Z5111 Encounter for antineoplastic chemotherapy: Secondary | ICD-10-CM

## 2012-11-16 DIAGNOSIS — D649 Anemia, unspecified: Secondary | ICD-10-CM

## 2012-11-16 DIAGNOSIS — C7800 Secondary malignant neoplasm of unspecified lung: Secondary | ICD-10-CM

## 2012-11-16 DIAGNOSIS — C069 Malignant neoplasm of mouth, unspecified: Secondary | ICD-10-CM

## 2012-11-16 LAB — TYPE AND SCREEN: ABO/RH(D): A NEG

## 2012-11-16 LAB — VITAMIN B12: Vitamin B-12: 694 pg/mL (ref 211–911)

## 2012-11-16 LAB — PREPARE RBC (CROSSMATCH)

## 2012-11-16 MED ORDER — DIPHENHYDRAMINE HCL 50 MG/ML IJ SOLN
50.0000 mg | Freq: Once | INTRAMUSCULAR | Status: AC
  Administered 2012-11-16: 50 mg via INTRAVENOUS

## 2012-11-16 MED ORDER — DEXAMETHASONE SODIUM PHOSPHATE 20 MG/5ML IJ SOLN
20.0000 mg | Freq: Once | INTRAMUSCULAR | Status: AC
  Administered 2012-11-16: 20 mg via INTRAVENOUS

## 2012-11-16 MED ORDER — PACLITAXEL CHEMO INJECTION 300 MG/50ML
64.0000 mg/m2 | Freq: Once | INTRAVENOUS | Status: AC
  Administered 2012-11-16: 102 mg via INTRAVENOUS
  Filled 2012-11-16: qty 17

## 2012-11-16 MED ORDER — ONDANSETRON 16 MG/50ML IVPB (CHCC)
INTRAVENOUS | Status: AC
  Filled 2012-11-16: qty 16

## 2012-11-16 MED ORDER — SODIUM CHLORIDE 0.9 % IJ SOLN
10.0000 mL | INTRAMUSCULAR | Status: DC | PRN
  Administered 2012-11-16: 10 mL
  Filled 2012-11-16: qty 10

## 2012-11-16 MED ORDER — DEXAMETHASONE SODIUM PHOSPHATE 20 MG/5ML IJ SOLN
INTRAMUSCULAR | Status: AC
  Filled 2012-11-16: qty 5

## 2012-11-16 MED ORDER — HEPARIN SOD (PORK) LOCK FLUSH 100 UNIT/ML IV SOLN
500.0000 [IU] | Freq: Once | INTRAVENOUS | Status: AC | PRN
  Administered 2012-11-16: 500 [IU]
  Filled 2012-11-16: qty 5

## 2012-11-16 MED ORDER — SODIUM CHLORIDE 0.9 % IV SOLN
Freq: Once | INTRAVENOUS | Status: AC
  Administered 2012-11-16: 13:00:00 via INTRAVENOUS

## 2012-11-16 MED ORDER — DIPHENHYDRAMINE HCL 50 MG/ML IJ SOLN
INTRAMUSCULAR | Status: AC
  Filled 2012-11-16: qty 1

## 2012-11-16 MED ORDER — FAMOTIDINE IN NACL 20-0.9 MG/50ML-% IV SOLN
20.0000 mg | Freq: Once | INTRAVENOUS | Status: AC
  Administered 2012-11-16: 20 mg via INTRAVENOUS

## 2012-11-16 MED ORDER — OSMOLITE 1.2 CAL PO LIQD: ORAL | Status: AC

## 2012-11-16 MED ORDER — ONDANSETRON 16 MG/50ML IVPB (CHCC)
16.0000 mg | Freq: Once | INTRAVENOUS | Status: AC
  Administered 2012-11-16: 16 mg via INTRAVENOUS

## 2012-11-16 MED ORDER — FAMOTIDINE IN NACL 20-0.9 MG/50ML-% IV SOLN
INTRAVENOUS | Status: AC
  Filled 2012-11-16: qty 50

## 2012-11-16 MED ORDER — SODIUM CHLORIDE 0.9 % IV SOLN
99.3600 mg | Freq: Once | INTRAVENOUS | Status: AC
  Administered 2012-11-16: 100 mg via INTRAVENOUS
  Filled 2012-11-16: qty 10

## 2012-11-16 NOTE — Progress Notes (Signed)
Patient reports she did not bring in requested financial information to help obtain tube feedings.  Patient states her diarrhea is greatly improved and she only has had 2 days of increased stools over the past 2 weeks.  She has been using Ensure and Ensure Plus, 3 big bottles, daily. Weight documented as 110.6 pounds October 28.  Patient states that if she could get a new pair of dentures, she could eat foods and not need her tube feeding.  Nutrition Diagnosis:  Food and Nutrition Related Knowledge Deficit continues.  Intervention:  I will change tube feedings to Osmolite 1.2 with a goal of 6 cans daily if no oral intake consumed.  New orders will be written and [facility] notified. I was able to provide patient with a partial case of Osmolite 1.2 today.  I also gave her one complementary case of Ensure Plus for oral intake as tolerated.  Patient able to teach back Tube Feeding instructions.  I have encouraged her to bring in paperwork to see if she qualifies for financial assistance.  Patient is agreeable.  Monitoring, Evaluation, Goals:  Patient will tolerate tube feedings to meet greater than 90% estimated needs to minimize weight loss.  Next Visit: Patient to contact me for questions or concerns.

## 2012-11-16 NOTE — Patient Instructions (Addendum)
[facility] Discharge Instructions for Patients Receiving Chemotherapy  Today you received the following chemotherapy agents Taxol/Carboplatin.  To help prevent nausea and vomiting after your treatment, we encourage you to take your nausea medication as prescribed.   If you develop nausea and vomiting that is not controlled by your nausea medication, call the clinic.   BELOW ARE SYMPTOMS THAT SHOULD BE REPORTED IMMEDIATELY:  *FEVER GREATER THAN 100.5 F  *CHILLS WITH OR WITHOUT FEVER  NAUSEA AND VOMITING THAT IS NOT CONTROLLED WITH YOUR NAUSEA MEDICATION  *UNUSUAL SHORTNESS OF BREATH  *UNUSUAL BRUISING OR BLEEDING  TENDERNESS IN MOUTH AND THROAT WITH OR WITHOUT PRESENCE OF ULCERS  *URINARY PROBLEMS  *BOWEL PROBLEMS  UNUSUAL RASH Items with * indicate a potential emergency and should be followed up as soon as possible.  Feel free to call the clinic you have any questions or concerns. The clinic phone number is [phone].   Blood Transfusion Information WHAT IS A BLOOD TRANSFUSION? A transfusion is the replacement of blood or some of its parts. Blood is made up of multiple cells which provide different functions.  Red blood cells carry oxygen and are used for blood loss replacement.  White blood cells fight against infection.  Platelets control bleeding.  Plasma helps clot blood.  Other blood products are available for specialized needs, such as hemophilia or other clotting disorders. BEFORE THE TRANSFUSION  Who gives blood for transfusions?   You may be able to donate blood to be used at a later date on yourself (autologous donation).  Relatives can be asked to donate blood. This is generally not any safer than if you have received blood from a stranger. The same precautions are taken to ensure safety when a relative's blood is donated.  Healthy volunteers who are fully evaluated to make sure their blood is safe. This is blood bank  blood. Transfusion therapy is the safest it has ever been in the practice of medicine. Before blood is taken from a donor, a complete history is taken to make sure that person has no history of diseases nor engages in risky social behavior (examples are intravenous drug use or sexual activity with multiple partners). The donor's travel history is screened to minimize risk of transmitting infections, such as malaria. The donated blood is tested for signs of infectious diseases, such as HIV and hepatitis. The blood is then tested to be sure it is compatible with you in order to minimize the chance of a transfusion reaction. If you or a relative donates blood, this is often done in anticipation of surgery and is not appropriate for emergency situations. It takes many days to process the donated blood. RISKS AND COMPLICATIONS Although transfusion therapy is very safe and saves many lives, the main dangers of transfusion include:   Getting an infectious disease.  Developing a transfusion reaction. This is an allergic reaction to something in the blood you were given. Every precaution is taken to prevent this. The decision to have a blood transfusion has been considered carefully by your caregiver before blood is given. Blood is not given unless the benefits outweigh the risks. AFTER THE TRANSFUSION  Right after receiving a blood transfusion, you will usually feel much better and more energetic. This is especially true if your red blood cells have gotten low (anemic). The transfusion raises the level of the red blood cells which carry oxygen, and this usually causes an energy increase.  The nurse administering the transfusion will monitor you carefully  for complications. HOME CARE INSTRUCTIONS  No special instructions are needed after a transfusion. You may find your energy is better. Speak with your caregiver about any limitations on activity for underlying diseases you may have. SEEK MEDICAL CARE IF:    Your condition is not improving after your transfusion.  You develop redness or irritation at the intravenous (IV) site. SEEK IMMEDIATE MEDICAL CARE IF:  Any of the following symptoms occur over the next 12 hours:  Shaking chills.  You have a temperature by mouth above 102 F (38.9 C), not controlled by medicine.  Chest, back, or muscle pain.  People around you feel you are not acting correctly or are confused.  Shortness of breath or difficulty breathing.  Dizziness and fainting.  You get a rash or develop hives.  You have a decrease in urine output.  Your urine turns a dark color or changes to pink, red, or brown. Any of the following symptoms occur over the next 10 days:  You have a temperature by mouth above 102 F (38.9 C), not controlled by medicine.  Shortness of breath.  Weakness after normal activity.  The white part of the eye turns yellow (jaundice).  You have a decrease in the amount of urine or are urinating less often.  Your urine turns a dark color or changes to pink, red, or brown. Document Released: 01/03/2000 Document Revised: 03/30/2011 Document Reviewed: 08/22/2007 [facility] Patient Information 2014 [place], [place].

## 2012-11-16 NOTE — Progress Notes (Signed)
Per B Neff, patient is needing feeding tube. She will bring in bank statement for possible [facility] fund 400.00. She has treatment and meet with B Neff this afternoon, so I called her and told her to bring with her today.

## 2012-11-17 LAB — TYPE AND SCREEN
Antibody Screen: NEGATIVE
Unit division: 0

## 2012-11-23 ENCOUNTER — Ambulatory Visit (HOSPITAL_BASED_OUTPATIENT_CLINIC_OR_DEPARTMENT_OTHER): Payer: Medicare Other

## 2012-11-23 ENCOUNTER — Telehealth: Payer: Self-pay | Admitting: *Deleted

## 2012-11-23 ENCOUNTER — Ambulatory Visit (HOSPITAL_BASED_OUTPATIENT_CLINIC_OR_DEPARTMENT_OTHER): Payer: Medicare Other | Admitting: Hematology and Oncology

## 2012-11-23 ENCOUNTER — Ambulatory Visit (HOSPITAL_BASED_OUTPATIENT_CLINIC_OR_DEPARTMENT_OTHER): Payer: Medicare Other | Admitting: Lab

## 2012-11-23 ENCOUNTER — Encounter: Payer: Self-pay | Admitting: Hematology and Oncology

## 2012-11-23 ENCOUNTER — Telehealth: Payer: Self-pay | Admitting: Hematology and Oncology

## 2012-11-23 VITALS — BP 130/76 | HR 89 | Temp 97.6°F | Resp 19 | Ht 68.0 in | Wt 109.4 lb

## 2012-11-23 DIAGNOSIS — C410 Malignant neoplasm of bones of skull and face: Secondary | ICD-10-CM

## 2012-11-23 DIAGNOSIS — E46 Unspecified protein-calorie malnutrition: Secondary | ICD-10-CM

## 2012-11-23 DIAGNOSIS — C069 Malignant neoplasm of mouth, unspecified: Secondary | ICD-10-CM

## 2012-11-23 DIAGNOSIS — C030 Malignant neoplasm of upper gum: Secondary | ICD-10-CM

## 2012-11-23 DIAGNOSIS — D72819 Decreased white blood cell count, unspecified: Secondary | ICD-10-CM

## 2012-11-23 DIAGNOSIS — G4700 Insomnia, unspecified: Secondary | ICD-10-CM | POA: Insufficient documentation

## 2012-11-23 DIAGNOSIS — D630 Anemia in neoplastic disease: Secondary | ICD-10-CM

## 2012-11-23 DIAGNOSIS — D649 Anemia, unspecified: Secondary | ICD-10-CM

## 2012-11-23 DIAGNOSIS — Z5111 Encounter for antineoplastic chemotherapy: Secondary | ICD-10-CM

## 2012-11-23 DIAGNOSIS — C7800 Secondary malignant neoplasm of unspecified lung: Secondary | ICD-10-CM

## 2012-11-23 DIAGNOSIS — C50919 Malignant neoplasm of unspecified site of unspecified female breast: Secondary | ICD-10-CM

## 2012-11-23 HISTORY — DX: Insomnia, unspecified: G47.00

## 2012-11-23 LAB — CBC WITH DIFFERENTIAL/PLATELET
BASO%: 0.6 % (ref 0.0–2.0)
EOS%: 0.6 % (ref 0.0–7.0)
HCT: 29.5 % — ABNORMAL LOW (ref 34.8–46.6)
HGB: 9.3 g/dL — ABNORMAL LOW (ref 11.6–15.9)
LYMPH%: 25.1 % (ref 14.0–49.7)
MCH: 23.5 pg — ABNORMAL LOW (ref 25.1–34.0)
MCHC: 31.5 g/dL (ref 31.5–36.0)
MCV: 74.5 fL — ABNORMAL LOW (ref 79.5–101.0)
MONO%: 8.1 % (ref 0.0–14.0)
NEUT%: 65.6 % (ref 38.4–76.8)
Platelets: 205 10*3/uL (ref 145–400)

## 2012-11-23 LAB — COMPREHENSIVE METABOLIC PANEL (CC13)
AST: 19 U/L (ref 5–34)
Albumin: 3.5 g/dL (ref 3.5–5.0)
Alkaline Phosphatase: 67 U/L (ref 40–150)
Anion Gap: 8 mEq/L (ref 3–11)
Chloride: 105 mEq/L (ref 98–109)
Glucose: 106 mg/dl (ref 70–140)
Potassium: 4.9 mEq/L (ref 3.5–5.1)
Sodium: 136 mEq/L (ref 136–145)
Total Bilirubin: 0.35 mg/dL (ref 0.20–1.20)
Total Protein: 7.5 g/dL (ref 6.4–8.3)

## 2012-11-23 MED ORDER — DIPHENHYDRAMINE HCL 50 MG/ML IJ SOLN
50.0000 mg | Freq: Once | INTRAMUSCULAR | Status: AC
  Administered 2012-11-23: 50 mg via INTRAVENOUS

## 2012-11-23 MED ORDER — SODIUM CHLORIDE 0.9 % IJ SOLN
10.0000 mL | INTRAMUSCULAR | Status: DC | PRN
  Administered 2012-11-23: 10 mL
  Filled 2012-11-23: qty 10

## 2012-11-23 MED ORDER — DIPHENHYDRAMINE HCL 50 MG/ML IJ SOLN
INTRAMUSCULAR | Status: AC
  Filled 2012-11-23: qty 1

## 2012-11-23 MED ORDER — LIDOCAINE-PRILOCAINE 2.5-2.5 % EX CREA
TOPICAL_CREAM | CUTANEOUS | Status: AC
  Filled 2012-11-23: qty 5

## 2012-11-23 MED ORDER — FAMOTIDINE IN NACL 20-0.9 MG/50ML-% IV SOLN
20.0000 mg | Freq: Once | INTRAVENOUS | Status: AC
  Administered 2012-11-23: 20 mg via INTRAVENOUS

## 2012-11-23 MED ORDER — ONDANSETRON 16 MG/50ML IVPB (CHCC)
16.0000 mg | Freq: Once | INTRAVENOUS | Status: AC
  Administered 2012-11-23: 16 mg via INTRAVENOUS

## 2012-11-23 MED ORDER — SODIUM CHLORIDE 0.9 % IV SOLN
Freq: Once | INTRAVENOUS | Status: AC
  Administered 2012-11-23: 13:00:00 via INTRAVENOUS

## 2012-11-23 MED ORDER — SODIUM CHLORIDE 0.9 % IV SOLN
99.3600 mg | Freq: Once | INTRAVENOUS | Status: AC
  Administered 2012-11-23: 100 mg via INTRAVENOUS
  Filled 2012-11-23: qty 10

## 2012-11-23 MED ORDER — FAMOTIDINE IN NACL 20-0.9 MG/50ML-% IV SOLN
INTRAVENOUS | Status: AC
  Filled 2012-11-23: qty 50

## 2012-11-23 MED ORDER — DEXAMETHASONE SODIUM PHOSPHATE 20 MG/5ML IJ SOLN
20.0000 mg | Freq: Once | INTRAMUSCULAR | Status: AC
  Administered 2012-11-23: 20 mg via INTRAVENOUS

## 2012-11-23 MED ORDER — HEPARIN SOD (PORK) LOCK FLUSH 100 UNIT/ML IV SOLN
500.0000 [IU] | Freq: Once | INTRAVENOUS | Status: AC | PRN
  Administered 2012-11-23: 500 [IU]
  Filled 2012-11-23: qty 5

## 2012-11-23 MED ORDER — PACLITAXEL CHEMO INJECTION 300 MG/50ML
64.0000 mg/m2 | Freq: Once | INTRAVENOUS | Status: AC
  Administered 2012-11-23: 102 mg via INTRAVENOUS
  Filled 2012-11-23: qty 17

## 2012-11-23 MED ORDER — DEXAMETHASONE SODIUM PHOSPHATE 20 MG/5ML IJ SOLN
INTRAMUSCULAR | Status: AC
  Filled 2012-11-23: qty 5

## 2012-11-23 MED ORDER — TRAZODONE HCL 50 MG PO TABS: 50.0000 mg | ORAL_TABLET | Freq: Every day | ORAL | Status: DC

## 2012-11-23 MED ORDER — ONDANSETRON 16 MG/50ML IVPB (CHCC)
INTRAVENOUS | Status: AC
  Filled 2012-11-23: qty 16

## 2012-11-23 NOTE — Patient Instructions (Signed)
[facility] Discharge Instructions for Patients Receiving Chemotherapy  Today you received the following chemotherapy agents taxol/carboplatin  To help prevent nausea and vomiting after your treatment, we encourage you to take your nausea medication as directed   If you develop nausea and vomiting that is not controlled by your nausea medication, call the clinic.   BELOW ARE SYMPTOMS THAT SHOULD BE REPORTED IMMEDIATELY:  *FEVER GREATER THAN 100.5 F  *CHILLS WITH OR WITHOUT FEVER  NAUSEA AND VOMITING THAT IS NOT CONTROLLED WITH YOUR NAUSEA MEDICATION  *UNUSUAL SHORTNESS OF BREATH  *UNUSUAL BRUISING OR BLEEDING  TENDERNESS IN MOUTH AND THROAT WITH OR WITHOUT PRESENCE OF ULCERS  *URINARY PROBLEMS  *BOWEL PROBLEMS  UNUSUAL RASH Items with * indicate a potential emergency and should be followed up as soon as possible.  Feel free to call the clinic you have any questions or concerns. The clinic phone number is [phone].  

## 2012-11-23 NOTE — Telephone Encounter (Signed)
GV AND PRINTED APPT SCHED AND AVS FORPT FOR nov ....MW ADDED TX.

## 2012-11-23 NOTE — Progress Notes (Signed)
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 The patient underwent right mastectomy and sentinel lymph node biopsy which showed a T1 CN 0M0 ER/PR positive HER-2/neu negative invasive ductal carcinoma. She was treated with adjuvant endocrine therapy with letrozole. 2) In January 2014, she complained of some jaw pain throat pain and an enlarged cervical lymph node. She saw Dr. Lazarus Salines who did a biopsy of the right maxillary alveolus which came back positive for invasive squamous cell carcinoma. CT scan show possible regional lymph node involvement. She went to [facility] and had surgery.  3) On 03/18/2012 she underwent maxillectomy, right neck dissection, and flap reconstruction at [facility]. Unfortunately she had positive margins as well as regional lymph node involvement. There were presence of lymphovascular invasion and perineural invasion. The size of the tumor was 5 cm in greatest dimension final staging was T4, N1, M0. 4) She returned to [facility] for further evaluation after surgery. The patient has canceled her appointments to See a Medical Oncologist Many Times. Ultimately, she underwent adjuvant radiation therapy only. 5) In September 2014, repeat PET/CT scan showed evidence of metastatic disease to the lungs. She's been referred here for further management. The patient received cycle 1 of chemotherapy last week on 11/09/2012 Her treatment was complicated by severe anemia. She received blood transfusion on 11/19/2012  INTERVAL HISTORY: Kristine Morales 79 y.o. female returns for further followup.  She complained of some soreness in her mouth. Her energy level improved with blood transfusion. She denies any recent fever, chills, night sweats or abnormal weight loss The patient denies any recent signs or symptoms of bleeding such as spontaneous epistaxis, hematuria or hematochezia.  I have reviewed the past medical history, past surgical history, social history and family history with the patient and they are unchanged from previous note.  ALLERGIES:  has No Known Allergies.  MEDICATIONS:  Current Outpatient Prescriptions  Medication Sig Dispense Refill  . ALPHAGAN P 0.1 % SOLN Place 1 drop into both eyes 2 (two) times daily.       Marland Kitchen dexamethasone (DECADRON) 4 MG tablet Take 2 tablets (8 mg total) by mouth 2 (two) times daily with a meal. Starting day after chemo for 3 days.  30 tablet  1  . diphenoxylate-atropine (LOMOTIL) 2.5-0.025 MG per tablet Take 1 tablet by mouth 4 (four) times daily as needed for diarrhea or loose stools.      . lidocaine-prilocaine (EMLA) cream Apply topically as needed. Apply to [facility] a Cath site at least one hour prior to needle stick  30 g  1  . loperamide (IMODIUM) 1 MG/5ML solution Take 10 mLs (2 mg total) by mouth 4 (four) times daily as needed for diarrhea or loose stools.  120 mL  5  . Nutritional Supplements (FEEDING SUPPLEMENT, OSMOLITE 1.2 CAL,) LIQD Begin 360 mL Osmolite 1.2 QID with 60 mL free water before and after each bolus feeding.  1440 mL  0  . nystatin (MYCOSTATIN) 100000 UNIT/ML suspension Take 500,000 Units by mouth 4 (four) times daily.      . sodium fluoride (FLUORISHIELD) 1.1 % GEL dental gel Place 1 application onto teeth  at bedtime.      . colchicine 0.6 MG tablet Take 0.6 mg by mouth daily.      Marland Kitchen dexamethasone (DECADRON) 4 MG tablet Take 2 tablets (8 mg total) by mouth 2 (two) times daily with a meal. Take two times a day starting the day after chemotherapy for 3 days.  30 tablet  1  . dorzolamide-timolol (COSOPT) 22.3-6.8 MG/ML  ophthalmic solution Place 1 drop into both eyes 2 (two) times daily.       Marland Kitchen HYDROcodone-acetaminophen (NORCO/VICODIN) 5-325 MG per tablet Take 1 tablet by mouth every 6 (six) hours as needed for pain.      Marland Kitchen letrozole (FEMARA) 2.5 MG tablet Take 1 tablet (2.5 mg total) by mouth daily.  30 tablet  11  . LORazepam (ATIVAN) 0.5 MG tablet Take 1 tablet (0.5 mg total) by mouth every 6 (six) hours as needed (nausea and/or vomiting.).  30 tablet  0  . lovastatin (ALTOPREV) 40 MG 24 hr tablet Take 40 mg by mouth at bedtime.      . mirtazapine (REMERON SOL-TAB) 15 MG disintegrating tablet Take 15 mg by mouth at bedtime.      . nebivolol (BYSTOLIC) 5 MG tablet Take 1 tablet (5 mg total) by mouth daily.  30 tablet  3  . ondansetron (ZOFRAN) 8 MG tablet Take 1 tablet (8 mg total) by mouth 2 (two) times daily. Starting day after chemo then twice daily as needed for nausea and/or vomiting.  30 tablet  1  . ondansetron (ZOFRAN) 8 MG tablet Take 1 tablet (8 mg total) by mouth every 8 (eight) hours as needed for nausea.  30 tablet  1  . prochlorperazine (COMPAZINE) 10 MG tablet Take 1 tablet (10 mg total) by mouth every 6 (six) hours as needed (for nausea and/or vomiting).  30 tablet  1  . prochlorperazine (COMPAZINE) 10 MG tablet Take 1 tablet (10 mg total) by mouth every 6 (six) hours as needed (Nausea or vomiting).  30 tablet  1  . traZODone (DESYREL) 50 MG tablet Take 1 tablet (50 mg total) by mouth at bedtime.  30 tablet  1   No current facility-administered medications for this visit.    REVIEW OF SYSTEMS:   Constitutional: Denies fevers, chills or abnormal weight loss Eyes: Denies blurriness of vision Respiratory: Denies cough, dyspnea or wheezes Cardiovascular: Denies palpitation, chest discomfort or lower extremity swelling Gastrointestinal:  Denies nausea, heartburn or change in bowel habits Skin: Denies abnormal skin rashes Lymphatics: Denies new lymphadenopathy or easy  bruising Neurological:Denies numbness, tingling or new weaknesses Behavioral/Psych: Mood is stable, no new changes  All other systems were reviewed with the patient and are negative.  PHYSICAL EXAMINATION: ECOG PERFORMANCE STATUS: 1 - Symptomatic but completely ambulatory  Filed Vitals:   11/23/12 1152  BP: 130/76  Pulse: 89  Temp: 97.6 F (36.4 C)  Resp: 19   Filed Weights   11/23/12 1152  Weight: 109 lb 6.4 oz (49.624 kg)    GENERAL:alert, no distress and comfortable. She looks thin and mildly cachectic SKIN: skin color, texture, turgor are normal, no rashes or significant lesions EYES: normal, Conjunctiva are pink and non-injected, sclera clear OROPHARYNX:no exudate, no erythema and lips, buccal mucosa, and tongue normal . There is a small canker sore at the roof of the palate. No evidence of oral thrush NECK: Evidence of tissue fibrosis from radiation treatment, thyroid normal size, non-tender, without nodularity LYMPH:  no palpable lymphadenopathy in the cervical, axillary or inguinal  LUNGS: clear to auscultation and percussion with normal breathing effort HEART: regular rate & rhythm and no murmurs and no lower extremity edema ABDOMEN:abdomen soft, non-tender and normal bowel sounds. Feeding tube site looks okay Musculoskeletal:no cyanosis of digits and no clubbing  NEURO: alert & oriented x 3 with fluent speech, no focal motor/sensory deficits  LABORATORY DATA:  I have reviewed the data as listed    Component Value Date/Time   NA 136 11/15/2012 1123   NA 138 09/07/2012 1950   K 4.8 11/15/2012 1123   K 3.6 09/07/2012 1950   CL 103 09/07/2012 1950   CL 104 02/09/2012 0919   CO2 22 11/15/2012 1123   CO2 25 09/07/2012 1950   GLUCOSE 108 11/15/2012 1123   GLUCOSE 99 09/07/2012 1950   GLUCOSE 142* 02/09/2012 0919   BUN 24.9 11/15/2012 1123   BUN 25* 09/07/2012 1950   CREATININE 0.9 11/15/2012 1123   CREATININE 1.09 09/07/2012 1950   CALCIUM 9.6 11/15/2012 1123   CALCIUM  9.7 09/07/2012 1950   PROT 7.5 11/15/2012 1123   PROT 7.2 09/07/2012 1950   ALBUMIN 3.7 11/15/2012 1123   ALBUMIN 3.6 09/07/2012 1950   AST 15 11/15/2012 1123   AST 17 09/07/2012 1950   ALT 12 11/15/2012 1123   ALT 13 09/07/2012 1950   ALKPHOS 74 11/15/2012 1123   ALKPHOS 75 09/07/2012 1950   BILITOT 0.35 11/15/2012 1123   BILITOT 0.3 09/07/2012 1950   GFRNONAA 47* 09/07/2012 1950   GFRAA 54* 09/07/2012 1950    No results found for this basename: SPEP,  UPEP,   kappa and lambda light chains    Lab Results  Component Value Date   WBC 3.6* 11/23/2012   NEUTROABS 2.4 11/23/2012   HGB 9.3* 11/23/2012   HCT 29.5* 11/23/2012   MCV 74.5* 11/23/2012   PLT 205 11/23/2012      Chemistry      Component Value Date/Time   NA 136 11/15/2012 1123   NA 138 09/07/2012 1950   K 4.8 11/15/2012 1123   K 3.6 09/07/2012 1950   CL 103 09/07/2012 1950   CL 104 02/09/2012 0919   CO2 22 11/15/2012 1123   CO2 25 09/07/2012 1950   BUN 24.9 11/15/2012 1123   BUN 25* 09/07/2012 1950   CREATININE 0.9 11/15/2012 1123   CREATININE 1.09 09/07/2012 1950      Component Value Date/Time   CALCIUM 9.6 11/15/2012 1123   CALCIUM 9.7 09/07/2012 1950   ALKPHOS 74 11/15/2012 1123   ALKPHOS 75 09/07/2012 1950   AST 15 11/15/2012 1123   AST 17 09/07/2012 1950   ALT 12 11/15/2012 1123   ALT 13 09/07/2012 1950   BILITOT 0.35 11/15/2012 1123   BILITOT 0.3 09/07/2012 1950     ASSESSMENT:  Metastatic squamous cell carcinoma  PLAN:  #1 invasive squamous cell carcinoma of the left maxilla the lungs She tolerated chemotherapy well apart from profound pancytopenia. Recently, I reduced the dose of chemotherapy by 20%, and I intend to keep her infusion cycles to 3 weeks on, one-week off. She received her third week chemotherapy today and then get 1 week off. #2 severe anemia This is likely due to recent treatment. The patient denies recent history of bleeding such as epistaxis, hematuria or hematochezia. She is symptomatic from the  anemia with fatigue. She received blood transfusion recently and a hemoglobin has improved. I will continue the chemotherapy without further dosage adjustment.  If the anemia gets progressive worse in the  future, I might have to delay her treatment  #3 leukopenia. This is likely due to recent treatment. The patient denies recent history of fevers, cough, chills, diarrhea or dysuria. She is asymptomatic from the leukopenia. I will observe for now.  I will continue the chemotherapy with slight dosage adjustment as before.  If the leukopenia gets progressive worse in the future, I might have to delay her treatment or adjust the chemotherapy dose.  #4 malnutrition She will continue nutritional feeding #5 canker sore This is related to stress. I recommend conservative management #6 insomnia I recommend a trial of trazodone.  Orders Placed This Encounter  Procedures  . CBC with Differential    Standing Status: Future     Number of Occurrences:      Standing Expiration Date: 08/15/2013  . Comprehensive metabolic panel    Standing Status: Future     Number of Occurrences:      Standing Expiration Date: 11/23/2013   All questions were answered. The patient knows to call the clinic with any problems, questions or concerns. No barriers to learning was detected.      Emilina Smarr, MD 11/23/2012 12:03 PM

## 2012-11-23 NOTE — Telephone Encounter (Signed)
Per staff message and POF I have scheduled appts.  JMW  

## 2012-11-25 ENCOUNTER — Encounter: Payer: Self-pay | Admitting: Hematology and Oncology

## 2012-11-25 NOTE — Progress Notes (Signed)
Patient was suppose to bring in bank statement for asst with feeding tube. I never recd from the patient.

## 2012-12-07 ENCOUNTER — Other Ambulatory Visit (HOSPITAL_BASED_OUTPATIENT_CLINIC_OR_DEPARTMENT_OTHER): Payer: Medicare Other | Admitting: Lab

## 2012-12-07 ENCOUNTER — Telehealth: Payer: Self-pay | Admitting: Hematology and Oncology

## 2012-12-07 ENCOUNTER — Telehealth: Payer: Self-pay | Admitting: *Deleted

## 2012-12-07 ENCOUNTER — Ambulatory Visit: Payer: Medicare Other | Admitting: Nutrition

## 2012-12-07 ENCOUNTER — Encounter (INDEPENDENT_AMBULATORY_CARE_PROVIDER_SITE_OTHER): Payer: Self-pay

## 2012-12-07 ENCOUNTER — Ambulatory Visit (HOSPITAL_BASED_OUTPATIENT_CLINIC_OR_DEPARTMENT_OTHER): Payer: Medicare Other | Admitting: Hematology and Oncology

## 2012-12-07 ENCOUNTER — Ambulatory Visit (HOSPITAL_BASED_OUTPATIENT_CLINIC_OR_DEPARTMENT_OTHER): Payer: Medicare Other

## 2012-12-07 VITALS — BP 143/71 | HR 95 | Temp 98.0°F | Resp 18 | Ht 68.0 in | Wt 112.8 lb

## 2012-12-07 DIAGNOSIS — C7800 Secondary malignant neoplasm of unspecified lung: Secondary | ICD-10-CM

## 2012-12-07 DIAGNOSIS — C030 Malignant neoplasm of upper gum: Secondary | ICD-10-CM

## 2012-12-07 DIAGNOSIS — C50911 Malignant neoplasm of unspecified site of right female breast: Secondary | ICD-10-CM

## 2012-12-07 DIAGNOSIS — C760 Malignant neoplasm of head, face and neck: Secondary | ICD-10-CM

## 2012-12-07 DIAGNOSIS — C410 Malignant neoplasm of bones of skull and face: Secondary | ICD-10-CM

## 2012-12-07 DIAGNOSIS — Z5111 Encounter for antineoplastic chemotherapy: Secondary | ICD-10-CM

## 2012-12-07 DIAGNOSIS — C50919 Malignant neoplasm of unspecified site of unspecified female breast: Secondary | ICD-10-CM

## 2012-12-07 DIAGNOSIS — D649 Anemia, unspecified: Secondary | ICD-10-CM

## 2012-12-07 DIAGNOSIS — R220 Localized swelling, mass and lump, head: Secondary | ICD-10-CM

## 2012-12-07 DIAGNOSIS — E46 Unspecified protein-calorie malnutrition: Secondary | ICD-10-CM

## 2012-12-07 DIAGNOSIS — G620 Drug-induced polyneuropathy: Secondary | ICD-10-CM

## 2012-12-07 DIAGNOSIS — C069 Malignant neoplasm of mouth, unspecified: Secondary | ICD-10-CM

## 2012-12-07 LAB — COMPREHENSIVE METABOLIC PANEL (CC13)
Albumin: 3.4 g/dL — ABNORMAL LOW (ref 3.5–5.0)
BUN: 18.7 mg/dL (ref 7.0–26.0)
Calcium: 9.9 mg/dL (ref 8.4–10.4)
Chloride: 105 mEq/L (ref 98–109)
Glucose: 103 mg/dl (ref 70–140)
Potassium: 5.1 mEq/L (ref 3.5–5.1)
Total Protein: 7.2 g/dL (ref 6.4–8.3)

## 2012-12-07 LAB — CBC WITH DIFFERENTIAL/PLATELET
Basophils Absolute: 0 10*3/uL (ref 0.0–0.1)
Eosinophils Absolute: 0 10*3/uL (ref 0.0–0.5)
HCT: 28.7 % — ABNORMAL LOW (ref 34.8–46.6)
HGB: 9 g/dL — ABNORMAL LOW (ref 11.6–15.9)
LYMPH%: 23.2 % (ref 14.0–49.7)
MCHC: 31.4 g/dL — ABNORMAL LOW (ref 31.5–36.0)
MCV: 74.7 fL — ABNORMAL LOW (ref 79.5–101.0)
MONO#: 0.3 10*3/uL (ref 0.1–0.9)
NEUT#: 3.7 10*3/uL (ref 1.5–6.5)
NEUT%: 70 % (ref 38.4–76.8)
WBC: 5.3 10*3/uL (ref 3.9–10.3)
lymph#: 1.2 10*3/uL (ref 0.9–3.3)
nRBC: 0 % (ref 0–0)

## 2012-12-07 MED ORDER — DIPHENHYDRAMINE HCL 50 MG/ML IJ SOLN
50.0000 mg | Freq: Once | INTRAMUSCULAR | Status: AC
  Administered 2012-12-07: 50 mg via INTRAVENOUS

## 2012-12-07 MED ORDER — DEXAMETHASONE SODIUM PHOSPHATE 20 MG/5ML IJ SOLN
INTRAMUSCULAR | Status: AC
  Filled 2012-12-07: qty 5

## 2012-12-07 MED ORDER — SODIUM CHLORIDE 0.9 % IJ SOLN
10.0000 mL | Freq: Once | INTRAMUSCULAR | Status: AC
  Administered 2012-12-07: 10 mL via INTRAVENOUS
  Filled 2012-12-07: qty 10

## 2012-12-07 MED ORDER — PACLITAXEL CHEMO INJECTION 300 MG/50ML
64.0000 mg/m2 | Freq: Once | INTRAVENOUS | Status: AC
  Administered 2012-12-07: 102 mg via INTRAVENOUS
  Filled 2012-12-07: qty 17

## 2012-12-07 MED ORDER — DEXAMETHASONE SODIUM PHOSPHATE 20 MG/5ML IJ SOLN
20.0000 mg | Freq: Once | INTRAMUSCULAR | Status: AC
  Administered 2012-12-07: 20 mg via INTRAVENOUS

## 2012-12-07 MED ORDER — ONDANSETRON 16 MG/50ML IVPB (CHCC)
16.0000 mg | Freq: Once | INTRAVENOUS | Status: AC
  Administered 2012-12-07: 16 mg via INTRAVENOUS

## 2012-12-07 MED ORDER — FAMOTIDINE IN NACL 20-0.9 MG/50ML-% IV SOLN
20.0000 mg | Freq: Once | INTRAVENOUS | Status: AC
  Administered 2012-12-07: 20 mg via INTRAVENOUS

## 2012-12-07 MED ORDER — SODIUM CHLORIDE 0.9 % IV SOLN
99.3600 mg | Freq: Once | INTRAVENOUS | Status: AC
  Administered 2012-12-07: 100 mg via INTRAVENOUS
  Filled 2012-12-07: qty 10

## 2012-12-07 MED ORDER — ONDANSETRON 16 MG/50ML IVPB (CHCC)
INTRAVENOUS | Status: AC
  Filled 2012-12-07: qty 16

## 2012-12-07 MED ORDER — SODIUM CHLORIDE 0.9 % IV SOLN
Freq: Once | INTRAVENOUS | Status: AC
  Administered 2012-12-07: 13:00:00 via INTRAVENOUS

## 2012-12-07 MED ORDER — FAMOTIDINE IN NACL 20-0.9 MG/50ML-% IV SOLN
INTRAVENOUS | Status: AC
  Filled 2012-12-07: qty 50

## 2012-12-07 MED ORDER — DIPHENHYDRAMINE HCL 50 MG/ML IJ SOLN
INTRAMUSCULAR | Status: AC
  Filled 2012-12-07: qty 1

## 2012-12-07 MED ORDER — HEPARIN SOD (PORK) LOCK FLUSH 100 UNIT/ML IV SOLN
500.0000 [IU] | Freq: Once | INTRAVENOUS | Status: AC | PRN
  Administered 2012-12-07: 500 [IU]
  Filled 2012-12-07: qty 5

## 2012-12-07 NOTE — Telephone Encounter (Signed)
gv adn printed appt sched and avs for pt for NOV adn DEC °

## 2012-12-07 NOTE — Patient Instructions (Addendum)
[facility] Discharge Instructions for Patients Receiving Chemotherapy  Today you received the following chemotherapy agents:  Taxol and Carboplatin  To help prevent nausea and vomiting after your treatment, we encourage you to take your nausea medication as ordered per MD.   If you develop nausea and vomiting that is not controlled by your nausea medication, call the clinic.   BELOW ARE SYMPTOMS THAT SHOULD BE REPORTED IMMEDIATELY:  *FEVER GREATER THAN 100.5 F  *CHILLS WITH OR WITHOUT FEVER  NAUSEA AND VOMITING THAT IS NOT CONTROLLED WITH YOUR NAUSEA MEDICATION  *UNUSUAL SHORTNESS OF BREATH  *UNUSUAL BRUISING OR BLEEDING  TENDERNESS IN MOUTH AND THROAT WITH OR WITHOUT PRESENCE OF ULCERS  *URINARY PROBLEMS  *BOWEL PROBLEMS  UNUSUAL RASH Items with * indicate a potential emergency and should be followed up as soon as possible.  Feel free to call the clinic you have any questions or concerns. The clinic phone number is [phone].   PET SCAN TOMORROW-12/08/12 AT 11:30am AT [facility].  NOTHING TO EAT OR DRINK 6 HOURS PRIOR TO SCAN.

## 2012-12-07 NOTE — Telephone Encounter (Signed)
Informed pt of PET scan scheduled for Friday 11/21 at 11:30 am.  Clarified w/ pt we had told her it was tomorrow, but it is actually on Friday.  NPO for 6 hrs prior to exam.  Arrive at [facility] at 11:30 am on this Friday. Pt verbalized understanding.

## 2012-12-07 NOTE — Progress Notes (Signed)
[facility] OFFICE PROGRESS NOTE  Patient Care Team: Kermit Balo, DO as PCP - General (Geriatric Medicine)  DIAGNOSIS: Recurrent metastatic squamous cell carcinoma of the maxilla to the lungs, ongoing palliative chemotherapy  SUMMARY OF ONCOLOGIC HISTORY: 1) She had background history of breast cancer on the right found on diagnostic mammogram in 2011.She is previous patient of Dr.Magrinat. The patient underwent right mastectomy and sentinel lymph node biopsy which showed a T1 CN 0M0 ER/PR positive HER-2/neu negative invasive ductal carcinoma. She was treated with adjuvant endocrine therapy with letrozole. 2) In January 2014, she complained of some jaw pain throat pain and an enlarged cervical lymph node. She saw Dr. Lazarus Salines who did a biopsy of the right maxillary alveolus which came back positive for invasive squamous cell carcinoma. CT scan show possible regional lymph node involvement. She went to [facility] and had surgery.  3) On 03/18/2012 she underwent maxillectomy, right neck dissection, and flap reconstruction at [facility]. Unfortunately she had positive margins as well as regional lymph node involvement. There were presence of lymphovascular invasion and perineural invasion. The size of the tumor was 5 cm in greatest dimension final staging was T4, N1, M0. 4) She returned to [facility] for further evaluation after surgery. The patient has canceled her appointments to See a Medical Oncologist [facility]. Ultimately, she underwent adjuvant radiation therapy only. 5) In September 2014, repeat PET/CT scan showed evidence of metastatic disease to the lungs. She's been referred here for further management. The patient received cycle 1 of chemotherapy last week on 11/09/2012 Her treatment was complicated by severe anemia. She received blood transfusion on 11/19/2012  INTERVAL HISTORY: Kristine Morales 79 y.o. female returns for further followup. She complained of some  soreness in her mouth. She also complained of some swelling around her face and her neck and she is concerned that her cancer might have recurred. She complained of some mild swallowing difficulties. The patient is gaining weight. She is swallowing some food and using her feeding tube. She thought that her feeding tube might be called. She has some very mild peripheral neuropathy but she does not think is worse. The patient denies any recent signs or symptoms of bleeding such as spontaneous epistaxis, hematuria or hematochezia.  I have reviewed the past medical history, past surgical history, social history and family history with the patient and they are unchanged from previous note.  ALLERGIES:  has No Known Allergies.  MEDICATIONS:  Current Outpatient Prescriptions  Medication Sig Dispense Refill  . ALPHAGAN P 0.1 % SOLN Place 1 drop into both eyes 2 (two) times daily.       . colchicine 0.6 MG tablet Take 0.6 mg by mouth daily.      Marland Kitchen dexamethasone (DECADRON) 4 MG tablet Take 2 tablets (8 mg total) by mouth 2 (two) times daily with a meal. Starting day after chemo for 3 days.  30 tablet  1  . dexamethasone (DECADRON) 4 MG tablet Take 2 tablets (8 mg total) by mouth 2 (two) times daily with a meal. Take two times a day starting the day after chemotherapy for 3 days.  30 tablet  1  . diphenoxylate-atropine (LOMOTIL) 2.5-0.025 MG per tablet Take 1 tablet by mouth 4 (four) times daily as needed for diarrhea or loose stools.      . dorzolamide-timolol (COSOPT) 22.3-6.8 MG/ML ophthalmic solution Place 1 drop into both eyes 2 (two) times daily.       Marland Kitchen HYDROcodone-acetaminophen (NORCO/VICODIN)  5-325 MG per tablet Take 1 tablet by mouth every 6 (six) hours as needed for pain.      Marland Kitchen letrozole (FEMARA) 2.5 MG tablet Take 1 tablet (2.5 mg total) by mouth daily.  30 tablet  11  . lidocaine-prilocaine (EMLA) cream Apply topically as needed. Apply to [facility] a Cath site at least one hour prior to needle  stick  30 g  1  . loperamide (IMODIUM) 1 MG/5ML solution Take 10 mLs (2 mg total) by mouth 4 (four) times daily as needed for diarrhea or loose stools.  120 mL  5  . LORazepam (ATIVAN) 0.5 MG tablet Take 1 tablet (0.5 mg total) by mouth every 6 (six) hours as needed (nausea and/or vomiting.).  30 tablet  0  . lovastatin (ALTOPREV) 40 MG 24 hr tablet Take 40 mg by mouth at bedtime.      . mirtazapine (REMERON SOL-TAB) 15 MG disintegrating tablet Take 15 mg by mouth at bedtime.      . nebivolol (BYSTOLIC) 5 MG tablet Take 1 tablet (5 mg total) by mouth daily.  30 tablet  3  . Nutritional Supplements (FEEDING SUPPLEMENT, OSMOLITE 1.2 CAL,) LIQD Begin 360 mL Osmolite 1.2 QID with 60 mL free water before and after each bolus feeding.  1440 mL  0  . nystatin (MYCOSTATIN) 100000 UNIT/ML suspension Take 500,000 Units by mouth 4 (four) times daily.      . ondansetron (ZOFRAN) 8 MG tablet Take 1 tablet (8 mg total) by mouth 2 (two) times daily. Starting day after chemo then twice daily as needed for nausea and/or vomiting.  30 tablet  1  . ondansetron (ZOFRAN) 8 MG tablet Take 1 tablet (8 mg total) by mouth every 8 (eight) hours as needed for nausea.  30 tablet  1  . prochlorperazine (COMPAZINE) 10 MG tablet Take 1 tablet (10 mg total) by mouth every 6 (six) hours as needed (for nausea and/or vomiting).  30 tablet  1  . prochlorperazine (COMPAZINE) 10 MG tablet Take 1 tablet (10 mg total) by mouth every 6 (six) hours as needed (Nausea or vomiting).  30 tablet  1  . sodium fluoride (FLUORISHIELD) 1.1 % GEL dental gel Place 1 application onto teeth at bedtime.      . traZODone (DESYREL) 50 MG tablet Take 1 tablet (50 mg total) by mouth at bedtime.  30 tablet  1   No current facility-administered medications for this visit.    REVIEW OF SYSTEMS:   Constitutional: Denies fevers, chills or abnormal weight loss Eyes: Denies blurriness of vision Respiratory: Denies cough, dyspnea or wheezes Cardiovascular:  Denies palpitation, chest discomfort or lower extremity swelling Gastrointestinal:  Denies nausea, heartburn or change in bowel habits Skin: Denies abnormal skin rashes Lymphatics: Denies new lymphadenopathy or easy bruising Behavioral/Psych: Mood is stable, no new changes  All other systems were reviewed with the patient and are negative.  PHYSICAL EXAMINATION: ECOG PERFORMANCE STATUS: 1 - Symptomatic but completely ambulatory  Filed Vitals:   12/07/12 1124  BP: 143/71  Pulse: 95  Temp: 98 F (36.7 C)  Resp: 18   Filed Weights   12/07/12 1124  Weight: 112 lb 12.8 oz (51.166 kg)    GENERAL:alert, no distress and comfortable. She appears thin and mildly cachectic SKIN: skin color, texture, turgor are normal, no rashes or significant lesions EYES: normal, Conjunctiva are pink and non-injected, sclera clear OROPHARYNX:no exudate, noted some mild swelling in her gum line with associated erythema but no signs of  mucositis or thrush NECK: Significant radiation and surgical changes. Difficult to appreciate any discrete mass LYMPH:  no palpable lymphadenopathy in the cervical, axillary or inguinal LUNGS: clear to auscultation and percussion with normal breathing effort HEART: regular rate & rhythm and no murmurs and no lower extremity edema ABDOMEN:abdomen soft, non-tender and normal bowel sounds. Her feeding tube site looks okay Musculoskeletal:no cyanosis of digits and no clubbing  NEURO: alert & oriented x 3 with fluent speech, no focal motor/sensory deficits  LABORATORY DATA:  I have reviewed the data as listed    Component Value Date/Time   NA 136 11/23/2012 1126   NA 138 09/07/2012 1950   K 4.9 11/23/2012 1126   K 3.6 09/07/2012 1950   CL 103 09/07/2012 1950   CL 104 02/09/2012 0919   CO2 22 11/23/2012 1126   CO2 25 09/07/2012 1950   GLUCOSE 106 11/23/2012 1126   GLUCOSE 99 09/07/2012 1950   GLUCOSE 142* 02/09/2012 0919   BUN 28.8* 11/23/2012 1126   BUN 25* 09/07/2012 1950    CREATININE 0.9 11/23/2012 1126   CREATININE 1.09 09/07/2012 1950   CALCIUM 10.0 11/23/2012 1126   CALCIUM 9.7 09/07/2012 1950   PROT 7.5 11/23/2012 1126   PROT 7.2 09/07/2012 1950   ALBUMIN 3.5 11/23/2012 1126   ALBUMIN 3.6 09/07/2012 1950   AST 19 11/23/2012 1126   AST 17 09/07/2012 1950   ALT 20 11/23/2012 1126   ALT 13 09/07/2012 1950   ALKPHOS 67 11/23/2012 1126   ALKPHOS 75 09/07/2012 1950   BILITOT 0.35 11/23/2012 1126   BILITOT 0.3 09/07/2012 1950   GFRNONAA 47* 09/07/2012 1950   GFRAA 54* 09/07/2012 1950    No results found for this basename: SPEP,  UPEP,   kappa and lambda light chains    Lab Results  Component Value Date   WBC 5.3 12/07/2012   NEUTROABS 3.7 12/07/2012   HGB 9.0* 12/07/2012   HCT 28.7* 12/07/2012   MCV 74.7* 12/07/2012   PLT 307 12/07/2012      Chemistry      Component Value Date/Time   NA 136 11/23/2012 1126   NA 138 09/07/2012 1950   K 4.9 11/23/2012 1126   K 3.6 09/07/2012 1950   CL 103 09/07/2012 1950   CL 104 02/09/2012 0919   CO2 22 11/23/2012 1126   CO2 25 09/07/2012 1950   BUN 28.8* 11/23/2012 1126   BUN 25* 09/07/2012 1950   CREATININE 0.9 11/23/2012 1126   CREATININE 1.09 09/07/2012 1950      Component Value Date/Time   CALCIUM 10.0 11/23/2012 1126   CALCIUM 9.7 09/07/2012 1950   ALKPHOS 67 11/23/2012 1126   ALKPHOS 75 09/07/2012 1950   AST 19 11/23/2012 1126   AST 17 09/07/2012 1950   ALT 20 11/23/2012 1126   ALT 13 09/07/2012 1950   BILITOT 0.35 11/23/2012 1126   BILITOT 0.3 09/07/2012 1950     ASSESSMENT & PLAN:  #1 invasive squamous cell carcinoma of the left maxilla the lungs She tolerated chemotherapy well apart from profound pancytopenia. Recently, I reduced the dose of chemotherapy by 20%, and I intend to keep her infusion cycles to 3 weeks on, one-week off. She appeared to tolerate that well apart from very mild toxicity. Due to new complains above, I recommend ordering a PET CT scan to assess disease response to chemotherapy #2 anemia This is  likely due to recent treatment. The patient denies recent history of bleeding such as epistaxis, hematuria or  hematochezia. She is symptomatic from the anemia with fatigue. She received blood transfusion recently and a hemoglobin has improved. I will continue the chemotherapy without further dosage adjustment.  If the anemia gets progressive worse in the future, I might have to delay her treatment  #3 facial swelling Am wondering whether this could be due to local recurrence in her jaw I recommend ordering a PET CT scan. We'll proceed with chemotherapy today without delay #4 malnutrition She will continue nutritional feeding. Her feeding tube appears to be clogged. 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 It is only mild, grade 1. We will observe.  Orders Placed This Encounter  Procedures  . NM PET Image Restag (PS) Skull Base To Thigh    Standing Status: Future     Number of Occurrences:      Standing Expiration Date: 02/06/2014    Order Specific Question:  Reason for Exam (SYMPTOM  OR DIAGNOSIS REQUIRED)    Answer:  recurrent maxilla cancer, ongoing chemo, c/o neck swelling, r/o local recurrence and assess chemo response    Order Specific Question:  Preferred imaging location?    Answer:  [facility]   All questions were answered. The patient knows to call the clinic with any problems, questions or concerns. No barriers to learning was detected.    Bluford Sedler, MD 12/07/2012 11:40 AM

## 2012-12-07 NOTE — Telephone Encounter (Signed)
gv and printed appt sched and avs for pt for NOV and DEC °

## 2012-12-07 NOTE — Progress Notes (Signed)
Followup completed with patient in chemotherapy.  Patient's weight documented as 112.8 pounds on November 19, which is increased from 109.4 pounds November 5.  Patient states she does have enteral formula available to use through her feeding tube.  However, she does not want to use her feeding tube.  She prefers to consume Ensure Plus by mouth or soups at a thin consistency which is easier to swallow.  Patient reports her feeding tube has been clogged all week.  RN is aware.  Nutrition: Food and nutrition related knowledge deficit continues.  Intervention: Patient educated to increase Ensure Plus to 4 daily along with soft, moist foods in a consistency which is easy for her to swallow.  Patient verbalizes understanding and agreement to plan.  Teach back method used.  Monitoring, evaluation, goals: Patient will tolerate increased oral intake including Ensure Plus 4 times a day to minimize weight loss.  Weight will be monitored and oral intake adjusted as needed.  Next visit: Wednesday, November 26, during chemotherapy.

## 2012-12-09 ENCOUNTER — Ambulatory Visit (HOSPITAL_COMMUNITY)
Admission: RE | Admit: 2012-12-09 | Discharge: 2012-12-09 | Disposition: A | Discharge status: Home / Self Care | Payer: Medicare Other | Source: Ambulatory Visit | Attending: Hematology and Oncology | Admitting: Hematology and Oncology

## 2012-12-09 DIAGNOSIS — C760 Malignant neoplasm of head, face and neck: Secondary | ICD-10-CM | POA: Insufficient documentation

## 2012-12-09 DIAGNOSIS — C7800 Secondary malignant neoplasm of unspecified lung: Secondary | ICD-10-CM | POA: Insufficient documentation

## 2012-12-09 DIAGNOSIS — R918 Other nonspecific abnormal finding of lung field: Secondary | ICD-10-CM | POA: Insufficient documentation

## 2012-12-09 LAB — GLUCOSE, CAPILLARY: Glucose-Capillary: 85 mg/dL (ref 70–99)

## 2012-12-09 MED ORDER — FLUDEOXYGLUCOSE F - 18 (FDG) INJECTION
15.1000 | Freq: Once | INTRAVENOUS | Status: AC | PRN
  Administered 2012-12-09: 15.1 via INTRAVENOUS

## 2012-12-13 ENCOUNTER — Ambulatory Visit (HOSPITAL_BASED_OUTPATIENT_CLINIC_OR_DEPARTMENT_OTHER): Payer: Medicare Other | Admitting: Hematology and Oncology

## 2012-12-13 ENCOUNTER — Telehealth: Payer: Self-pay | Admitting: Hematology and Oncology

## 2012-12-13 ENCOUNTER — Other Ambulatory Visit (HOSPITAL_BASED_OUTPATIENT_CLINIC_OR_DEPARTMENT_OTHER): Payer: Medicare Other | Admitting: Lab

## 2012-12-13 VITALS — BP 131/59 | HR 89 | Temp 98.4°F | Resp 18 | Ht 68.0 in | Wt 111.2 lb

## 2012-12-13 DIAGNOSIS — C50919 Malignant neoplasm of unspecified site of unspecified female breast: Secondary | ICD-10-CM

## 2012-12-13 DIAGNOSIS — G609 Hereditary and idiopathic neuropathy, unspecified: Secondary | ICD-10-CM

## 2012-12-13 DIAGNOSIS — C760 Malignant neoplasm of head, face and neck: Secondary | ICD-10-CM

## 2012-12-13 DIAGNOSIS — C7800 Secondary malignant neoplasm of unspecified lung: Secondary | ICD-10-CM

## 2012-12-13 DIAGNOSIS — D649 Anemia, unspecified: Secondary | ICD-10-CM

## 2012-12-13 DIAGNOSIS — D630 Anemia in neoplastic disease: Secondary | ICD-10-CM

## 2012-12-13 DIAGNOSIS — C030 Malignant neoplasm of upper gum: Secondary | ICD-10-CM

## 2012-12-13 DIAGNOSIS — G501 Atypical facial pain: Secondary | ICD-10-CM

## 2012-12-13 DIAGNOSIS — C50911 Malignant neoplasm of unspecified site of right female breast: Secondary | ICD-10-CM

## 2012-12-13 LAB — CBC WITH DIFFERENTIAL/PLATELET
Basophils Absolute: 0 10*3/uL (ref 0.0–0.1)
EOS%: 0.6 % (ref 0.0–7.0)
HCT: 26.2 % — ABNORMAL LOW (ref 34.8–46.6)
HGB: 8.2 g/dL — ABNORMAL LOW (ref 11.6–15.9)
LYMPH%: 18.4 % (ref 14.0–49.7)
MCHC: 31.2 g/dL — ABNORMAL LOW (ref 31.5–36.0)
MCV: 74.3 fL — ABNORMAL LOW (ref 79.5–101.0)
MONO#: 0.2 10*3/uL (ref 0.1–0.9)
NEUT%: 75.3 % (ref 38.4–76.8)
Platelets: 250 10*3/uL (ref 145–400)
RBC: 3.53 10*6/uL — ABNORMAL LOW (ref 3.70–5.45)
WBC: 4.7 10*3/uL (ref 3.9–10.3)
lymph#: 0.9 10*3/uL (ref 0.9–3.3)

## 2012-12-13 LAB — COMPREHENSIVE METABOLIC PANEL (CC13)
AST: 15 U/L (ref 5–34)
Albumin: 3.5 g/dL (ref 3.5–5.0)
Anion Gap: 8 mEq/L (ref 3–11)
BUN: 24.7 mg/dL (ref 7.0–26.0)
CO2: 25 mEq/L (ref 22–29)
Calcium: 9.4 mg/dL (ref 8.4–10.4)
Chloride: 104 mEq/L (ref 98–109)
Creatinine: 0.9 mg/dL (ref 0.6–1.1)
Glucose: 103 mg/dl (ref 70–140)
Sodium: 137 mEq/L (ref 136–145)
Total Protein: 7.2 g/dL (ref 6.4–8.3)

## 2012-12-13 MED ORDER — OXYCODONE-ACETAMINOPHEN 5-325 MG PO TABS: 1.0000 | ORAL_TABLET | Freq: Three times a day (TID) | ORAL | Status: AC | PRN

## 2012-12-13 NOTE — Progress Notes (Signed)
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 The patient underwent right mastectomy and sentinel lymph node biopsy which showed a T1 CN 0M0 ER/PR positive HER-2/neu negative invasive ductal carcinoma. She was treated with adjuvant endocrine therapy with letrozole. 2) In January 2014, she complained of some jaw pain throat pain and an enlarged cervical lymph node. She saw Dr. Lazarus Salines who did a biopsy of the right maxillary alveolus which came back positive for invasive squamous cell carcinoma. CT scan show possible regional lymph node involvement. She went to [facility] and had surgery.  3) On 03/18/2012 she underwent maxillectomy, right neck dissection, and flap reconstruction at [facility]. Unfortunately she had positive margins as well as regional lymph node involvement. There were presence of lymphovascular invasion and perineural invasion. The size of the tumor was 5 cm in greatest dimension final staging was T4, N1, M0. 4) She returned to [facility] for further evaluation after surgery. The patient has canceled her appointments to See a Medical Oncologist [facility]. Ultimately, she underwent adjuvant radiation therapy only. 5) In September 2014, repeat PET/CT scan showed evidence of metastatic disease to the lungs. She's been referred here for further management. The patient received cycle 1 of chemotherapy last week on 11/09/2012 Her treatment was complicated by severe anemia. She received blood transfusion on 11/19/2012 #6 11/21/2014repeat PET CT scan show positive response to treatment INTERVAL HISTORY: Kristine Morales 79 y.o.  female returns for further followup. She still has some swelling around her face and some pain when she tried to open her jaw. She had some swallowing difficulties. Denies any recent weight loss. She has very mild peripheral neuropathy but they were not worse. The patient denies any recent signs or symptoms of bleeding such as spontaneous epistaxis, hematuria or hematochezia.  I have reviewed the past medical history, past surgical history, social history and family history with the patient and they are unchanged from previous note.  ALLERGIES:  has No Known Allergies.  MEDICATIONS:  Current Outpatient Prescriptions  Medication Sig Dispense Refill  . ALPHAGAN P 0.1 % SOLN Place 1 drop into both eyes 2 (two) times daily.       . colchicine 0.6 MG tablet Take 0.6 mg by mouth daily.      Marland Kitchen dexamethasone (DECADRON) 4 MG tablet Take 2 tablets (8 mg total) by mouth 2 (two) times daily with a meal. Take two times a day starting the day after chemotherapy for 3 days.  30 tablet  1  . diphenoxylate-atropine (LOMOTIL) 2.5-0.025 MG per tablet Take 1 tablet by mouth 4 (four) times daily as needed for diarrhea or loose stools.      . dorzolamide-timolol (COSOPT) 22.3-6.8 MG/ML ophthalmic solution Place 1 drop into both eyes 2 (two) times daily.       Marland Kitchen HYDROcodone-acetaminophen (NORCO/VICODIN) 5-325 MG per tablet Take 1 tablet by mouth every 6 (six) hours as needed for pain.      Marland Kitchen lidocaine-prilocaine (EMLA) cream Apply topically as needed. Apply to [facility] a Cath site at least one hour prior to needle stick  30 g  1  . loperamide (IMODIUM) 1 MG/5ML solution Take 10 mLs (2 mg total)  by mouth 4 (four) times daily as needed for diarrhea or loose stools.  120 mL  5  . LORazepam (ATIVAN) 0.5 MG tablet Take 1 tablet (0.5 mg total) by mouth every 6 (six) hours as needed (nausea and/or vomiting.).  30 tablet  0  . lovastatin (ALTOPREV) 40 MG 24 hr tablet Take 40 mg by mouth at bedtime.      . mirtazapine (REMERON  SOL-TAB) 15 MG disintegrating tablet Take 15 mg by mouth at bedtime.      . nebivolol (BYSTOLIC) 5 MG tablet Take 1 tablet (5 mg total) by mouth daily.  30 tablet  3  . Nutritional Supplements (FEEDING SUPPLEMENT, OSMOLITE 1.2 CAL,) LIQD Begin 360 mL Osmolite 1.2 QID with 60 mL free water before and after each bolus feeding.  1440 mL  0  . nystatin (MYCOSTATIN) 100000 UNIT/ML suspension Take 500,000 Units by mouth 4 (four) times daily.      . ondansetron (ZOFRAN) 8 MG tablet Take 1 tablet (8 mg total) by mouth 2 (two) times daily. Starting day after chemo then twice daily as needed for nausea and/or vomiting.  30 tablet  1  . ondansetron (ZOFRAN) 8 MG tablet Take 1 tablet (8 mg total) by mouth every 8 (eight) hours as needed for nausea.  30 tablet  1  . prochlorperazine (COMPAZINE) 10 MG tablet Take 1 tablet (10 mg total) by mouth every 6 (six) hours as needed (for nausea and/or vomiting).  30 tablet  1  . prochlorperazine (COMPAZINE) 10 MG tablet Take 1 tablet (10 mg total) by mouth every 6 (six) hours as needed (Nausea or vomiting).  30 tablet  1  . sodium fluoride (FLUORISHIELD) 1.1 % GEL dental gel Place 1 application onto teeth at bedtime.      . traZODone (DESYREL) 50 MG tablet Take 1 tablet (50 mg total) by mouth at bedtime.  30 tablet  1  . letrozole (FEMARA) 2.5 MG tablet Take 1 tablet (2.5 mg total) by mouth daily.  30 tablet  11  . oxyCODONE-acetaminophen (PERCOCET/ROXICET) 5-325 MG per tablet Take 1 tablet by mouth every 8 (eight) hours as needed for severe pain.  30 tablet  0   No current facility-administered medications for this visit.    REVIEW OF SYSTEMS:   Constitutional: Denies fevers, chills or abnormal weight loss Eyes: Denies blurriness of vision Respiratory: Denies cough, dyspnea or wheezes Cardiovascular: Denies palpitation, chest discomfort or lower extremity swelling Gastrointestinal:  Denies nausea, heartburn or change in bowel habits Skin: Denies abnormal skin  rashes Lymphatics: Denies new lymphadenopathy or easy bruising  Behavioral/Psych: Mood is stable, no new changes  All other systems were reviewed with the patient and are negative.  PHYSICAL EXAMINATION: ECOG PERFORMANCE STATUS: 1 - Symptomatic but completely ambulatory  Filed Vitals:   12/13/12 1359  BP: 131/59  Pulse: 89  Temp: 98.4 F (36.9 C)  Resp: 18   Filed Weights   12/13/12 1359  Weight: 111 lb 3.2 oz (50.44 kg)    GENERAL:alert, no distress and comfortable. She appears thin and mildly cachectic SKIN: skin color, texture, turgor are normal, no rashes or significant lesions EYES: normal, Conjunctiva are pink and non-injected, sclera clear OROPHARYNX:no exudate, no erythema and lips, buccal mucosa, and tongue normal . Well-healed surgical scar. She has limited mobility of the jaw NECK: supple, thyroid normal size, non-tender, without nodularity LYMPH:  no palpable lymphadenopathy in the cervical, axillary or inguinal NEURO: alert & oriented x 3 with fluent speech, no  focal motor/sensory deficits  LABORATORY DATA:  I have reviewed the data as listed    Component Value Date/Time   NA 137 12/13/2012 1332   NA 138 09/07/2012 1950   K 5.2* 12/13/2012 1332   K 3.6 09/07/2012 1950   CL 103 09/07/2012 1950   CL 104 02/09/2012 0919   CO2 25 12/13/2012 1332   CO2 25 09/07/2012 1950   GLUCOSE 103 12/13/2012 1332   GLUCOSE 99 09/07/2012 1950   GLUCOSE 142* 02/09/2012 0919   BUN 24.7 12/13/2012 1332   BUN 25* 09/07/2012 1950   CREATININE 0.9 12/13/2012 1332   CREATININE 1.09 09/07/2012 1950   CALCIUM 9.4 12/13/2012 1332   CALCIUM 9.7 09/07/2012 1950   PROT 7.2 12/13/2012 1332   PROT 7.2 09/07/2012 1950   ALBUMIN 3.5 12/13/2012 1332   ALBUMIN 3.6 09/07/2012 1950   AST 15 12/13/2012 1332   AST 17 09/07/2012 1950   ALT 17 12/13/2012 1332   ALT 13 09/07/2012 1950   ALKPHOS 69 12/13/2012 1332   ALKPHOS 75 09/07/2012 1950   BILITOT 0.27 12/13/2012 1332   BILITOT 0.3 09/07/2012 1950    GFRNONAA 47* 09/07/2012 1950   GFRAA 54* 09/07/2012 1950    No results found for this basename: SPEP,  UPEP,   kappa and lambda light chains    Lab Results  Component Value Date   WBC 4.7 12/13/2012   NEUTROABS 3.6 12/13/2012   HGB 8.2* 12/13/2012   HCT 26.2* 12/13/2012   MCV 74.3* 12/13/2012   PLT 250 12/13/2012      Chemistry      Component Value Date/Time   NA 137 12/13/2012 1332   NA 138 09/07/2012 1950   K 5.2* 12/13/2012 1332   K 3.6 09/07/2012 1950   CL 103 09/07/2012 1950   CL 104 02/09/2012 0919   CO2 25 12/13/2012 1332   CO2 25 09/07/2012 1950   BUN 24.7 12/13/2012 1332   BUN 25* 09/07/2012 1950   CREATININE 0.9 12/13/2012 1332   CREATININE 1.09 09/07/2012 1950      Component Value Date/Time   CALCIUM 9.4 12/13/2012 1332   CALCIUM 9.7 09/07/2012 1950   ALKPHOS 69 12/13/2012 1332   ALKPHOS 75 09/07/2012 1950   AST 15 12/13/2012 1332   AST 17 09/07/2012 1950   ALT 17 12/13/2012 1332   ALT 13 09/07/2012 1950   BILITOT 0.27 12/13/2012 1332   BILITOT 0.3 09/07/2012 1950       RADIOGRAPHIC STUDIES: I have personally reviewed the radiological images as listed and agreed with the findings in the report. I reviewed her PET CT scan which show positive response to treatment   ASSESSMENT & PLAN:  #1 invasive squamous cell carcinoma of the left maxilla the lungs She tolerated chemotherapy well apart from profound pancytopenia. Recently, I reduced the dose of chemotherapy by 20%, and I intend to keep her infusion cycles to 3 weeks on, one-week off. She appeared to tolerate that well apart from very mild toxicity. Fortunately, repeat PET CT scan showed positive response to chemotherapy #2 anemia This is likely due to recent treatment. The patient denies recent history of bleeding such as epistaxis, hematuria or hematochezia. She is symptomatic from the anemia with fatigue. She received blood transfusion recently and a hemoglobin has improved. I will continue the chemotherapy  without further dosage adjustment.  If the anemia gets progressive worse in the future, I might have to delay her treatment  #3 facial pain PET CT scan show  no evidence of new disease. I recommend a trial of small amount of pain medication. She agreed. I wonder about side effects of nausea, constipation and sedation with pain medicine.  #4 malnutrition She will continue nutritional feeding.  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 It is only mild, grade 1. We will observe.   Orders Placed This Encounter  Procedures  . Hold Tube, Blood Bank    Standing Status: Future     Number of Occurrences:      Standing Expiration Date: 12/13/2013   All questions were answered. The patient knows to call the clinic with any problems, questions or concerns. No barriers to learning was detected.    [facility], Jazzmyn Filion, MD 12/13/2012 2:28 PM

## 2012-12-13 NOTE — Telephone Encounter (Signed)
worked 11/25 POF appts made AVS and CAL given shh

## 2012-12-14 ENCOUNTER — Ambulatory Visit: Payer: Medicare Other | Admitting: Nutrition

## 2012-12-14 ENCOUNTER — Ambulatory Visit (HOSPITAL_BASED_OUTPATIENT_CLINIC_OR_DEPARTMENT_OTHER): Payer: Medicare Other

## 2012-12-14 ENCOUNTER — Other Ambulatory Visit: Payer: Self-pay | Admitting: *Deleted

## 2012-12-14 ENCOUNTER — Encounter (INDEPENDENT_AMBULATORY_CARE_PROVIDER_SITE_OTHER): Payer: Self-pay

## 2012-12-14 VITALS — BP 112/54 | HR 82 | Temp 98.1°F | Resp 17

## 2012-12-14 DIAGNOSIS — C069 Malignant neoplasm of mouth, unspecified: Secondary | ICD-10-CM

## 2012-12-14 DIAGNOSIS — Z5111 Encounter for antineoplastic chemotherapy: Secondary | ICD-10-CM

## 2012-12-14 DIAGNOSIS — C030 Malignant neoplasm of upper gum: Secondary | ICD-10-CM

## 2012-12-14 MED ORDER — DEXAMETHASONE SODIUM PHOSPHATE 20 MG/5ML IJ SOLN
INTRAMUSCULAR | Status: AC
  Filled 2012-12-14: qty 5

## 2012-12-14 MED ORDER — SODIUM CHLORIDE 0.9 % IV SOLN
Freq: Once | INTRAVENOUS | Status: AC
  Administered 2012-12-14: 14:00:00 via INTRAVENOUS

## 2012-12-14 MED ORDER — DIPHENHYDRAMINE HCL 50 MG/ML IJ SOLN
50.0000 mg | Freq: Once | INTRAMUSCULAR | Status: AC
  Administered 2012-12-14: 50 mg via INTRAVENOUS

## 2012-12-14 MED ORDER — HEPARIN SOD (PORK) LOCK FLUSH 100 UNIT/ML IV SOLN
500.0000 [IU] | Freq: Once | INTRAVENOUS | Status: AC | PRN
  Administered 2012-12-14: 500 [IU]
  Filled 2012-12-14: qty 5

## 2012-12-14 MED ORDER — FAMOTIDINE IN NACL 20-0.9 MG/50ML-% IV SOLN
INTRAVENOUS | Status: AC
  Filled 2012-12-14: qty 50

## 2012-12-14 MED ORDER — ONDANSETRON 16 MG/50ML IVPB (CHCC)
16.0000 mg | Freq: Once | INTRAVENOUS | Status: AC
  Administered 2012-12-14: 16 mg via INTRAVENOUS

## 2012-12-14 MED ORDER — ONDANSETRON 16 MG/50ML IVPB (CHCC)
INTRAVENOUS | Status: AC
  Filled 2012-12-14: qty 16

## 2012-12-14 MED ORDER — SODIUM CHLORIDE 0.9 % IJ SOLN
10.0000 mL | INTRAMUSCULAR | Status: DC | PRN
  Administered 2012-12-14: 10 mL
  Filled 2012-12-14: qty 10

## 2012-12-14 MED ORDER — SODIUM CHLORIDE 0.9 % IV SOLN
99.3600 mg | Freq: Once | INTRAVENOUS | Status: AC
  Administered 2012-12-14: 100 mg via INTRAVENOUS
  Filled 2012-12-14: qty 10

## 2012-12-14 MED ORDER — PACLITAXEL CHEMO INJECTION 300 MG/50ML
64.0000 mg/m2 | Freq: Once | INTRAVENOUS | Status: AC
  Administered 2012-12-14: 102 mg via INTRAVENOUS
  Filled 2012-12-14: qty 17

## 2012-12-14 MED ORDER — DIPHENHYDRAMINE HCL 50 MG/ML IJ SOLN
INTRAMUSCULAR | Status: AC
  Filled 2012-12-14: qty 1

## 2012-12-14 MED ORDER — DEXAMETHASONE SODIUM PHOSPHATE 20 MG/5ML IJ SOLN
20.0000 mg | Freq: Once | INTRAMUSCULAR | Status: AC
  Administered 2012-12-14: 20 mg via INTRAVENOUS

## 2012-12-14 MED ORDER — FAMOTIDINE IN NACL 20-0.9 MG/50ML-% IV SOLN
20.0000 mg | Freq: Once | INTRAVENOUS | Status: AC
  Administered 2012-12-14: 20 mg via INTRAVENOUS

## 2012-12-14 NOTE — Progress Notes (Signed)
Patient's weight is stable and was documented as 111.2 pounds.  Patient is pleased with her positive response to chemotherapy.  She continues to drink Ensure Plus or Osmolite 1.5 as desired.  She uses her feeding tube inconsistently.  She has no questions or concerns.  Patient has been noncompliant with regular tube feedings.  Nutrition diagnosis: Food and nutrition related knowledge deficit has improved.  Intervention: Patient again educated to increase oral intake including Ensure Plus to provide adequate calories and protein to promote weight gain.  Monitoring, evaluation, goals: Patient will increase oral intake to minimize further weight loss.  Next visit: Wednesday, December 3, during chemotherapy.

## 2012-12-14 NOTE — Patient Instructions (Signed)
[facility] Discharge Instructions for Patients Receiving Chemotherapy  Today you received the following chemotherapy agents Taxol/Carboplatin To help prevent nausea and vomiting after your treatment, we encourage you to take your nausea medication as prescribed.  If you develop nausea and vomiting that is not controlled by your nausea medication, call the clinic.   BELOW ARE SYMPTOMS THAT SHOULD BE REPORTED IMMEDIATELY:  *FEVER GREATER THAN 100.5 F  *CHILLS WITH OR WITHOUT FEVER  NAUSEA AND VOMITING THAT IS NOT CONTROLLED WITH YOUR NAUSEA MEDICATION  *UNUSUAL SHORTNESS OF BREATH  *UNUSUAL BRUISING OR BLEEDING  TENDERNESS IN MOUTH AND THROAT WITH OR WITHOUT PRESENCE OF ULCERS  *URINARY PROBLEMS  *BOWEL PROBLEMS  UNUSUAL RASH Items with * indicate a potential emergency and should be followed up as soon as possible.  Feel free to call the clinic you have any questions or concerns. The clinic phone number is [phone].    

## 2012-12-21 ENCOUNTER — Ambulatory Visit (HOSPITAL_COMMUNITY)
Admission: RE | Admit: 2012-12-21 | Discharge: 2012-12-21 | Disposition: A | Discharge status: Home / Self Care | Payer: Medicare Other | Source: Ambulatory Visit | Attending: Hematology and Oncology | Admitting: Hematology and Oncology

## 2012-12-21 ENCOUNTER — Telehealth: Payer: Self-pay | Admitting: Hematology and Oncology

## 2012-12-21 ENCOUNTER — Ambulatory Visit (HOSPITAL_BASED_OUTPATIENT_CLINIC_OR_DEPARTMENT_OTHER): Payer: Medicare Other

## 2012-12-21 ENCOUNTER — Encounter: Payer: Self-pay | Admitting: Hematology and Oncology

## 2012-12-21 ENCOUNTER — Ambulatory Visit: Payer: Medicare Other | Admitting: Nutrition

## 2012-12-21 ENCOUNTER — Ambulatory Visit (HOSPITAL_BASED_OUTPATIENT_CLINIC_OR_DEPARTMENT_OTHER): Payer: Medicare Other | Admitting: Hematology and Oncology

## 2012-12-21 ENCOUNTER — Other Ambulatory Visit (HOSPITAL_BASED_OUTPATIENT_CLINIC_OR_DEPARTMENT_OTHER): Payer: Medicare Other | Admitting: Lab

## 2012-12-21 VITALS — BP 114/47 | HR 79 | Temp 97.8°F | Resp 18 | Ht 68.0 in | Wt 111.2 lb

## 2012-12-21 VITALS — BP 132/76 | HR 83 | Temp 98.3°F | Resp 20

## 2012-12-21 DIAGNOSIS — C069 Malignant neoplasm of mouth, unspecified: Secondary | ICD-10-CM

## 2012-12-21 DIAGNOSIS — D6481 Anemia due to antineoplastic chemotherapy: Secondary | ICD-10-CM

## 2012-12-21 DIAGNOSIS — C7800 Secondary malignant neoplasm of unspecified lung: Secondary | ICD-10-CM

## 2012-12-21 DIAGNOSIS — C030 Malignant neoplasm of upper gum: Secondary | ICD-10-CM

## 2012-12-21 DIAGNOSIS — Z5111 Encounter for antineoplastic chemotherapy: Secondary | ICD-10-CM

## 2012-12-21 DIAGNOSIS — D649 Anemia, unspecified: Secondary | ICD-10-CM

## 2012-12-21 DIAGNOSIS — C50911 Malignant neoplasm of unspecified site of right female breast: Secondary | ICD-10-CM

## 2012-12-21 DIAGNOSIS — R51 Headache: Secondary | ICD-10-CM

## 2012-12-21 DIAGNOSIS — C410 Malignant neoplasm of bones of skull and face: Secondary | ICD-10-CM

## 2012-12-21 DIAGNOSIS — C760 Malignant neoplasm of head, face and neck: Secondary | ICD-10-CM

## 2012-12-21 DIAGNOSIS — C50919 Malignant neoplasm of unspecified site of unspecified female breast: Secondary | ICD-10-CM

## 2012-12-21 DIAGNOSIS — D630 Anemia in neoplastic disease: Secondary | ICD-10-CM

## 2012-12-21 DIAGNOSIS — T451X5A Adverse effect of antineoplastic and immunosuppressive drugs, initial encounter: Secondary | ICD-10-CM | POA: Insufficient documentation

## 2012-12-21 DIAGNOSIS — G609 Hereditary and idiopathic neuropathy, unspecified: Secondary | ICD-10-CM

## 2012-12-21 DIAGNOSIS — E46 Unspecified protein-calorie malnutrition: Secondary | ICD-10-CM

## 2012-12-21 LAB — COMPREHENSIVE METABOLIC PANEL (CC13)
ALT: 12 U/L (ref 0–55)
Albumin: 3.4 g/dL — ABNORMAL LOW (ref 3.5–5.0)
Anion Gap: 6 mEq/L (ref 3–11)
BUN: 17.8 mg/dL (ref 7.0–26.0)
CO2: 25 mEq/L (ref 22–29)
Calcium: 9.5 mg/dL (ref 8.4–10.4)
Chloride: 105 mEq/L (ref 98–109)
Creatinine: 0.8 mg/dL (ref 0.6–1.1)
Potassium: 4.9 mEq/L (ref 3.5–5.1)

## 2012-12-21 LAB — CBC WITH DIFFERENTIAL/PLATELET
Eosinophils Absolute: 0 10*3/uL (ref 0.0–0.5)
HCT: 24.8 % — ABNORMAL LOW (ref 34.8–46.6)
HGB: 7.8 g/dL — ABNORMAL LOW (ref 11.6–15.9)
LYMPH%: 17.5 % (ref 14.0–49.7)
MCV: 74 fL — ABNORMAL LOW (ref 79.5–101.0)
MONO#: 0.2 10*3/uL (ref 0.1–0.9)
NEUT#: 3.2 10*3/uL (ref 1.5–6.5)
NEUT%: 76.1 % (ref 38.4–76.8)
Platelets: 184 10*3/uL (ref 145–400)
RDW: 18.1 % — ABNORMAL HIGH (ref 11.2–14.5)
WBC: 4.2 10*3/uL (ref 3.9–10.3)

## 2012-12-21 MED ORDER — FAMOTIDINE IN NACL 20-0.9 MG/50ML-% IV SOLN
INTRAVENOUS | Status: AC
  Filled 2012-12-21: qty 50

## 2012-12-21 MED ORDER — SODIUM CHLORIDE 0.9 % IJ SOLN
10.0000 mL | INTRAMUSCULAR | Status: DC | PRN
  Filled 2012-12-21: qty 10

## 2012-12-21 MED ORDER — SODIUM CHLORIDE 0.9 % IJ SOLN
3.0000 mL | INTRAMUSCULAR | Status: DC | PRN
  Filled 2012-12-21: qty 10

## 2012-12-21 MED ORDER — HEPARIN SOD (PORK) LOCK FLUSH 100 UNIT/ML IV SOLN
250.0000 [IU] | INTRAVENOUS | Status: DC | PRN
  Filled 2012-12-21: qty 5

## 2012-12-21 MED ORDER — SODIUM CHLORIDE 0.9 % IV SOLN
99.3600 mg | Freq: Once | INTRAVENOUS | Status: AC
  Administered 2012-12-21: 100 mg via INTRAVENOUS
  Filled 2012-12-21: qty 10

## 2012-12-21 MED ORDER — FAMOTIDINE IN NACL 20-0.9 MG/50ML-% IV SOLN
20.0000 mg | Freq: Once | INTRAVENOUS | Status: AC
  Administered 2012-12-21: 20 mg via INTRAVENOUS

## 2012-12-21 MED ORDER — HEPARIN SOD (PORK) LOCK FLUSH 100 UNIT/ML IV SOLN
500.0000 [IU] | Freq: Once | INTRAVENOUS | Status: DC | PRN
  Filled 2012-12-21: qty 5

## 2012-12-21 MED ORDER — ONDANSETRON 16 MG/50ML IVPB (CHCC)
16.0000 mg | Freq: Once | INTRAVENOUS | Status: AC
  Administered 2012-12-21: 16 mg via INTRAVENOUS

## 2012-12-21 MED ORDER — DEXAMETHASONE SODIUM PHOSPHATE 20 MG/5ML IJ SOLN
20.0000 mg | Freq: Once | INTRAMUSCULAR | Status: AC
  Administered 2012-12-21: 20 mg via INTRAVENOUS

## 2012-12-21 MED ORDER — SODIUM CHLORIDE 0.9 % IV SOLN
250.0000 mL | Freq: Once | INTRAVENOUS | Status: AC
  Administered 2012-12-21: 250 mL via INTRAVENOUS

## 2012-12-21 MED ORDER — ONDANSETRON 16 MG/50ML IVPB (CHCC)
INTRAVENOUS | Status: AC
  Filled 2012-12-21: qty 16

## 2012-12-21 MED ORDER — HEPARIN SOD (PORK) LOCK FLUSH 100 UNIT/ML IV SOLN
500.0000 [IU] | Freq: Every day | INTRAVENOUS | Status: DC | PRN
  Filled 2012-12-21: qty 5

## 2012-12-21 MED ORDER — DEXAMETHASONE SODIUM PHOSPHATE 20 MG/5ML IJ SOLN
INTRAMUSCULAR | Status: AC
  Filled 2012-12-21: qty 5

## 2012-12-21 MED ORDER — DIPHENHYDRAMINE HCL 50 MG/ML IJ SOLN
50.0000 mg | Freq: Once | INTRAMUSCULAR | Status: AC
  Administered 2012-12-21: 50 mg via INTRAVENOUS

## 2012-12-21 MED ORDER — DIPHENHYDRAMINE HCL 50 MG/ML IJ SOLN
INTRAMUSCULAR | Status: AC
  Filled 2012-12-21: qty 1

## 2012-12-21 MED ORDER — SODIUM CHLORIDE 0.9 % IV SOLN
64.0000 mg/m2 | Freq: Once | INTRAVENOUS | Status: AC
  Administered 2012-12-21: 102 mg via INTRAVENOUS
  Filled 2012-12-21: qty 17

## 2012-12-21 NOTE — Telephone Encounter (Signed)
sed added tx.

## 2012-12-21 NOTE — Telephone Encounter (Signed)
gv and printed appt sched and avs for pt for pt... °

## 2012-12-21 NOTE — Progress Notes (Signed)
Nutrition followup completed with patient in chemotherapy.  Patient's weight remained stable and was documented as 111.2 pounds.  She continues to only use her tube for nutrition support when she doesn't feel like eating.  Patient refusing to increase tube feedings at this time.  Nutrition diagnosis: Food and nutrition related knowledge deficit resolved.  I encouraged patient to increase oral intake and/or tube feedings to promote weight gain.  I recommended patient contact me if she has any further nutrition issues.  I will continue to follow patient as needed but not on a regularly scheduled basis.

## 2012-12-21 NOTE — Patient Instructions (Signed)
[facility] Discharge Instructions for Patients Receiving Chemotherapy  Today you received the following chemotherapy agents Taxol/Carbo  To help prevent nausea and vomiting after your treatment, we encourage you to take your nausea medication as directed   If you develop nausea and vomiting that is not controlled by your nausea medication, call the clinic.   BELOW ARE SYMPTOMS THAT SHOULD BE REPORTED IMMEDIATELY:  *FEVER GREATER THAN 100.5 F  *CHILLS WITH OR WITHOUT FEVER  NAUSEA AND VOMITING THAT IS NOT CONTROLLED WITH YOUR NAUSEA MEDICATION  *UNUSUAL SHORTNESS OF BREATH  *UNUSUAL BRUISING OR BLEEDING  TENDERNESS IN MOUTH AND THROAT WITH OR WITHOUT PRESENCE OF ULCERS  *URINARY PROBLEMS  *BOWEL PROBLEMS  UNUSUAL RASH Items with * indicate a potential emergency and should be followed up as soon as possible.  Feel free to call the clinic you have any questions or concerns. The clinic phone number is [phone].    

## 2012-12-21 NOTE — Progress Notes (Signed)
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 The patient underwent right mastectomy and sentinel lymph node biopsy which showed a T1 CN 0M0 ER/PR positive HER-2/neu negative invasive ductal carcinoma. She was treated with adjuvant endocrine therapy with letrozole. 2) In January 2014, she complained of some jaw pain throat pain and an enlarged cervical lymph node. She saw Dr. Lazarus Salines who did a biopsy of the right maxillary alveolus which came back positive for invasive squamous cell carcinoma. CT scan show possible regional lymph node involvement. She went to [facility] and had surgery.  3) On 03/18/2012 she underwent maxillectomy, right neck dissection, and flap reconstruction at [facility]. Unfortunately she had positive margins as well as regional lymph node involvement. There were presence of lymphovascular invasion and perineural invasion. The size of the tumor was 5 cm in greatest dimension final staging was T4, N1, M0. 4) She returned to [facility] for further evaluation after surgery. The patient has canceled her appointments to See a Medical Oncologist [facility]. Ultimately, she underwent adjuvant radiation therapy only. 5) In September 2014, repeat PET/CT scan showed evidence of metastatic disease to the lungs. She's been referred here for further management. The patient received cycle 1 of chemotherapy last week on 11/09/2012 Her treatment was complicated by severe anemia. She received blood transfusion on 11/19/2012 6) 11/21/2014repeat PET CT scan show positive response to treatment  INTERVAL HISTORY: Kristine Morales 79 y.o. female  returns for further followup. She is taking some pain medicine as needed for her jaw pain. She is complaining of some fatigue. She denies any recent fever, chills, night sweats or abnormal weight loss The patient denies any recent signs or symptoms of bleeding such as spontaneous epistaxis, hematuria or hematochezia.  I have reviewed the past medical history, past surgical history, social history and family history with the patient and they are unchanged from previous note.  ALLERGIES:  has No Known Allergies.  MEDICATIONS:  Current Outpatient Prescriptions  Medication Sig Dispense Refill  . ALPHAGAN P 0.1 % SOLN Place 1 drop into both eyes 2 (two) times daily.       Marland Kitchen dexamethasone (DECADRON) 4 MG tablet Take 2 tablets (8 mg total) by mouth 2 (two) times daily with a meal. Take two times a day starting the day after chemotherapy for 3 days.  30 tablet  1  . diphenoxylate-atropine (LOMOTIL) 2.5-0.025 MG per tablet Take 1 tablet by mouth 4 (four) times daily as needed for diarrhea or loose stools.      . dorzolamide-timolol (COSOPT) 22.3-6.8 MG/ML ophthalmic solution Place 1 drop into both eyes 2 (two) times daily.       Marland Kitchen lidocaine-prilocaine (EMLA) cream Apply topically as needed. Apply to [facility] a Cath site at least one hour prior to needle stick  30 g  1  . loperamide (IMODIUM) 1 MG/5ML solution Take 10 mLs (2 mg total) by mouth 4 (four) times daily as needed for diarrhea or loose stools.  120 mL  5  . LORazepam (ATIVAN) 0.5 MG tablet Take 1 tablet (0.5 mg total) by mouth every 6 (six) hours as needed (nausea and/or vomiting.).  30 tablet  0  .  lovastatin (ALTOPREV) 40 MG 24 hr tablet Take 40 mg by mouth at bedtime.      . mirtazapine (REMERON SOL-TAB) 15 MG disintegrating tablet Take 15 mg by mouth at bedtime.      . nebivolol (BYSTOLIC) 5 MG tablet Take 1 tablet (5 mg total) by mouth daily.  30 tablet  3  . Nutritional Supplements (FEEDING SUPPLEMENT, OSMOLITE 1.2 CAL,) LIQD Begin 360 mL  Osmolite 1.2 QID with 60 mL free water before and after each bolus feeding.  1440 mL  0  . nystatin (MYCOSTATIN) 100000 UNIT/ML suspension Take 500,000 Units by mouth 4 (four) times daily.      . ondansetron (ZOFRAN) 8 MG tablet Take 1 tablet (8 mg total) by mouth 2 (two) times daily. Starting day after chemo then twice daily as needed for nausea and/or vomiting.  30 tablet  1  . ondansetron (ZOFRAN) 8 MG tablet Take 1 tablet (8 mg total) by mouth every 8 (eight) hours as needed for nausea.  30 tablet  1  . oxyCODONE-acetaminophen (PERCOCET/ROXICET) 5-325 MG per tablet Take 1 tablet by mouth every 8 (eight) hours as needed for severe pain.  30 tablet  0  . prochlorperazine (COMPAZINE) 10 MG tablet Take 1 tablet (10 mg total) by mouth every 6 (six) hours as needed (for nausea and/or vomiting).  30 tablet  1  . prochlorperazine (COMPAZINE) 10 MG tablet Take 1 tablet (10 mg total) by mouth every 6 (six) hours as needed (Nausea or vomiting).  30 tablet  1  . sodium fluoride (FLUORISHIELD) 1.1 % GEL dental gel Place 1 application onto teeth at bedtime.      . traZODone (DESYREL) 50 MG tablet Take 1 tablet (50 mg total) by mouth at bedtime.  30 tablet  1  . colchicine 0.6 MG tablet Take 0.6 mg by mouth daily.      Marland Kitchen letrozole (FEMARA) 2.5 MG tablet Take 1 tablet (2.5 mg total) by mouth daily.  30 tablet  11   No current facility-administered medications for this visit.    REVIEW OF SYSTEMS:   Constitutional: Denies fevers, chills or abnormal weight loss Eyes: Denies blurriness of vision Ears, nose, mouth, throat, and face: Denies mucositis or sore throat Respiratory: Denies cough, dyspnea or wheezes Cardiovascular: Denies palpitation, chest discomfort or lower extremity swelling Gastrointestinal:  Denies nausea, heartburn or change in bowel habits Skin: Denies abnormal skin rashes Lymphatics: Denies new lymphadenopathy or easy bruising Neurological:Denies numbness, tingling or new  weaknesses Behavioral/Psych: Mood is stable, no new changes  All other systems were reviewed with the patient and are negative.  PHYSICAL EXAMINATION: ECOG PERFORMANCE STATUS: 1 - Symptomatic but completely ambulatory  Filed Vitals:   12/21/12 1007  BP: 114/47  Pulse: 79  Temp: 97.8 F (36.6 C)  Resp: 18   Filed Weights   12/21/12 1007  Weight: 111 lb 3.2 oz (50.44 kg)    GENERAL:alert, no distress and comfortable. She looks thin and elderly and cachectic SKIN: skin color is pale, texture, turgor are normal, no rashes or significant lesions EYES: normal, Conjunctiva are pale and non-injected, sclera clear OROPHARYNX:no exudate, no erythema and lips, buccal mucosa, and tongue normal . Noted well-healed surgical scar NECK: Significant radiation and surgical induced fibrosis LYMPH:  no palpable lymphadenopathy in the cervical, axillary or inguinal LUNGS: clear to auscultation and percussion with normal breathing effort HEART: regular rate & rhythm and no murmurs and no lower extremity edema ABDOMEN:abdomen soft, non-tender and normal bowel sounds Musculoskeletal:no  cyanosis of digits and no clubbing  NEURO: alert & oriented x 3 with fluent speech, no focal motor/sensory deficits  LABORATORY DATA:  I have reviewed the data as listed    Component Value Date/Time   NA 137 12/13/2012 1332   NA 138 09/07/2012 1950   K 5.2* 12/13/2012 1332   K 3.6 09/07/2012 1950   CL 103 09/07/2012 1950   CL 104 02/09/2012 0919   CO2 25 12/13/2012 1332   CO2 25 09/07/2012 1950   GLUCOSE 103 12/13/2012 1332   GLUCOSE 99 09/07/2012 1950   GLUCOSE 142* 02/09/2012 0919   BUN 24.7 12/13/2012 1332   BUN 25* 09/07/2012 1950   CREATININE 0.9 12/13/2012 1332   CREATININE 1.09 09/07/2012 1950   CALCIUM 9.4 12/13/2012 1332   CALCIUM 9.7 09/07/2012 1950   PROT 7.2 12/13/2012 1332   PROT 7.2 09/07/2012 1950   ALBUMIN 3.5 12/13/2012 1332   ALBUMIN 3.6 09/07/2012 1950   AST 15 12/13/2012 1332   AST 17  09/07/2012 1950   ALT 17 12/13/2012 1332   ALT 13 09/07/2012 1950   ALKPHOS 69 12/13/2012 1332   ALKPHOS 75 09/07/2012 1950   BILITOT 0.27 12/13/2012 1332   BILITOT 0.3 09/07/2012 1950   GFRNONAA 47* 09/07/2012 1950   GFRAA 54* 09/07/2012 1950    No results found for this basename: SPEP,  UPEP,   kappa and lambda light chains    Lab Results  Component Value Date   WBC 4.2 12/21/2012   NEUTROABS 3.2 12/21/2012   HGB 7.8* 12/21/2012   HCT 24.8* 12/21/2012   MCV 74.0* 12/21/2012   PLT 184 12/21/2012      Chemistry      Component Value Date/Time   NA 137 12/13/2012 1332   NA 138 09/07/2012 1950   K 5.2* 12/13/2012 1332   K 3.6 09/07/2012 1950   CL 103 09/07/2012 1950   CL 104 02/09/2012 0919   CO2 25 12/13/2012 1332   CO2 25 09/07/2012 1950   BUN 24.7 12/13/2012 1332   BUN 25* 09/07/2012 1950   CREATININE 0.9 12/13/2012 1332   CREATININE 1.09 09/07/2012 1950      Component Value Date/Time   CALCIUM 9.4 12/13/2012 1332   CALCIUM 9.7 09/07/2012 1950   ALKPHOS 69 12/13/2012 1332   ALKPHOS 75 09/07/2012 1950   AST 15 12/13/2012 1332   AST 17 09/07/2012 1950   ALT 17 12/13/2012 1332   ALT 13 09/07/2012 1950   BILITOT 0.27 12/13/2012 1332   BILITOT 0.3 09/07/2012 1950     ASSESSMENT & PLAN:  #1 invasive squamous cell carcinoma of the left maxilla the lungs She tolerated chemotherapy well apart from profound pancytopenia. Recently, I reduced the dose of chemotherapy by 20%, and I intend to keep her infusion cycles to 3 weeks on, one-week off. She appeared to tolerate that well apart from very mild toxicity. Fortunately, repeat PET CT scan showed positive response to chemotherapy #2 anemia This is likely due to recent treatment.  The patient has symptoms from the anemia.  We discussed some of the risks, benefits, and alternatives of blood transfusions. The patient is symptomatic from anemia and the hemoglobin level is critically low.  Some of the side-effects to be expected including risks of  transfusion reactions, chills, infection, syndrome of volume overload and risk of hospitalization from various reasons and the patient is willing to proceed. I will give her one unit of blood transfusion today #3 facial pain PET CT scan  show no evidence of new disease. I recommend she continue pain medication as prescribed. She agreed. I wonder about side effects of nausea, constipation and sedation with pain medicine.  #4 malnutrition She will continue nutritional feeding.  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 It is only mild, grade 1. We will observe.   No orders of the defined types were placed in this encounter.   All questions were answered. The patient knows to call the clinic with any problems, questions or concerns. No barriers to learning was detected.    Caramia Boutin, MD 12/21/2012 10:54 AM

## 2012-12-22 LAB — TYPE AND SCREEN
ABO/RH(D): A NEG
Antibody Screen: NEGATIVE

## 2013-01-04 ENCOUNTER — Other Ambulatory Visit (HOSPITAL_BASED_OUTPATIENT_CLINIC_OR_DEPARTMENT_OTHER): Payer: Medicare Other

## 2013-01-04 ENCOUNTER — Telehealth: Payer: Self-pay | Admitting: Hematology and Oncology

## 2013-01-04 ENCOUNTER — Ambulatory Visit (HOSPITAL_BASED_OUTPATIENT_CLINIC_OR_DEPARTMENT_OTHER): Payer: Medicare Other

## 2013-01-04 ENCOUNTER — Encounter: Payer: Self-pay | Admitting: Hematology and Oncology

## 2013-01-04 ENCOUNTER — Ambulatory Visit (HOSPITAL_BASED_OUTPATIENT_CLINIC_OR_DEPARTMENT_OTHER): Payer: Medicare Other | Admitting: Hematology and Oncology

## 2013-01-04 VITALS — BP 130/65 | HR 91 | Temp 98.1°F | Resp 20 | Ht 68.0 in | Wt 115.3 lb

## 2013-01-04 DIAGNOSIS — C760 Malignant neoplasm of head, face and neck: Secondary | ICD-10-CM

## 2013-01-04 DIAGNOSIS — C030 Malignant neoplasm of upper gum: Secondary | ICD-10-CM

## 2013-01-04 DIAGNOSIS — C50911 Malignant neoplasm of unspecified site of right female breast: Secondary | ICD-10-CM

## 2013-01-04 DIAGNOSIS — R0981 Nasal congestion: Secondary | ICD-10-CM

## 2013-01-04 DIAGNOSIS — D6481 Anemia due to antineoplastic chemotherapy: Secondary | ICD-10-CM

## 2013-01-04 DIAGNOSIS — D649 Anemia, unspecified: Secondary | ICD-10-CM

## 2013-01-04 DIAGNOSIS — D630 Anemia in neoplastic disease: Secondary | ICD-10-CM

## 2013-01-04 DIAGNOSIS — G609 Hereditary and idiopathic neuropathy, unspecified: Secondary | ICD-10-CM

## 2013-01-04 DIAGNOSIS — C7800 Secondary malignant neoplasm of unspecified lung: Secondary | ICD-10-CM

## 2013-01-04 DIAGNOSIS — Z5111 Encounter for antineoplastic chemotherapy: Secondary | ICD-10-CM

## 2013-01-04 DIAGNOSIS — C069 Malignant neoplasm of mouth, unspecified: Secondary | ICD-10-CM

## 2013-01-04 DIAGNOSIS — J3489 Other specified disorders of nose and nasal sinuses: Secondary | ICD-10-CM

## 2013-01-04 DIAGNOSIS — E46 Unspecified protein-calorie malnutrition: Secondary | ICD-10-CM

## 2013-01-04 DIAGNOSIS — D72819 Decreased white blood cell count, unspecified: Secondary | ICD-10-CM

## 2013-01-04 HISTORY — DX: Nasal congestion: R09.81

## 2013-01-04 LAB — COMPREHENSIVE METABOLIC PANEL (CC13)
ALT: 14 U/L (ref 0–55)
AST: 16 U/L (ref 5–34)
Alkaline Phosphatase: 80 U/L (ref 40–150)
Anion Gap: 5 mEq/L (ref 3–11)
BUN: 20.8 mg/dL (ref 7.0–26.0)
CO2: 26 mEq/L (ref 22–29)
Calcium: 9.7 mg/dL (ref 8.4–10.4)
Chloride: 105 mEq/L (ref 98–109)
Creatinine: 0.8 mg/dL (ref 0.6–1.1)
Total Bilirubin: 0.28 mg/dL (ref 0.20–1.20)
Total Protein: 7.1 g/dL (ref 6.4–8.3)

## 2013-01-04 LAB — CBC WITH DIFFERENTIAL/PLATELET
Basophils Absolute: 0 10*3/uL (ref 0.0–0.1)
Eosinophils Absolute: 0 10*3/uL (ref 0.0–0.5)
HCT: 28.4 % — ABNORMAL LOW (ref 34.8–46.6)
HGB: 8.9 g/dL — ABNORMAL LOW (ref 11.6–15.9)
LYMPH%: 31.4 % (ref 14.0–49.7)
MCH: 24.2 pg — ABNORMAL LOW (ref 25.1–34.0)
MCHC: 31.3 g/dL — ABNORMAL LOW (ref 31.5–36.0)
MCV: 77.2 fL — ABNORMAL LOW (ref 79.5–101.0)
MONO%: 11.2 % (ref 0.0–14.0)
NEUT#: 1.8 10*3/uL (ref 1.5–6.5)
NEUT%: 57.1 % (ref 38.4–76.8)
Platelets: 250 10*3/uL (ref 145–400)
RBC: 3.68 10*6/uL — ABNORMAL LOW (ref 3.70–5.45)
RDW: 19.3 % — ABNORMAL HIGH (ref 11.2–14.5)

## 2013-01-04 MED ORDER — SODIUM CHLORIDE 0.9 % IJ SOLN
10.0000 mL | INTRAMUSCULAR | Status: DC | PRN
  Administered 2013-01-04: 10 mL
  Filled 2013-01-04: qty 10

## 2013-01-04 MED ORDER — SODIUM CHLORIDE 0.9 % IV SOLN
Freq: Once | INTRAVENOUS | Status: AC
  Administered 2013-01-04: 12:00:00 via INTRAVENOUS

## 2013-01-04 MED ORDER — DEXAMETHASONE SODIUM PHOSPHATE 20 MG/5ML IJ SOLN
INTRAMUSCULAR | Status: AC
  Filled 2013-01-04: qty 5

## 2013-01-04 MED ORDER — ONDANSETRON 16 MG/50ML IVPB (CHCC)
16.0000 mg | Freq: Once | INTRAVENOUS | Status: AC
  Administered 2013-01-04: 16 mg via INTRAVENOUS

## 2013-01-04 MED ORDER — DIPHENHYDRAMINE HCL 50 MG/ML IJ SOLN
INTRAMUSCULAR | Status: AC
  Filled 2013-01-04: qty 1

## 2013-01-04 MED ORDER — SODIUM CHLORIDE 0.9 % IV SOLN
102.0000 mg | Freq: Once | INTRAVENOUS | Status: AC
  Administered 2013-01-04: 102 mg via INTRAVENOUS
  Filled 2013-01-04: qty 17

## 2013-01-04 MED ORDER — DEXAMETHASONE SODIUM PHOSPHATE 20 MG/5ML IJ SOLN
20.0000 mg | Freq: Once | INTRAMUSCULAR | Status: AC
  Administered 2013-01-04: 20 mg via INTRAVENOUS

## 2013-01-04 MED ORDER — DIPHENHYDRAMINE HCL 50 MG/ML IJ SOLN
50.0000 mg | Freq: Once | INTRAMUSCULAR | Status: AC
  Administered 2013-01-04: 50 mg via INTRAVENOUS

## 2013-01-04 MED ORDER — FAMOTIDINE IN NACL 20-0.9 MG/50ML-% IV SOLN
20.0000 mg | Freq: Once | INTRAVENOUS | Status: AC
  Administered 2013-01-04: 20 mg via INTRAVENOUS

## 2013-01-04 MED ORDER — ONDANSETRON 16 MG/50ML IVPB (CHCC)
INTRAVENOUS | Status: AC
  Filled 2013-01-04: qty 16

## 2013-01-04 MED ORDER — HEPARIN SOD (PORK) LOCK FLUSH 100 UNIT/ML IV SOLN
500.0000 [IU] | Freq: Once | INTRAVENOUS | Status: AC | PRN
  Administered 2013-01-04: 500 [IU]
  Filled 2013-01-04: qty 5

## 2013-01-04 MED ORDER — SODIUM CHLORIDE 0.9 % IV SOLN
99.3600 mg | Freq: Once | INTRAVENOUS | Status: AC
  Administered 2013-01-04: 100 mg via INTRAVENOUS
  Filled 2013-01-04: qty 10

## 2013-01-04 MED ORDER — PACLITAXEL CHEMO INJECTION 300 MG/50ML: 64.0000 mg/m2 | Freq: Once | INTRAVENOUS | Status: DC

## 2013-01-04 MED ORDER — TRIAMCINOLONE ACETONIDE 55 MCG/ACT NA AERO: 2.0000 | INHALATION_SPRAY | Freq: Every day | NASAL | Status: DC

## 2013-01-04 NOTE — Patient Instructions (Signed)
[facility] Discharge Instructions for Patients Receiving Chemotherapy  Today you received the following chemotherapy agents Taxol/Carboplatin To help prevent nausea and vomiting after your treatment, we encourage you to take your nausea medication as prescribed.  If you develop nausea and vomiting that is not controlled by your nausea medication, call the clinic.   BELOW ARE SYMPTOMS THAT SHOULD BE REPORTED IMMEDIATELY:  *FEVER GREATER THAN 100.5 F  *CHILLS WITH OR WITHOUT FEVER  NAUSEA AND VOMITING THAT IS NOT CONTROLLED WITH YOUR NAUSEA MEDICATION  *UNUSUAL SHORTNESS OF BREATH  *UNUSUAL BRUISING OR BLEEDING  TENDERNESS IN MOUTH AND THROAT WITH OR WITHOUT PRESENCE OF ULCERS  *URINARY PROBLEMS  *BOWEL PROBLEMS  UNUSUAL RASH Items with * indicate a potential emergency and should be followed up as soon as possible.  Feel free to call the clinic you have any questions or concerns. The clinic phone number is [phone].    

## 2013-01-04 NOTE — Progress Notes (Signed)
[facility] OFFICE PROGRESS NOTE  Patient Care Team: Kermit Balo, DO as PCP - General (Geriatric Medicine)  DIAGNOSIS: Metastatic squamous cell carcinoma of the maxilla to the lungs, ongoing palliative chemotherapy  SUMMARY OF ONCOLOGIC HISTORY: 1) She had background history of breast cancer on the right found on diagnostic mammogram in 2011.She is previous patient of Dr.Magrinat. The patient underwent right mastectomy and sentinel lymph node biopsy which showed a T1 CN 0M0 ER/PR positive HER-2/neu negative invasive ductal carcinoma. She was treated with adjuvant endocrine therapy with letrozole. 2) In January 2014, she complained of some jaw pain throat pain and an enlarged cervical lymph node. She saw Dr. Lazarus Salines who did a biopsy of the right maxillary alveolus which came back positive for invasive squamous cell carcinoma. CT scan show possible regional lymph node involvement. She went to [facility] and had surgery.  3) On 03/18/2012 she underwent maxillectomy, right neck dissection, and flap reconstruction at [facility]. Unfortunately she had positive margins as well as regional lymph node involvement. There were presence of lymphovascular invasion and perineural invasion. The size of the tumor was 5 cm in greatest dimension final staging was T4, N1, M0. 4) She returned to [facility] for further evaluation after surgery. The patient has canceled her appointments to See a Medical Oncologist Many Times. Ultimately, she underwent adjuvant radiation therapy only. 5) In September 2014, repeat PET/CT scan showed evidence of metastatic disease to the lungs. She's been referred here for further management. The patient received cycle 1 of chemotherapy last week on 11/09/2012 Her treatment was complicated by severe anemia. She received blood transfusion on 11/19/2012 & 12/21/2012 6) 11/21/2014repeat PET CT scan show positive response to treatment 7) 01/03/2013 each cycle of  chemo is modified to 2 weeks on 1 week off  INTERVAL HISTORY: [name] 79 y.o. female returns for further followup. She complained of mild fatigue. The patient denies any recent signs or symptoms of bleeding such as spontaneous epistaxis, hematuria or hematochezia. Denies any peripheral neuropathy from chemotherapy. She had gained some weight since I saw her. She complained of some upper sinus congestion with nasal drainage. She denies any recent fever, chills, night sweats or abnormal weight loss  I have reviewed the past medical history, past surgical history, social history and family history with the patient and they are unchanged from previous note.  ALLERGIES:  has No Known Allergies.  MEDICATIONS:  Current Outpatient Prescriptions  Medication Sig Dispense Refill  . ALPHAGAN P 0.1 % SOLN Place 1 drop into both eyes 2 (two) times daily.       . colchicine 0.6 MG tablet Take 0.6 mg by mouth daily.      Marland Kitchen dexamethasone (DECADRON) 4 MG tablet Take 2 tablets (8 mg total) by mouth 2 (two) times daily with a meal. Take two times a day starting the day after chemotherapy for 3 days.  30 tablet  1  . diphenoxylate-atropine (LOMOTIL) 2.5-0.025 MG per tablet Take 1 tablet by mouth 4 (four) times daily as needed for diarrhea or loose stools.      . dorzolamide-timolol (COSOPT) 22.3-6.8 MG/ML ophthalmic solution Place 1 drop into both eyes 2 (two) times daily.       Marland Kitchen loperamide (IMODIUM) 1 MG/5ML solution Take 10 mLs (2 mg total) by mouth 4 (four) times daily as needed for diarrhea or loose stools.  120 mL  5  . LORazepam (ATIVAN) 0.5 MG tablet Take 1 tablet (0.5 mg total) by  mouth every 6 (six) hours as needed (nausea and/or vomiting.).  30 tablet  0  . mirtazapine (REMERON SOL-TAB) 15 MG disintegrating tablet Take 15 mg by mouth at bedtime.      . Nutritional Supplements (FEEDING SUPPLEMENT, OSMOLITE 1.2 CAL,) LIQD Begin 360 mL Osmolite 1.2 QID with 60 mL free water before and after each  bolus feeding.  1440 mL  0  . ondansetron (ZOFRAN) 8 MG tablet Take 1 tablet (8 mg total) by mouth every 8 (eight) hours as needed for nausea.  30 tablet  1  . oxyCODONE-acetaminophen (PERCOCET/ROXICET) 5-325 MG per tablet Take 1 tablet by mouth every 8 (eight) hours as needed for severe pain.  30 tablet  0  . prochlorperazine (COMPAZINE) 10 MG tablet Take 1 tablet (10 mg total) by mouth every 6 (six) hours as needed (Nausea or vomiting).  30 tablet  1  . sodium fluoride (FLUORISHIELD) 1.1 % GEL dental gel Place 1 application onto teeth at bedtime.      . traZODone (DESYREL) 50 MG tablet Take 1 tablet (50 mg total) by mouth at bedtime.  30 tablet  1  . lidocaine-prilocaine (EMLA) cream Apply topically as needed. Apply to [facility] a Cath site at least one hour prior to needle stick  30 g  1  . triamcinolone (NASACORT AQ) 55 MCG/ACT AERO nasal inhaler Place 2 sprays into the nose daily.  1 Inhaler  12   No current facility-administered medications for this visit.    REVIEW OF SYSTEMS:   Eyes: Denies blurriness of vision Respiratory: Denies cough, dyspnea or wheezes Cardiovascular: Denies palpitation, chest discomfort or lower extremity swelling Gastrointestinal:  Denies nausea, heartburn or change in bowel habits Skin: Denies abnormal skin rashes Lymphatics: Denies new lymphadenopathy or easy bruising Neurological:Denies numbness, tingling or new weaknesses Behavioral/Psych: Mood is stable, no new changes  All other systems were reviewed with the patient and are negative.  PHYSICAL EXAMINATION: ECOG PERFORMANCE STATUS: 1 - Symptomatic but completely ambulatory  Filed Vitals:   01/04/13 1022  BP: 130/65  Pulse: 91  Temp: 98.1 F (36.7 C)  Resp: 20   Filed Weights   01/04/13 1022  Weight: 115 lb 4.8 oz (52.3 kg)    GENERAL:alert, no distress and comfortable. She looks thin and mildly cachectic SKIN: skin color, texture, turgor are normal, no rashes or significant lesions EYES:  normal, Conjunctiva are pink and non-injected, sclera clear OROPHARYNX:no exudate, no erythema and lips, buccal mucosa, and tongue normal . Significant reconstruction surgery on the face NECK: Significant radiation-induced fibrosis and scar tissue from surgery  LYMPH:  no palpable lymphadenopathy in the cervical, axillary or inguinal LUNGS: clear to auscultation and percussion with normal breathing effort HEART: regular rate & rhythm and no murmurs and no lower extremity edema ABDOMEN:abdomen soft, non-tender and normal bowel sounds feeding tube site looks okay Musculoskeletal:no cyanosis of digits and no clubbing  NEURO: alert & oriented x 3 with fluent speech, no focal motor/sensory deficits  LABORATORY DATA:  I have reviewed the data as listed    Component Value Date/Time   NA 137 12/21/2012 0954   NA 138 09/07/2012 1950   K 4.9 12/21/2012 0954   K 3.6 09/07/2012 1950   CL 103 09/07/2012 1950   CL 104 02/09/2012 0919   CO2 25 12/21/2012 0954   CO2 25 09/07/2012 1950   GLUCOSE 104 12/21/2012 0954   GLUCOSE 99 09/07/2012 1950   GLUCOSE 142* 02/09/2012 0919   BUN 17.8 12/21/2012 0954  BUN 25* 09/07/2012 1950   CREATININE 0.8 12/21/2012 0954   CREATININE 1.09 09/07/2012 1950   CALCIUM 9.5 12/21/2012 0954   CALCIUM 9.7 09/07/2012 1950   PROT 6.9 12/21/2012 0954   PROT 7.2 09/07/2012 1950   ALBUMIN 3.4* 12/21/2012 0954   ALBUMIN 3.6 09/07/2012 1950   AST 16 12/21/2012 0954   AST 17 09/07/2012 1950   ALT 12 12/21/2012 0954   ALT 13 09/07/2012 1950   ALKPHOS 71 12/21/2012 0954   ALKPHOS 75 09/07/2012 1950   BILITOT <0.20 12/21/2012 0954   BILITOT 0.3 09/07/2012 1950   GFRNONAA 47* 09/07/2012 1950   GFRAA 54* 09/07/2012 1950    No results found for this basename: SPEP,  UPEP,   kappa and lambda light chains    Lab Results  Component Value Date   WBC 3.2* 01/04/2013   NEUTROABS 1.8 01/04/2013   HGB 8.9* 01/04/2013   HCT 28.4* 01/04/2013   MCV 77.2* 01/04/2013   PLT 250 01/04/2013       Chemistry      Component Value Date/Time   NA 137 12/21/2012 0954   NA 138 09/07/2012 1950   K 4.9 12/21/2012 0954   K 3.6 09/07/2012 1950   CL 103 09/07/2012 1950   CL 104 02/09/2012 0919   CO2 25 12/21/2012 0954   CO2 25 09/07/2012 1950   BUN 17.8 12/21/2012 0954   BUN 25* 09/07/2012 1950   CREATININE 0.8 12/21/2012 0954   CREATININE 1.09 09/07/2012 1950      Component Value Date/Time   CALCIUM 9.5 12/21/2012 0954   CALCIUM 9.7 09/07/2012 1950   ALKPHOS 71 12/21/2012 0954   ALKPHOS 75 09/07/2012 1950   AST 16 12/21/2012 0954   AST 17 09/07/2012 1950   ALT 12 12/21/2012 0954   ALT 13 09/07/2012 1950   BILITOT <0.20 12/21/2012 0954   BILITOT 0.3 09/07/2012 1950      ASSESSMENT & PLAN:  #1 metastatic squamous cell carcinoma of the left maxilla the lungs She tolerated chemotherapy well apart from profound pancytopenia. Recently, I reduced the dose of chemotherapy by 20%, and I intend to keep her infusion cycles to 3 weeks on, one-week off. She appeared to tolerate that well apart from very mild toxicity. Fortunately, repeat PET CT scan showed positive response to chemotherapy From now on, I recommend further adjustments to her chemotherapy cycle to 2 weeks on 1 week off. #2 anemia This is likely due to recent treatment. The patient denies recent history of bleeding such as epistaxis, hematuria or hematochezia. She is asymptomatic from the anemia. I will observe for now.  She does not require transfusion now. I will continue the chemotherapy at current dose without dosage adjustment.  If the anemia gets progressive worse in the future, I might have to delay her treatment or adjust the chemotherapy dose. #3 nasal congestion Clinically, I think this is mild sinusitis. I recommend Nasacort  #4 malnutrition She will continue nutritional feeding.  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 It is only mild, grade 1. We will observe. #6 mild leukopenia This is likely due to recent  treatment. The patient denies recent history of fevers, cough, chills, diarrhea or dysuria. She is asymptomatic from the leukopenia. I will observe for now.  I will continue the chemotherapy at current dose without dosage adjustment.  If the leukopenia gets progressive worse in the future, I might have to delay her treatment or adjust the chemotherapy dose.  Orders Placed This  Encounter  Procedures  . Hold Tube, Blood Bank    Standing Status: Future     Number of Occurrences:      Standing Expiration Date: 01/04/2014  . Hold Tube, Blood Bank    Standing Status: Future     Number of Occurrences:      Standing Expiration Date: 01/04/2014   All questions were answered. The patient knows to call the clinic with any problems, questions or concerns. No barriers to learning was detected.    Donnelle Rubey, MD 01/04/2013 10:49 AM

## 2013-01-04 NOTE — Telephone Encounter (Signed)
gv pt appt schedule for december and january.

## 2013-01-11 ENCOUNTER — Ambulatory Visit (HOSPITAL_BASED_OUTPATIENT_CLINIC_OR_DEPARTMENT_OTHER): Payer: Medicare Other

## 2013-01-11 ENCOUNTER — Other Ambulatory Visit (HOSPITAL_BASED_OUTPATIENT_CLINIC_OR_DEPARTMENT_OTHER): Payer: Medicare Other

## 2013-01-11 VITALS — BP 135/64 | HR 87 | Temp 97.8°F | Resp 20

## 2013-01-11 DIAGNOSIS — C50911 Malignant neoplasm of unspecified site of right female breast: Secondary | ICD-10-CM

## 2013-01-11 DIAGNOSIS — C760 Malignant neoplasm of head, face and neck: Secondary | ICD-10-CM

## 2013-01-11 DIAGNOSIS — C069 Malignant neoplasm of mouth, unspecified: Secondary | ICD-10-CM

## 2013-01-11 DIAGNOSIS — C039 Malignant neoplasm of gum, unspecified: Secondary | ICD-10-CM

## 2013-01-11 DIAGNOSIS — D630 Anemia in neoplastic disease: Secondary | ICD-10-CM

## 2013-01-11 DIAGNOSIS — Z5111 Encounter for antineoplastic chemotherapy: Secondary | ICD-10-CM

## 2013-01-11 DIAGNOSIS — C7800 Secondary malignant neoplasm of unspecified lung: Secondary | ICD-10-CM

## 2013-01-11 DIAGNOSIS — C030 Malignant neoplasm of upper gum: Secondary | ICD-10-CM

## 2013-01-11 LAB — COMPREHENSIVE METABOLIC PANEL (CC13)
ALT: 12 U/L (ref 0–55)
AST: 16 U/L (ref 5–34)
Albumin: 3.7 g/dL (ref 3.5–5.0)
Alkaline Phosphatase: 79 U/L (ref 40–150)
BUN: 25.6 mg/dL (ref 7.0–26.0)
Calcium: 9.4 mg/dL (ref 8.4–10.4)
Creatinine: 1 mg/dL (ref 0.6–1.1)
Potassium: 5.1 mEq/L (ref 3.5–5.1)

## 2013-01-11 LAB — CBC WITH DIFFERENTIAL/PLATELET
BASO%: 0.3 % (ref 0.0–2.0)
Basophils Absolute: 0 10*3/uL (ref 0.0–0.1)
EOS%: 0.6 % (ref 0.0–7.0)
Eosinophils Absolute: 0 10*3/uL (ref 0.0–0.5)
HGB: 8.5 g/dL — ABNORMAL LOW (ref 11.6–15.9)
LYMPH%: 26.4 % (ref 14.0–49.7)
MCV: 76.8 fL — ABNORMAL LOW (ref 79.5–101.0)
MONO#: 0.3 10*3/uL (ref 0.1–0.9)
NEUT#: 2.1 10*3/uL (ref 1.5–6.5)
Platelets: 178 10*3/uL (ref 145–400)
RBC: 3.45 10*6/uL — ABNORMAL LOW (ref 3.70–5.45)
RDW: 18.9 % — ABNORMAL HIGH (ref 11.2–14.5)
WBC: 3.3 10*3/uL — ABNORMAL LOW (ref 3.9–10.3)
nRBC: 0 % (ref 0–0)

## 2013-01-11 LAB — HOLD TUBE, BLOOD BANK

## 2013-01-11 MED ORDER — ONDANSETRON 16 MG/50ML IVPB (CHCC)
16.0000 mg | Freq: Once | INTRAVENOUS | Status: AC
  Administered 2013-01-11: 16 mg via INTRAVENOUS

## 2013-01-11 MED ORDER — DIPHENHYDRAMINE HCL 50 MG/ML IJ SOLN
50.0000 mg | Freq: Once | INTRAMUSCULAR | Status: AC
  Administered 2013-01-11: 50 mg via INTRAVENOUS

## 2013-01-11 MED ORDER — HEPARIN SOD (PORK) LOCK FLUSH 100 UNIT/ML IV SOLN
500.0000 [IU] | Freq: Once | INTRAVENOUS | Status: AC | PRN
  Administered 2013-01-11: 500 [IU]
  Filled 2013-01-11: qty 5

## 2013-01-11 MED ORDER — SODIUM CHLORIDE 0.9 % IV SOLN
Freq: Once | INTRAVENOUS | Status: AC
  Administered 2013-01-11: 10:00:00 via INTRAVENOUS

## 2013-01-11 MED ORDER — SODIUM CHLORIDE 0.9 % IJ SOLN
10.0000 mL | INTRAMUSCULAR | Status: DC | PRN
  Administered 2013-01-11: 10 mL
  Filled 2013-01-11: qty 10

## 2013-01-11 MED ORDER — DIPHENHYDRAMINE HCL 50 MG/ML IJ SOLN
INTRAMUSCULAR | Status: AC
  Filled 2013-01-11: qty 1

## 2013-01-11 MED ORDER — ONDANSETRON 16 MG/50ML IVPB (CHCC)
INTRAVENOUS | Status: AC
  Filled 2013-01-11: qty 16

## 2013-01-11 MED ORDER — DEXAMETHASONE SODIUM PHOSPHATE 20 MG/5ML IJ SOLN
INTRAMUSCULAR | Status: AC
  Filled 2013-01-11: qty 5

## 2013-01-11 MED ORDER — FAMOTIDINE IN NACL 20-0.9 MG/50ML-% IV SOLN
INTRAVENOUS | Status: AC
  Filled 2013-01-11: qty 50

## 2013-01-11 MED ORDER — SODIUM CHLORIDE 0.9 % IV SOLN
99.3600 mg | Freq: Once | INTRAVENOUS | Status: AC
  Administered 2013-01-11: 100 mg via INTRAVENOUS
  Filled 2013-01-11: qty 10

## 2013-01-11 MED ORDER — DEXAMETHASONE SODIUM PHOSPHATE 20 MG/5ML IJ SOLN
20.0000 mg | Freq: Once | INTRAMUSCULAR | Status: AC
  Administered 2013-01-11: 20 mg via INTRAVENOUS

## 2013-01-11 MED ORDER — FAMOTIDINE IN NACL 20-0.9 MG/50ML-% IV SOLN
20.0000 mg | Freq: Once | INTRAVENOUS | Status: AC
  Administered 2013-01-11: 20 mg via INTRAVENOUS

## 2013-01-11 MED ORDER — SODIUM CHLORIDE 0.9 % IV SOLN
64.0000 mg/m2 | Freq: Once | INTRAVENOUS | Status: AC
  Administered 2013-01-11: 102 mg via INTRAVENOUS
  Filled 2013-01-11: qty 17

## 2013-01-11 NOTE — Patient Instructions (Addendum)
[facility] Discharge Instructions for Patients Receiving Chemotherapy  Today you received the following chemotherapy agents :  Taxol, Carboplatin.  To help prevent nausea and vomiting after your treatment, we encourage you to take your nausea medication as instructed by your physician.   If you develop nausea and vomiting that is not controlled by your nausea medication, call the clinic.   BELOW ARE SYMPTOMS THAT SHOULD BE REPORTED IMMEDIATELY:  *FEVER GREATER THAN 100.5 F  *CHILLS WITH OR WITHOUT FEVER  NAUSEA AND VOMITING THAT IS NOT CONTROLLED WITH YOUR NAUSEA MEDICATION  *UNUSUAL SHORTNESS OF BREATH  *UNUSUAL BRUISING OR BLEEDING  TENDERNESS IN MOUTH AND THROAT WITH OR WITHOUT PRESENCE OF ULCERS  *URINARY PROBLEMS  *BOWEL PROBLEMS  UNUSUAL RASH Items with * indicate a potential emergency and should be followed up as soon as possible.  Feel free to call the clinic should you have any questions or concerns. The clinic phone number is [phone].

## 2013-01-24 ENCOUNTER — Other Ambulatory Visit: Payer: Medicare Other | Admitting: Lab

## 2013-01-24 ENCOUNTER — Ambulatory Visit: Payer: Medicare Other | Admitting: Oncology

## 2013-01-25 ENCOUNTER — Ambulatory Visit (HOSPITAL_BASED_OUTPATIENT_CLINIC_OR_DEPARTMENT_OTHER): Payer: Medicare Other | Admitting: Hematology and Oncology

## 2013-01-25 ENCOUNTER — Other Ambulatory Visit (HOSPITAL_BASED_OUTPATIENT_CLINIC_OR_DEPARTMENT_OTHER): Payer: Medicare Other

## 2013-01-25 ENCOUNTER — Encounter: Payer: Self-pay | Admitting: Hematology and Oncology

## 2013-01-25 ENCOUNTER — Telehealth: Payer: Self-pay | Admitting: *Deleted

## 2013-01-25 ENCOUNTER — Telehealth: Payer: Self-pay | Admitting: Hematology and Oncology

## 2013-01-25 ENCOUNTER — Ambulatory Visit (HOSPITAL_BASED_OUTPATIENT_CLINIC_OR_DEPARTMENT_OTHER): Payer: Medicare Other

## 2013-01-25 VITALS — BP 136/56 | HR 86 | Temp 98.5°F | Resp 18 | Ht 68.0 in | Wt 110.8 lb

## 2013-01-25 DIAGNOSIS — C760 Malignant neoplasm of head, face and neck: Secondary | ICD-10-CM

## 2013-01-25 DIAGNOSIS — K121 Other forms of stomatitis: Secondary | ICD-10-CM

## 2013-01-25 DIAGNOSIS — C50911 Malignant neoplasm of unspecified site of right female breast: Secondary | ICD-10-CM

## 2013-01-25 DIAGNOSIS — E46 Unspecified protein-calorie malnutrition: Secondary | ICD-10-CM

## 2013-01-25 DIAGNOSIS — R634 Abnormal weight loss: Secondary | ICD-10-CM

## 2013-01-25 DIAGNOSIS — C030 Malignant neoplasm of upper gum: Secondary | ICD-10-CM

## 2013-01-25 DIAGNOSIS — C069 Malignant neoplasm of mouth, unspecified: Secondary | ICD-10-CM

## 2013-01-25 DIAGNOSIS — D630 Anemia in neoplastic disease: Secondary | ICD-10-CM

## 2013-01-25 DIAGNOSIS — K1230 Oral mucositis (ulcerative), unspecified: Secondary | ICD-10-CM

## 2013-01-25 DIAGNOSIS — D649 Anemia, unspecified: Secondary | ICD-10-CM

## 2013-01-25 DIAGNOSIS — Z5111 Encounter for antineoplastic chemotherapy: Secondary | ICD-10-CM

## 2013-01-25 DIAGNOSIS — C7800 Secondary malignant neoplasm of unspecified lung: Secondary | ICD-10-CM

## 2013-01-25 DIAGNOSIS — B370 Candidal stomatitis: Secondary | ICD-10-CM

## 2013-01-25 HISTORY — DX: Candidal stomatitis: B37.0

## 2013-01-25 LAB — COMPREHENSIVE METABOLIC PANEL (CC13)
ALBUMIN: 3.7 g/dL (ref 3.5–5.0)
ALK PHOS: 83 U/L (ref 40–150)
ALT: 9 U/L (ref 0–55)
AST: 11 U/L (ref 5–34)
Anion Gap: 8 mEq/L (ref 3–11)
BUN: 16.4 mg/dL (ref 7.0–26.0)
CO2: 23 mEq/L (ref 22–29)
CREATININE: 0.8 mg/dL (ref 0.6–1.1)
Calcium: 9.7 mg/dL (ref 8.4–10.4)
Chloride: 105 mEq/L (ref 98–109)
Glucose: 102 mg/dl (ref 70–140)
POTASSIUM: 4.7 meq/L (ref 3.5–5.1)
Sodium: 137 mEq/L (ref 136–145)
Total Bilirubin: 0.55 mg/dL (ref 0.20–1.20)
Total Protein: 7.3 g/dL (ref 6.4–8.3)

## 2013-01-25 LAB — CBC WITH DIFFERENTIAL/PLATELET
BASO%: 0.3 % (ref 0.0–2.0)
Basophils Absolute: 0 10*3/uL (ref 0.0–0.1)
EOS%: 0 % (ref 0.0–7.0)
Eosinophils Absolute: 0 10*3/uL (ref 0.0–0.5)
HCT: 27 % — ABNORMAL LOW (ref 34.8–46.6)
HGB: 8.4 g/dL — ABNORMAL LOW (ref 11.6–15.9)
LYMPH#: 1.2 10*3/uL (ref 0.9–3.3)
LYMPH%: 20.9 % (ref 14.0–49.7)
MCH: 24.4 pg — ABNORMAL LOW (ref 25.1–34.0)
MCHC: 31.1 g/dL — AB (ref 31.5–36.0)
MCV: 78.5 fL — ABNORMAL LOW (ref 79.5–101.0)
MONO#: 0.8 10*3/uL (ref 0.1–0.9)
MONO%: 13 % (ref 0.0–14.0)
NEUT#: 3.8 10*3/uL (ref 1.5–6.5)
NEUT%: 65.8 % (ref 38.4–76.8)
NRBC: 0 % (ref 0–0)
Platelets: 195 10*3/uL (ref 145–400)
RBC: 3.44 10*6/uL — AB (ref 3.70–5.45)
RDW: 18.7 % — ABNORMAL HIGH (ref 11.2–14.5)
WBC: 5.8 10*3/uL (ref 3.9–10.3)

## 2013-01-25 LAB — HOLD TUBE, BLOOD BANK

## 2013-01-25 MED ORDER — ONDANSETRON 16 MG/50ML IVPB (CHCC)
16.0000 mg | Freq: Once | INTRAVENOUS | Status: AC
  Administered 2013-01-25: 16 mg via INTRAVENOUS

## 2013-01-25 MED ORDER — DIPHENHYDRAMINE HCL 50 MG/ML IJ SOLN
INTRAMUSCULAR | Status: AC
  Filled 2013-01-25: qty 1

## 2013-01-25 MED ORDER — ONDANSETRON 16 MG/50ML IVPB (CHCC)
INTRAVENOUS | Status: AC
  Filled 2013-01-25: qty 16

## 2013-01-25 MED ORDER — DEXAMETHASONE SODIUM PHOSPHATE 20 MG/5ML IJ SOLN
INTRAMUSCULAR | Status: AC
  Filled 2013-01-25: qty 5

## 2013-01-25 MED ORDER — FAMOTIDINE IN NACL 20-0.9 MG/50ML-% IV SOLN
INTRAVENOUS | Status: AC
  Filled 2013-01-25: qty 50

## 2013-01-25 MED ORDER — SODIUM CHLORIDE 0.9 % IV SOLN
64.0000 mg/m2 | Freq: Once | INTRAVENOUS | Status: AC
  Administered 2013-01-25: 102 mg via INTRAVENOUS
  Filled 2013-01-25: qty 17

## 2013-01-25 MED ORDER — SODIUM CHLORIDE 0.9 % IJ SOLN
10.0000 mL | INTRAMUSCULAR | Status: DC | PRN
  Administered 2013-01-25: 10 mL
  Filled 2013-01-25: qty 10

## 2013-01-25 MED ORDER — DIPHENHYDRAMINE HCL 50 MG/ML IJ SOLN
50.0000 mg | Freq: Once | INTRAMUSCULAR | Status: AC
  Administered 2013-01-25: 50 mg via INTRAVENOUS

## 2013-01-25 MED ORDER — SODIUM CHLORIDE 0.9 % IV SOLN
Freq: Once | INTRAVENOUS | Status: AC
  Administered 2013-01-25: 14:00:00 via INTRAVENOUS

## 2013-01-25 MED ORDER — DEXAMETHASONE SODIUM PHOSPHATE 20 MG/5ML IJ SOLN
20.0000 mg | Freq: Once | INTRAMUSCULAR | Status: AC
  Administered 2013-01-25: 20 mg via INTRAVENOUS

## 2013-01-25 MED ORDER — HEPARIN SOD (PORK) LOCK FLUSH 100 UNIT/ML IV SOLN
500.0000 [IU] | Freq: Once | INTRAVENOUS | Status: AC | PRN
  Administered 2013-01-25: 500 [IU]
  Filled 2013-01-25: qty 5

## 2013-01-25 MED ORDER — FLUCONAZOLE 100 MG PO TABS: 100.0000 mg | ORAL_TABLET | Freq: Every day | ORAL | Status: DC

## 2013-01-25 MED ORDER — FAMOTIDINE IN NACL 20-0.9 MG/50ML-% IV SOLN
20.0000 mg | Freq: Once | INTRAVENOUS | Status: AC
  Administered 2013-01-25: 20 mg via INTRAVENOUS

## 2013-01-25 MED ORDER — SODIUM CHLORIDE 0.9 % IV SOLN
99.3600 mg | Freq: Once | INTRAVENOUS | Status: AC
  Administered 2013-01-25: 100 mg via INTRAVENOUS
  Filled 2013-01-25: qty 10

## 2013-01-25 NOTE — Progress Notes (Signed)
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 The patient underwent right mastectomy and sentinel lymph node biopsy which showed a T1 CN 0M0 ER/PR positive HER-2/neu negative invasive ductal carcinoma. She was treated with adjuvant endocrine therapy with letrozole. 2) In January 2014, she complained of some jaw pain throat pain and an enlarged cervical lymph node. She saw Dr. Erik Obey who did a biopsy of the right maxillary alveolus which came back positive for invasive squamous cell carcinoma. CT scan show possible regional lymph node involvement. She went to [facility] and had surgery.  3) On 03/18/2012 she underwent maxillectomy, right neck dissection, and flap reconstruction at [facility]. Unfortunately she had positive margins as well as regional lymph node involvement. There were presence of lymphovascular invasion and perineural invasion. The size of the tumor was 5 cm in greatest dimension final staging was T4, N1, M0. 4) She returned to [facility] for further evaluation after surgery. The patient has canceled her appointments to See a Medical Oncologist Many Times. Ultimately, she underwent adjuvant radiation therapy only. 5) In September 2014, repeat PET/CT scan showed evidence of metastatic disease to the lungs. She's been referred here for further management. The patient received cycle 1 of chemotherapy last week on 11/09/2012 Her treatment was complicated by severe anemia. She received blood transfusion on 11/19/2012 & 12/21/2012 6) 11/21/2014repeat PET CT scan show positive response to treatment 7) 01/03/2013 each cycle of  chemo is modified to 2 weeks on 1 week off  INTERVAL HISTORY: Kristine Morales 80 y.o. female returns for further followup. She complained of some ear ache for the past 3 days. She has lost some weight because she stopped using her feeding tube. She attempted to use a feeding tube recently and he leaked all over. She finally gave up using it. She has persistent dysphasia and able to tolerate only some soup. She denies any recent fever, chills, night sweats or abnormal weight loss  I have reviewed the past medical history, past surgical history, social history and family history with the patient and they are unchanged from previous note.  ALLERGIES:  has No Known Allergies.  MEDICATIONS:  Current Outpatient Prescriptions  Medication Sig Dispense Refill  . ALPHAGAN P 0.1 % SOLN Place 1 drop into both eyes 2 (two) times daily.       . colchicine 0.6 MG tablet Take 0.6 mg by mouth daily.      Marland Kitchen dexamethasone (DECADRON) 4 MG tablet Take 2 tablets (8 mg total) by mouth 2 (two) times daily with a meal. Take two times a day starting the day after chemotherapy for 3 days.  30 tablet  1  . diphenoxylate-atropine (LOMOTIL) 2.5-0.025 MG per tablet Take 1 tablet by mouth 4 (four) times daily as needed for diarrhea or loose stools.      . dorzolamide-timolol (COSOPT) 22.3-6.8 MG/ML ophthalmic solution Place 1 drop into both eyes 2 (two) times daily.       Marland Kitchen lidocaine-prilocaine (EMLA) cream Apply topically as needed. Apply to [facility] a Cath site at least one hour prior to needle stick  30 g  1  . loperamide (IMODIUM) 1 MG/5ML solution Take 10 mLs (2  mg total) by mouth 4 (four) times daily as needed for diarrhea or loose stools.  120 mL  5  . LORazepam (ATIVAN) 0.5 MG tablet Take 1 tablet (0.5 mg total) by mouth every 6 (six) hours as needed (nausea and/or vomiting.).  30 tablet  0  . mirtazapine (REMERON SOL-TAB) 15 MG disintegrating tablet Take 15 mg by mouth at bedtime.      . Nutritional Supplements  (FEEDING SUPPLEMENT, OSMOLITE 1.2 CAL,) LIQD Begin 360 mL Osmolite 1.2 QID with 60 mL free water before and after each bolus feeding.  1440 mL  0  . ondansetron (ZOFRAN) 8 MG tablet Take 1 tablet (8 mg total) by mouth every 8 (eight) hours as needed for nausea.  30 tablet  1  . oxyCODONE-acetaminophen (PERCOCET/ROXICET) 5-325 MG per tablet Take 1 tablet by mouth every 8 (eight) hours as needed for severe pain.  30 tablet  0  . prochlorperazine (COMPAZINE) 10 MG tablet Take 1 tablet (10 mg total) by mouth every 6 (six) hours as needed (Nausea or vomiting).  30 tablet  1  . sodium fluoride (FLUORISHIELD) 1.1 % GEL dental gel Place 1 application onto teeth at bedtime.      . traZODone (DESYREL) 50 MG tablet Take 1 tablet (50 mg total) by mouth at bedtime.  30 tablet  1  . triamcinolone (NASACORT AQ) 55 MCG/ACT AERO nasal inhaler Place 2 sprays into the nose daily.  1 Inhaler  12  . fluconazole (DIFLUCAN) 100 MG tablet Take 1 tablet (100 mg total) by mouth daily.  7 tablet  0   No current facility-administered medications for this visit.    REVIEW OF SYSTEMS:   Eyes: Denies blurriness of vision Ears, nose, mouth, throat, and face: Denies mucositis or sore throat Respiratory: Denies cough, dyspnea or wheezes Cardiovascular: Denies palpitation, chest discomfort or lower extremity swelling Gastrointestinal:  Denies nausea, heartburn or change in bowel habits Skin: Denies abnormal skin rashes Lymphatics: Denies new lymphadenopathy or easy bruising Neurological:Denies numbness, tingling or new weaknesses Behavioral/Psych: Mood is stable, no new changes  All other systems were reviewed with the patient and are negative.  PHYSICAL EXAMINATION: ECOG PERFORMANCE STATUS: 1 - Symptomatic but completely ambulatory  Filed Vitals:   01/25/13 1219  BP: 136/56  Pulse: 86  Temp: 98.5 F (36.9 C)  Resp: 18   Filed Weights   01/25/13 1219  Weight: 110 lb 12.8 oz (50.259 kg)    GENERAL:alert, no  distress and comfortable. She looks thin and cachectic SKIN: skin color, texture, turgor are normal, no rashes or significant lesions EYES: normal, Conjunctiva are pink and non-injected, sclera clear OROPHARYNX: No other evidence of thrush in her tongue and buccal mucosa. She also has very mild blister in her lip in the healing phase NECK: Significant surgery and radiation induced scarring of her neck  LYMPH:  no palpable lymphadenopathy in the cervical, axillary or inguinal LUNGS: clear to auscultation and percussion with normal breathing effort HEART: regular rate & rhythm and no murmurs and no lower extremity edema ABDOMEN:abdomen soft, non-tender and normal bowel sounds. Feeding tube site looks okay Musculoskeletal:no cyanosis of digits and no clubbing  NEURO: alert & oriented x 3 with fluent speech, no focal motor/sensory deficits  LABORATORY DATA:  I have reviewed the data as listed    Component Value Date/Time   NA 138 01/11/2013 0929   NA 138 09/07/2012 1950   K 5.1 01/11/2013 0929   K 3.6 09/07/2012 1950   CL 103  09/07/2012 1950   CL 104 02/09/2012 0919   CO2 22 01/11/2013 0929   CO2 25 09/07/2012 1950   GLUCOSE 101 01/11/2013 0929   GLUCOSE 99 09/07/2012 1950   GLUCOSE 142* 02/09/2012 0919   BUN 25.6 01/11/2013 0929   BUN 25* 09/07/2012 1950   CREATININE 1.0 01/11/2013 0929   CREATININE 1.09 09/07/2012 1950   CALCIUM 9.4 01/11/2013 0929   CALCIUM 9.7 09/07/2012 1950   PROT 7.1 01/11/2013 0929   PROT 7.2 09/07/2012 1950   ALBUMIN 3.7 01/11/2013 0929   ALBUMIN 3.6 09/07/2012 1950   AST 16 01/11/2013 0929   AST 17 09/07/2012 1950   ALT 12 01/11/2013 0929   ALT 13 09/07/2012 1950   ALKPHOS 79 01/11/2013 0929   ALKPHOS 75 09/07/2012 1950   BILITOT 0.32 01/11/2013 0929   BILITOT 0.3 09/07/2012 1950   GFRNONAA 47* 09/07/2012 1950   GFRAA 54* 09/07/2012 1950    No results found for this basename: SPEP,  UPEP,   kappa and lambda light chains    Lab Results  Component Value Date    WBC 5.8 01/25/2013   NEUTROABS 3.8 01/25/2013   HGB 8.4* 01/25/2013   HCT 27.0* 01/25/2013   MCV 78.5* 01/25/2013   PLT 195 01/25/2013      Chemistry      Component Value Date/Time   NA 138 01/11/2013 0929   NA 138 09/07/2012 1950   K 5.1 01/11/2013 0929   K 3.6 09/07/2012 1950   CL 103 09/07/2012 1950   CL 104 02/09/2012 0919   CO2 22 01/11/2013 0929   CO2 25 09/07/2012 1950   BUN 25.6 01/11/2013 0929   BUN 25* 09/07/2012 1950   CREATININE 1.0 01/11/2013 0929   CREATININE 1.09 09/07/2012 1950      Component Value Date/Time   CALCIUM 9.4 01/11/2013 0929   CALCIUM 9.7 09/07/2012 1950   ALKPHOS 79 01/11/2013 0929   ALKPHOS 75 09/07/2012 1950   AST 16 01/11/2013 0929   AST 17 09/07/2012 1950   ALT 12 01/11/2013 0929   ALT 13 09/07/2012 1950   BILITOT 0.32 01/11/2013 0929   BILITOT 0.3 09/07/2012 1950     ASSESSMENT & PLAN:  #1 metastatic squamous cell carcinoma of the left maxilla the lungs She tolerated chemotherapy well apart from profound pancytopenia. Recently, I reduced the dose of chemotherapy by 20%. Fortunately, repeat PET CT scan showed positive response to chemotherapy From now on, I recommend further adjustments to her chemotherapy cycle to 2 weeks on 1 week off. #2 anemia This is likely due to recent treatment. The patient denies recent history of bleeding such as epistaxis, hematuria or hematochezia. She is asymptomatic from the anemia. I will observe for now.  She does not require transfusion now. I will continue the chemotherapy at current dose without dosage adjustment.  If the anemia gets progressive worse in the future, I might have to delay her treatment or adjust the chemotherapy dose. #3 Ear ache Clinically, I do not see any signs of ear infection .  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 It is only mild, grade 1. We will observe. #6 mild mucositis and thrush I will go ahead and prescribed fluconazole for 1 week but will not delay her treatment #7  malnutrition with weight loss I will adjust the chemotherapy based on the most recent weight and continue on a 20% dose adjustment. I encouraged her to use a feeding tube. I would discuss this with the  dietitian later.  Orders Placed This Encounter  Procedures  . Hold Tube, Blood Bank    Standing Status: Standing     Number of Occurrences: 9     Standing Expiration Date: 01/25/2014   All questions were answered. The patient knows to call the clinic with any problems, questions or concerns. No barriers to learning was detected.    [facility], [place], MD 01/25/2013 12:47 PM

## 2013-01-25 NOTE — Progress Notes (Signed)
Per Dr. Alvy Bimler office note dtd 01/25/13, proceed with treatment with Hgb 8.4.  No transfusion.

## 2013-01-25 NOTE — Patient Instructions (Signed)
[facility] Discharge Instructions for Patients Receiving Chemotherapy  Today you received the following chemotherapy agents: taxol, carboplatin  To help prevent nausea and vomiting after your treatment, we encourage you to take your nausea medication.  Take it as often as prescribed.     If you develop nausea and vomiting that is not controlled by your nausea medication, call the clinic. If it is after clinic hours your family physician or the after hours number for the clinic or go to the Emergency Department.   BELOW ARE SYMPTOMS THAT SHOULD BE REPORTED IMMEDIATELY:  *FEVER GREATER THAN 100.5 F  *CHILLS WITH OR WITHOUT FEVER  NAUSEA AND VOMITING THAT IS NOT CONTROLLED WITH YOUR NAUSEA MEDICATION  *UNUSUAL SHORTNESS OF BREATH  *UNUSUAL BRUISING OR BLEEDING  TENDERNESS IN MOUTH AND THROAT WITH OR WITHOUT PRESENCE OF ULCERS  *URINARY PROBLEMS  *BOWEL PROBLEMS  UNUSUAL RASH Items with * indicate a potential emergency and should be followed up as soon as possible.  Feel free to call the clinic you have any questions or concerns. The clinic phone number is [phone].   I have been informed and understand all the instructions given to me. I know to contact the clinic, my physician, or go to the Emergency Department if any problems should occur. I do not have any questions at this time, but understand that I may call the clinic during office hours   should I have any questions or need assistance in obtaining follow up care.    __________________________________________  _____________  __________ Signature of Patient or Authorized Representative            Date                   Time    __________________________________________ Nurse's Signature    

## 2013-01-25 NOTE — Telephone Encounter (Signed)
Per staff message and POF I have scheduled appts.  JMW  

## 2013-01-25 NOTE — Telephone Encounter (Signed)
Gave pt appt for lab, chemo  and MD for January and Febtruary 2015

## 2013-01-30 ENCOUNTER — Encounter: Payer: Self-pay | Admitting: *Deleted

## 2013-01-30 NOTE — Progress Notes (Signed)
As requested by Dr. Nicolette Bang, faxed CT scan of Neck from 09/23/12 and PET scan from 12/09/12 on [facility].

## 2013-01-31 ENCOUNTER — Ambulatory Visit: Payer: Medicare Other | Admitting: Oncology

## 2013-02-01 ENCOUNTER — Ambulatory Visit: Payer: Medicare Other | Admitting: Nutrition

## 2013-02-01 ENCOUNTER — Ambulatory Visit (HOSPITAL_BASED_OUTPATIENT_CLINIC_OR_DEPARTMENT_OTHER): Payer: Medicare Other

## 2013-02-01 ENCOUNTER — Other Ambulatory Visit (HOSPITAL_BASED_OUTPATIENT_CLINIC_OR_DEPARTMENT_OTHER): Payer: Medicare Other

## 2013-02-01 ENCOUNTER — Other Ambulatory Visit: Payer: Self-pay | Admitting: Hematology and Oncology

## 2013-02-01 ENCOUNTER — Other Ambulatory Visit: Payer: Self-pay | Admitting: *Deleted

## 2013-02-01 VITALS — BP 146/71 | HR 82 | Temp 98.5°F | Resp 18

## 2013-02-01 DIAGNOSIS — Z5111 Encounter for antineoplastic chemotherapy: Secondary | ICD-10-CM

## 2013-02-01 DIAGNOSIS — C030 Malignant neoplasm of upper gum: Secondary | ICD-10-CM

## 2013-02-01 DIAGNOSIS — C069 Malignant neoplasm of mouth, unspecified: Secondary | ICD-10-CM

## 2013-02-01 DIAGNOSIS — D630 Anemia in neoplastic disease: Secondary | ICD-10-CM

## 2013-02-01 DIAGNOSIS — C760 Malignant neoplasm of head, face and neck: Secondary | ICD-10-CM

## 2013-02-01 DIAGNOSIS — C50911 Malignant neoplasm of unspecified site of right female breast: Secondary | ICD-10-CM

## 2013-02-01 DIAGNOSIS — C7800 Secondary malignant neoplasm of unspecified lung: Secondary | ICD-10-CM

## 2013-02-01 LAB — CBC WITH DIFFERENTIAL/PLATELET
BASO%: 0.3 % (ref 0.0–2.0)
BASOS ABS: 0 10*3/uL (ref 0.0–0.1)
EOS%: 0.3 % (ref 0.0–7.0)
Eosinophils Absolute: 0 10*3/uL (ref 0.0–0.5)
HCT: 26.2 % — ABNORMAL LOW (ref 34.8–46.6)
HEMOGLOBIN: 8.1 g/dL — AB (ref 11.6–15.9)
LYMPH%: 23.5 % (ref 14.0–49.7)
MCH: 24 pg — AB (ref 25.1–34.0)
MCHC: 30.9 g/dL — ABNORMAL LOW (ref 31.5–36.0)
MCV: 77.7 fL — ABNORMAL LOW (ref 79.5–101.0)
MONO#: 0.3 10*3/uL (ref 0.1–0.9)
MONO%: 6.6 % (ref 0.0–14.0)
NEUT#: 2.6 10*3/uL (ref 1.5–6.5)
NEUT%: 69.3 % (ref 38.4–76.8)
Platelets: 207 10*3/uL (ref 145–400)
RBC: 3.37 10*6/uL — AB (ref 3.70–5.45)
RDW: 17.7 % — AB (ref 11.2–14.5)
WBC: 3.8 10*3/uL — ABNORMAL LOW (ref 3.9–10.3)
lymph#: 0.9 10*3/uL (ref 0.9–3.3)
nRBC: 0 % (ref 0–0)

## 2013-02-01 LAB — COMPREHENSIVE METABOLIC PANEL (CC13)
ALT: 14 U/L (ref 0–55)
AST: 17 U/L (ref 5–34)
Albumin: 3.5 g/dL (ref 3.5–5.0)
Alkaline Phosphatase: 70 U/L (ref 40–150)
Anion Gap: 7 mEq/L (ref 3–11)
BUN: 19.5 mg/dL (ref 7.0–26.0)
CO2: 25 mEq/L (ref 22–29)
Calcium: 9.4 mg/dL (ref 8.4–10.4)
Chloride: 105 mEq/L (ref 98–109)
Creatinine: 0.8 mg/dL (ref 0.6–1.1)
Glucose: 103 mg/dl (ref 70–140)
Potassium: 5.4 mEq/L — ABNORMAL HIGH (ref 3.5–5.1)
SODIUM: 138 meq/L (ref 136–145)
TOTAL PROTEIN: 6.9 g/dL (ref 6.4–8.3)
Total Bilirubin: 0.27 mg/dL (ref 0.20–1.20)

## 2013-02-01 LAB — HOLD TUBE, BLOOD BANK

## 2013-02-01 MED ORDER — FAMOTIDINE IN NACL 20-0.9 MG/50ML-% IV SOLN
INTRAVENOUS | Status: AC
  Filled 2013-02-01: qty 50

## 2013-02-01 MED ORDER — DIPHENHYDRAMINE HCL 50 MG/ML IJ SOLN
INTRAMUSCULAR | Status: AC
  Filled 2013-02-01: qty 1

## 2013-02-01 MED ORDER — SODIUM CHLORIDE 0.9 % IV SOLN
99.3600 mg | Freq: Once | INTRAVENOUS | Status: AC
  Administered 2013-02-01: 100 mg via INTRAVENOUS
  Filled 2013-02-01: qty 10

## 2013-02-01 MED ORDER — SODIUM CHLORIDE 0.9 % IV SOLN
64.0000 mg/m2 | Freq: Once | INTRAVENOUS | Status: AC
  Administered 2013-02-01: 102 mg via INTRAVENOUS
  Filled 2013-02-01: qty 17

## 2013-02-01 MED ORDER — SODIUM CHLORIDE 0.9 % IV SOLN
Freq: Once | INTRAVENOUS | Status: AC
  Administered 2013-02-01: 14:00:00 via INTRAVENOUS

## 2013-02-01 MED ORDER — DEXAMETHASONE SODIUM PHOSPHATE 20 MG/5ML IJ SOLN
INTRAMUSCULAR | Status: AC
  Filled 2013-02-01: qty 5

## 2013-02-01 MED ORDER — DIPHENHYDRAMINE HCL 50 MG/ML IJ SOLN
50.0000 mg | Freq: Once | INTRAMUSCULAR | Status: AC
  Administered 2013-02-01: 50 mg via INTRAVENOUS

## 2013-02-01 MED ORDER — HEPARIN SOD (PORK) LOCK FLUSH 100 UNIT/ML IV SOLN
500.0000 [IU] | Freq: Once | INTRAVENOUS | Status: AC | PRN
Start: 2013-02-01 — End: 2013-02-01
  Administered 2013-02-01: 500 [IU]
  Filled 2013-02-01: qty 5

## 2013-02-01 MED ORDER — DEXAMETHASONE SODIUM PHOSPHATE 20 MG/5ML IJ SOLN
20.0000 mg | Freq: Once | INTRAMUSCULAR | Status: AC
  Administered 2013-02-01: 20 mg via INTRAVENOUS

## 2013-02-01 MED ORDER — SODIUM CHLORIDE 0.9 % IJ SOLN
10.0000 mL | INTRAMUSCULAR | Status: DC | PRN
  Administered 2013-02-01: 10 mL
  Filled 2013-02-01: qty 10

## 2013-02-01 MED ORDER — ONDANSETRON 16 MG/50ML IVPB (CHCC)
16.0000 mg | Freq: Once | INTRAVENOUS | Status: AC
  Administered 2013-02-01: 16 mg via INTRAVENOUS

## 2013-02-01 MED ORDER — ONDANSETRON 16 MG/50ML IVPB (CHCC)
INTRAVENOUS | Status: AC
  Filled 2013-02-01: qty 16

## 2013-02-01 MED ORDER — FAMOTIDINE IN NACL 20-0.9 MG/50ML-% IV SOLN
20.0000 mg | Freq: Once | INTRAVENOUS | Status: AC
  Administered 2013-02-01: 20 mg via INTRAVENOUS

## 2013-02-01 NOTE — Patient Instructions (Signed)
[facility] Discharge Instructions for Patients Receiving Chemotherapy  Today you received the following chemotherapy agents: Taxol and Carboplatin.  To help prevent nausea and vomiting after your treatment, we encourage you to take your nausea medication as prescribed.   If you develop nausea and vomiting that is not controlled by your nausea medication, call the clinic.   BELOW ARE SYMPTOMS THAT SHOULD BE REPORTED IMMEDIATELY:  *FEVER GREATER THAN 100.5 F  *CHILLS WITH OR WITHOUT FEVER  NAUSEA AND VOMITING THAT IS NOT CONTROLLED WITH YOUR NAUSEA MEDICATION  *UNUSUAL SHORTNESS OF BREATH  *UNUSUAL BRUISING OR BLEEDING  TENDERNESS IN MOUTH AND THROAT WITH OR WITHOUT PRESENCE OF ULCERS  *URINARY PROBLEMS  *BOWEL PROBLEMS  UNUSUAL RASH Items with * indicate a potential emergency and should be followed up as soon as possible.  Feel free to call the clinic you have any questions or concerns. The clinic phone number is [phone].    

## 2013-02-01 NOTE — Progress Notes (Signed)
Per Dr.Gorsuch, OK to treat today Taxol/Carbo with Hgb 8.1 patient to have labs checked next week with possible transfusion.    pt c/o congestion in her right ear that "sounds like a clock is ticking in my ear." Dr. Alvy Bimler is aware and she has no additional recommendations at this time, she has treated the patient for this previously. MD also aware of oral lesions that are resolving.

## 2013-02-01 NOTE — Progress Notes (Signed)
Patient continues to experience weight loss with weight documented as 110.8 pounds January 7, down from 111.2 pounds.  Patient refuses to use her feeding tube for nutrition support.  She can not taste anything.  This reduces her desire to eat.  She will either drink Ensure Plus or Osmolite 1.5 and is only drinking 2-3 daily.  Estimated nutrition needs: 1500-1650 calories, 70-80 g protein, 1.6 L fluid.  Nutrition diagnosis: Inadequate oral intake related to taste alterations as evidenced by dietary recall with patient only consuming approximately 1000 calories daily.  Intervention: Recommended patient use G-tube for nutrition support.  However, with patient's continued refusal, I educated patient to drink 5 cans of Ensure Plus or Osmolite 1.5 daily, in addition to other liquids and foods as desired.  5 cans of Ensure Plus would provide approximately 1775 calories, and 65 g protein.  Patient verbalizes understanding.  Monitoring, evaluation, goals: Patient will tolerate oral intake to promote weight gain.  Next visit: Wednesday, January 28, during chemotherapy.

## 2013-02-03 ENCOUNTER — Telehealth: Payer: Self-pay | Admitting: *Deleted

## 2013-02-03 NOTE — Telephone Encounter (Signed)
sw pt gv appt for 02/08/13 @ 11:45am. Pt is aware...td

## 2013-02-04 ENCOUNTER — Other Ambulatory Visit: Payer: Self-pay | Admitting: Internal Medicine

## 2013-02-06 ENCOUNTER — Other Ambulatory Visit: Payer: Self-pay | Admitting: *Deleted

## 2013-02-08 ENCOUNTER — Ambulatory Visit (HOSPITAL_BASED_OUTPATIENT_CLINIC_OR_DEPARTMENT_OTHER): Payer: Medicare Other

## 2013-02-08 ENCOUNTER — Other Ambulatory Visit: Payer: Self-pay | Admitting: Hematology and Oncology

## 2013-02-08 ENCOUNTER — Other Ambulatory Visit (HOSPITAL_BASED_OUTPATIENT_CLINIC_OR_DEPARTMENT_OTHER): Payer: Medicare Other

## 2013-02-08 ENCOUNTER — Ambulatory Visit (HOSPITAL_COMMUNITY)
Admission: RE | Admit: 2013-02-08 | Discharge: 2013-02-08 | Disposition: A | Discharge status: Home / Self Care | Payer: Medicare Other | Source: Ambulatory Visit | Attending: Hematology and Oncology | Admitting: Hematology and Oncology

## 2013-02-08 VITALS — BP 132/60 | HR 68 | Temp 97.9°F | Resp 18

## 2013-02-08 DIAGNOSIS — D630 Anemia in neoplastic disease: Secondary | ICD-10-CM

## 2013-02-08 DIAGNOSIS — C760 Malignant neoplasm of head, face and neck: Secondary | ICD-10-CM

## 2013-02-08 DIAGNOSIS — D6481 Anemia due to antineoplastic chemotherapy: Secondary | ICD-10-CM | POA: Insufficient documentation

## 2013-02-08 DIAGNOSIS — C50911 Malignant neoplasm of unspecified site of right female breast: Secondary | ICD-10-CM

## 2013-02-08 DIAGNOSIS — D649 Anemia, unspecified: Secondary | ICD-10-CM

## 2013-02-08 DIAGNOSIS — C7800 Secondary malignant neoplasm of unspecified lung: Secondary | ICD-10-CM

## 2013-02-08 DIAGNOSIS — T451X5A Adverse effect of antineoplastic and immunosuppressive drugs, initial encounter: Secondary | ICD-10-CM | POA: Insufficient documentation

## 2013-02-08 DIAGNOSIS — C030 Malignant neoplasm of upper gum: Secondary | ICD-10-CM

## 2013-02-08 LAB — CBC WITH DIFFERENTIAL/PLATELET
BASO%: 0.3 % (ref 0.0–2.0)
Basophils Absolute: 0 10*3/uL (ref 0.0–0.1)
EOS%: 0.3 % (ref 0.0–7.0)
Eosinophils Absolute: 0 10*3/uL (ref 0.0–0.5)
HCT: 25.8 % — ABNORMAL LOW (ref 34.8–46.6)
HEMOGLOBIN: 7.9 g/dL — AB (ref 11.6–15.9)
LYMPH#: 0.8 10*3/uL — AB (ref 0.9–3.3)
LYMPH%: 24.9 % (ref 14.0–49.7)
MCH: 24.2 pg — AB (ref 25.1–34.0)
MCHC: 30.6 g/dL — ABNORMAL LOW (ref 31.5–36.0)
MCV: 79.1 fL — AB (ref 79.5–101.0)
MONO#: 0.2 10*3/uL (ref 0.1–0.9)
MONO%: 6.1 % (ref 0.0–14.0)
NEUT#: 2.1 10*3/uL (ref 1.5–6.5)
NEUT%: 68.4 % (ref 38.4–76.8)
Platelets: 236 10*3/uL (ref 145–400)
RBC: 3.26 10*6/uL — AB (ref 3.70–5.45)
RDW: 17.9 % — AB (ref 11.2–14.5)
WBC: 3.1 10*3/uL — ABNORMAL LOW (ref 3.9–10.3)

## 2013-02-08 LAB — HOLD TUBE, BLOOD BANK

## 2013-02-08 LAB — COMPREHENSIVE METABOLIC PANEL (CC13)
ALT: 12 U/L (ref 0–55)
AST: 15 U/L (ref 5–34)
Albumin: 3.8 g/dL (ref 3.5–5.0)
Alkaline Phosphatase: 69 U/L (ref 40–150)
Anion Gap: 7 mEq/L (ref 3–11)
BILIRUBIN TOTAL: 0.26 mg/dL (ref 0.20–1.20)
BUN: 24.7 mg/dL (ref 7.0–26.0)
CHLORIDE: 107 meq/L (ref 98–109)
CO2: 25 mEq/L (ref 22–29)
CREATININE: 0.9 mg/dL (ref 0.6–1.1)
Calcium: 9.9 mg/dL (ref 8.4–10.4)
Glucose: 111 mg/dl (ref 70–140)
Potassium: 5.5 mEq/L — ABNORMAL HIGH (ref 3.5–5.1)
Sodium: 139 mEq/L (ref 136–145)
Total Protein: 7.4 g/dL (ref 6.4–8.3)

## 2013-02-08 LAB — PREPARE RBC (CROSSMATCH)

## 2013-02-08 MED ORDER — SODIUM CHLORIDE 0.9 % IV SOLN
250.0000 mL | Freq: Once | INTRAVENOUS | Status: AC
  Administered 2013-02-08: 250 mL via INTRAVENOUS

## 2013-02-08 MED ORDER — HEPARIN SOD (PORK) LOCK FLUSH 100 UNIT/ML IV SOLN
500.0000 [IU] | Freq: Every day | INTRAVENOUS | Status: AC | PRN
  Administered 2013-02-08: 500 [IU]
  Filled 2013-02-08: qty 5

## 2013-02-08 MED ORDER — SODIUM CHLORIDE 0.9 % IJ SOLN
10.0000 mL | INTRAMUSCULAR | Status: AC | PRN
  Administered 2013-02-08: 10 mL
  Filled 2013-02-08: qty 10

## 2013-02-08 NOTE — Patient Instructions (Signed)
Blood Transfusion  A blood transfusion replaces your blood or some of its parts. Blood is replaced when you have lost blood because of surgery, an accident, or for severe blood conditions like anemia. You can donate blood to be used on yourself if you have a planned surgery. If you lose blood during that surgery, your own blood can be given back to you. Any blood given to you is checked to make sure it matches your blood type. Your temperature, blood pressure, and heart rate (vital signs) will be checked often.  GET HELP RIGHT AWAY IF:   You feel sick to your stomach (nauseous) or throw up (vomit).  You have watery poop (diarrhea).  You have shortness of breath or trouble breathing.  You have blood in your pee (urine) or have dark colored pee.  You have chest pain or tightness.  Your eyes or skin turn yellow (jaundice).  You have a temperature by mouth above 102 F (38.9 C), not controlled by medicine.  You start to shake and have chills.  You develop a a red rash (hives) or feel itchy.  You develop lightheadedness or feel confused.  You develop back, joint, or muscle pain.  You do not feel hungry (lost appetite).  You feel tired, restless, or nervous.  You develop belly (abdominal) cramps. Document Released: 04/03/2008 Document Revised: 03/30/2011 Document Reviewed: 04/03/2008 Exit[employer] Patient Information 2014 [employer].  

## 2013-02-09 LAB — TYPE AND SCREEN
ABO/RH(D): A NEG
Antibody Screen: NEGATIVE
Unit division: 0

## 2013-02-14 ENCOUNTER — Other Ambulatory Visit: Payer: Self-pay | Admitting: Hematology and Oncology

## 2013-02-14 DIAGNOSIS — C030 Malignant neoplasm of upper gum: Secondary | ICD-10-CM

## 2013-02-15 ENCOUNTER — Ambulatory Visit (HOSPITAL_BASED_OUTPATIENT_CLINIC_OR_DEPARTMENT_OTHER): Payer: Medicare Other

## 2013-02-15 ENCOUNTER — Other Ambulatory Visit (HOSPITAL_BASED_OUTPATIENT_CLINIC_OR_DEPARTMENT_OTHER): Payer: Medicare Other

## 2013-02-15 ENCOUNTER — Telehealth: Payer: Self-pay | Admitting: Hematology and Oncology

## 2013-02-15 ENCOUNTER — Encounter (INDEPENDENT_AMBULATORY_CARE_PROVIDER_SITE_OTHER): Payer: Self-pay

## 2013-02-15 ENCOUNTER — Encounter: Payer: Self-pay | Admitting: Nutrition

## 2013-02-15 ENCOUNTER — Ambulatory Visit (HOSPITAL_BASED_OUTPATIENT_CLINIC_OR_DEPARTMENT_OTHER): Payer: Medicare Other | Admitting: Hematology and Oncology

## 2013-02-15 VITALS — BP 141/66 | HR 89 | Temp 98.5°F | Resp 18 | Ht 68.0 in | Wt 115.1 lb

## 2013-02-15 DIAGNOSIS — E46 Unspecified protein-calorie malnutrition: Secondary | ICD-10-CM

## 2013-02-15 DIAGNOSIS — D630 Anemia in neoplastic disease: Secondary | ICD-10-CM

## 2013-02-15 DIAGNOSIS — C030 Malignant neoplasm of upper gum: Secondary | ICD-10-CM

## 2013-02-15 DIAGNOSIS — D649 Anemia, unspecified: Secondary | ICD-10-CM

## 2013-02-15 DIAGNOSIS — C7800 Secondary malignant neoplasm of unspecified lung: Secondary | ICD-10-CM

## 2013-02-15 DIAGNOSIS — Z5111 Encounter for antineoplastic chemotherapy: Secondary | ICD-10-CM

## 2013-02-15 DIAGNOSIS — G608 Other hereditary and idiopathic neuropathies: Secondary | ICD-10-CM

## 2013-02-15 DIAGNOSIS — E440 Moderate protein-calorie malnutrition: Secondary | ICD-10-CM

## 2013-02-15 LAB — CBC WITH DIFFERENTIAL/PLATELET
BASO%: 0.4 % (ref 0.0–2.0)
Basophils Absolute: 0 10*3/uL (ref 0.0–0.1)
EOS ABS: 0 10*3/uL (ref 0.0–0.5)
EOS%: 0.2 % (ref 0.0–7.0)
HCT: 30.3 % — ABNORMAL LOW (ref 34.8–46.6)
HGB: 9.5 g/dL — ABNORMAL LOW (ref 11.6–15.9)
LYMPH%: 22.1 % (ref 14.0–49.7)
MCH: 25.1 pg (ref 25.1–34.0)
MCHC: 31.4 g/dL — AB (ref 31.5–36.0)
MCV: 80.2 fL (ref 79.5–101.0)
MONO#: 0.4 10*3/uL (ref 0.1–0.9)
MONO%: 8.5 % (ref 0.0–14.0)
NEUT#: 3.4 10*3/uL (ref 1.5–6.5)
NEUT%: 68.8 % (ref 38.4–76.8)
Platelets: 239 10*3/uL (ref 145–400)
RBC: 3.78 10*6/uL (ref 3.70–5.45)
RDW: 17.3 % — ABNORMAL HIGH (ref 11.2–14.5)
WBC: 4.9 10*3/uL (ref 3.9–10.3)
lymph#: 1.1 10*3/uL (ref 0.9–3.3)

## 2013-02-15 LAB — COMPREHENSIVE METABOLIC PANEL (CC13)
ALBUMIN: 3.8 g/dL (ref 3.5–5.0)
ALK PHOS: 86 U/L (ref 40–150)
ALT: 12 U/L (ref 0–55)
AST: 15 U/L (ref 5–34)
Anion Gap: 9 mEq/L (ref 3–11)
BUN: 31.4 mg/dL — ABNORMAL HIGH (ref 7.0–26.0)
CO2: 25 mEq/L (ref 22–29)
Calcium: 9.8 mg/dL (ref 8.4–10.4)
Chloride: 105 mEq/L (ref 98–109)
Creatinine: 0.8 mg/dL (ref 0.6–1.1)
Glucose: 106 mg/dl (ref 70–140)
POTASSIUM: 5.3 meq/L — AB (ref 3.5–5.1)
SODIUM: 139 meq/L (ref 136–145)
TOTAL PROTEIN: 7.2 g/dL (ref 6.4–8.3)
Total Bilirubin: 0.31 mg/dL (ref 0.20–1.20)

## 2013-02-15 MED ORDER — ONDANSETRON 16 MG/50ML IVPB (CHCC)
16.0000 mg | Freq: Once | INTRAVENOUS | Status: AC
Start: 2013-02-15 — End: 2013-02-15
  Administered 2013-02-15: 16 mg via INTRAVENOUS

## 2013-02-15 MED ORDER — HEPARIN SOD (PORK) LOCK FLUSH 100 UNIT/ML IV SOLN
500.0000 [IU] | Freq: Once | INTRAVENOUS | Status: AC | PRN
  Administered 2013-02-15: 500 [IU]
  Filled 2013-02-15: qty 5

## 2013-02-15 MED ORDER — FAMOTIDINE IN NACL 20-0.9 MG/50ML-% IV SOLN
INTRAVENOUS | Status: AC
  Filled 2013-02-15: qty 50

## 2013-02-15 MED ORDER — DIPHENHYDRAMINE HCL 50 MG/ML IJ SOLN
INTRAMUSCULAR | Status: AC
  Filled 2013-02-15: qty 1

## 2013-02-15 MED ORDER — DIPHENHYDRAMINE HCL 50 MG/ML IJ SOLN
50.0000 mg | Freq: Once | INTRAMUSCULAR | Status: AC
Start: 2013-02-15 — End: 2013-02-15
  Administered 2013-02-15: 50 mg via INTRAVENOUS

## 2013-02-15 MED ORDER — SODIUM CHLORIDE 0.9 % IJ SOLN
10.0000 mL | INTRAMUSCULAR | Status: DC | PRN
  Administered 2013-02-15: 10 mL
  Filled 2013-02-15: qty 10

## 2013-02-15 MED ORDER — SODIUM CHLORIDE 0.9 % IV SOLN
99.3600 mg | Freq: Once | INTRAVENOUS | Status: AC
  Administered 2013-02-15: 100 mg via INTRAVENOUS
  Filled 2013-02-15: qty 10

## 2013-02-15 MED ORDER — DEXAMETHASONE SODIUM PHOSPHATE 20 MG/5ML IJ SOLN
20.0000 mg | Freq: Once | INTRAMUSCULAR | Status: AC
  Administered 2013-02-15: 20 mg via INTRAVENOUS

## 2013-02-15 MED ORDER — DEXAMETHASONE SODIUM PHOSPHATE 20 MG/5ML IJ SOLN
INTRAMUSCULAR | Status: AC
  Filled 2013-02-15: qty 5

## 2013-02-15 MED ORDER — ONDANSETRON 16 MG/50ML IVPB (CHCC)
INTRAVENOUS | Status: AC
  Filled 2013-02-15: qty 16

## 2013-02-15 MED ORDER — SODIUM CHLORIDE 0.9 % IV SOLN
Freq: Once | INTRAVENOUS | Status: AC
Start: 2013-02-15 — End: 2013-02-15
  Administered 2013-02-15: 14:00:00 via INTRAVENOUS

## 2013-02-15 MED ORDER — FAMOTIDINE IN NACL 20-0.9 MG/50ML-% IV SOLN
20.0000 mg | Freq: Once | INTRAVENOUS | Status: AC
  Administered 2013-02-15: 20 mg via INTRAVENOUS

## 2013-02-15 MED ORDER — SODIUM CHLORIDE 0.9 % IV SOLN
64.0000 mg/m2 | Freq: Once | INTRAVENOUS | Status: AC
  Administered 2013-02-15: 102 mg via INTRAVENOUS
  Filled 2013-02-15: qty 17

## 2013-02-15 NOTE — Progress Notes (Signed)
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 The patient underwent right mastectomy and sentinel lymph node biopsy which showed a T1 CN 0M0 ER/PR positive HER-2/neu negative invasive ductal carcinoma. She was treated with adjuvant endocrine therapy with letrozole. 2) In January 2014, she complained of some jaw pain throat pain and an enlarged cervical lymph node. She saw Dr. Erik Obey who did a biopsy of the right maxillary alveolus which came back positive for invasive squamous cell carcinoma. CT scan show possible regional lymph node involvement. She went to [facility] and had surgery.  3) On 03/18/2012 she underwent maxillectomy, right neck dissection, and flap reconstruction at [facility]. Unfortunately she had positive margins as well as regional lymph node involvement. There were presence of lymphovascular invasion and perineural invasion. The size of the tumor was 5 cm in greatest dimension final staging was T4, N1, M0. 4) She returned to [facility] for further evaluation after surgery. The patient has canceled her appointments to See a Medical Oncologist Many Times. Ultimately, she underwent adjuvant radiation therapy only. 5) In September 2014, repeat PET/CT scan showed evidence of metastatic disease to the lungs. She's been referred here for further management. The patient received cycle 1 of chemotherapy last week on 11/09/2012 Her treatment was complicated by severe anemia. She received blood transfusion on 11/19/2012 & 12/21/2012 6) 11/21/2014repeat PET CT scan show positive response to treatment 7) 01/03/2013 each cycle of chemo is  modified to 2 weeks on 1 week off 8) 02/15/13 each cycle of chemotherapy is modified to 1 weeks on, 1 week off with 20% dose adjustment to to leukopenia, anemia, and mucositis  INTERVAL HISTORY: Kristine Morales 80 y.o. female returns for further followup. Her ear pain has resolved. She is able to drink 6 cans of Ensure a day. She received blood transfusion recently with improvement of her energy level. Denies any worsening peripheral neuropathy. She denies any recent fever, chills, night sweats or abnormal weight loss   I have reviewed the past medical history, past surgical history, social history and family history with the patient and they are unchanged from previous note.  ALLERGIES:  has No Known Allergies.  MEDICATIONS:  Current Outpatient Prescriptions  Medication Sig Dispense Refill  . ALPHAGAN P 0.1 % SOLN Place 1 drop into both eyes 2 (two) times daily.       . colchicine 0.6 MG tablet Take 0.6 mg by mouth daily.      Marland Kitchen dexamethasone (DECADRON) 4 MG tablet Take 2 tablets (8 mg total) by mouth 2 (two) times daily with a meal. Take two times a day starting the day after chemotherapy for 3 days.  30 tablet  1  . diphenoxylate-atropine (LOMOTIL) 2.5-0.025 MG per tablet Take 1 tablet by mouth 4 (four) times daily as needed for diarrhea or loose stools.      . dorzolamide-timolol (COSOPT) 22.3-6.8 MG/ML ophthalmic solution Place 1 drop into both eyes 2 (two) times daily.       Marland Kitchen lidocaine-prilocaine (EMLA) cream Apply topically as needed. Apply to [facility] a Cath site at least one hour prior to needle stick  30 g  1  . loperamide (IMODIUM) 1 MG/5ML solution  Take 10 mLs (2 mg total) by mouth 4 (four) times daily as needed for diarrhea or loose stools.  120 mL  5  . LORazepam (ATIVAN) 0.5 MG tablet Take 1 tablet (0.5 mg total) by mouth every 6 (six) hours as needed (nausea and/or vomiting.).  30 tablet  0  . mirtazapine (REMERON SOL-TAB) 15 MG disintegrating tablet APPOINTMENT OVERDUE, 1 by  mouth at bedtime  30 tablet  0  . Nutritional Supplements (FEEDING SUPPLEMENT, OSMOLITE 1.2 CAL,) LIQD Begin 360 mL Osmolite 1.2 QID with 60 mL free water before and after each bolus feeding.  1440 mL  0  . ondansetron (ZOFRAN) 8 MG tablet Take 1 tablet (8 mg total) by mouth every 8 (eight) hours as needed for nausea.  30 tablet  1  . oxyCODONE-acetaminophen (PERCOCET/ROXICET) 5-325 MG per tablet Take 1 tablet by mouth every 8 (eight) hours as needed for severe pain.  30 tablet  0  . prochlorperazine (COMPAZINE) 10 MG tablet Take 1 tablet (10 mg total) by mouth every 6 (six) hours as needed (Nausea or vomiting).  30 tablet  1  . sodium fluoride (FLUORISHIELD) 1.1 % GEL dental gel Place 1 application onto teeth at bedtime.      . traZODone (DESYREL) 50 MG tablet Take 1 tablet (50 mg total) by mouth at bedtime.  30 tablet  1  . triamcinolone (NASACORT AQ) 55 MCG/ACT AERO nasal inhaler Place 2 sprays into the nose daily.  1 Inhaler  12   No current facility-administered medications for this visit.    REVIEW OF SYSTEMS:   Constitutional: Denies fevers, chills or  Eyes: Denies blurriness of vision Ears, nose, mouth, throat, and face: Complain of persistent mucositis on the lips Respiratory: Denies cough, dyspnea or wheezes Cardiovascular: Denies palpitation, chest discomfort or lower extremity swelling Gastrointestinal:  Denies nausea, heartburn or change in bowel habits Skin: Denies abnormal skin rashes Lymphatics: Denies new lymphadenopathy or easy bruising Neurological:Denies numbness, tingling or new weaknesses Behavioral/Psych: Mood is stable, no new changes  All other systems were reviewed with the patient and are negative.  PHYSICAL EXAMINATION: ECOG PERFORMANCE STATUS: 2 - Symptomatic, <50% confined to bed  Filed Vitals:   02/15/13 1219  BP: 141/66  Pulse: 89  Temp: 98.5 F (36.9 C)  Resp: 18   Filed Weights   02/15/13 1219  Weight: 115 lb 1.6 oz (52.209 kg)     GENERAL:alert, no distress and comfortable. She looks thin and cachectic SKIN: skin color, texture, turgor are normal, no rashes or significant lesions EYES: normal, Conjunctiva are pink and non-injected, sclera clear OROPHARYNX:no exudate, evidence of mild mucositis in her buccal mucosa and lips, and tongue normal with no thrush NECK: supple, thyroid normal size, non-tender, without nodularity LYMPH:  no palpable lymphadenopathy in the cervical, axillary or inguinal LUNGS: clear to auscultation and percussion with normal breathing effort HEART: regular rate & rhythm and no murmurs and no lower extremity edema ABDOMEN:abdomen soft, non-tender and normal bowel sounds. Feeding tube site looks okay Musculoskeletal:no cyanosis of digits and no clubbing  NEURO: alert & oriented x 3 with fluent speech, no focal motor/sensory deficits  LABORATORY DATA:  I have reviewed the data as listed    Component Value Date/Time   NA 139 02/08/2013 1134   NA 138 09/07/2012 1950   K 5.5* 02/08/2013 1134   K 3.6 09/07/2012 1950   CL 103 09/07/2012 1950   CL 104 02/09/2012 0919   CO2 25 02/08/2013 1134   CO2  25 09/07/2012 1950   GLUCOSE 111 02/08/2013 1134   GLUCOSE 99 09/07/2012 1950   GLUCOSE 142* 02/09/2012 0919   BUN 24.7 02/08/2013 1134   BUN 25* 09/07/2012 1950   CREATININE 0.9 02/08/2013 1134   CREATININE 1.09 09/07/2012 1950   CALCIUM 9.9 02/08/2013 1134   CALCIUM 9.7 09/07/2012 1950   PROT 7.4 02/08/2013 1134   PROT 7.2 09/07/2012 1950   ALBUMIN 3.8 02/08/2013 1134   ALBUMIN 3.6 09/07/2012 1950   AST 15 02/08/2013 1134   AST 17 09/07/2012 1950   ALT 12 02/08/2013 1134   ALT 13 09/07/2012 1950   ALKPHOS 69 02/08/2013 1134   ALKPHOS 75 09/07/2012 1950   BILITOT 0.26 02/08/2013 1134   BILITOT 0.3 09/07/2012 1950   GFRNONAA 47* 09/07/2012 1950   GFRAA 54* 09/07/2012 1950    No results found for this basename: SPEP,  UPEP,   kappa and lambda light chains    Lab Results  Component Value Date   WBC 4.9  02/15/2013   NEUTROABS 3.4 02/15/2013   HGB 9.5* 02/15/2013   HCT 30.3* 02/15/2013   MCV 80.2 02/15/2013   PLT 239 02/15/2013      Chemistry      Component Value Date/Time   NA 139 02/08/2013 1134   NA 138 09/07/2012 1950   K 5.5* 02/08/2013 1134   K 3.6 09/07/2012 1950   CL 103 09/07/2012 1950   CL 104 02/09/2012 0919   CO2 25 02/08/2013 1134   CO2 25 09/07/2012 1950   BUN 24.7 02/08/2013 1134   BUN 25* 09/07/2012 1950   CREATININE 0.9 02/08/2013 1134   CREATININE 1.09 09/07/2012 1950      Component Value Date/Time   CALCIUM 9.9 02/08/2013 1134   CALCIUM 9.7 09/07/2012 1950   ALKPHOS 69 02/08/2013 1134   ALKPHOS 75 09/07/2012 1950   AST 15 02/08/2013 1134   AST 17 09/07/2012 1950   ALT 12 02/08/2013 1134   ALT 13 09/07/2012 1950   BILITOT 0.26 02/08/2013 1134   BILITOT 0.3 09/07/2012 1950     ASSESSMENT & PLAN:  1 metastatic squamous cell carcinoma of the left maxilla the lungs She tolerated chemotherapy well apart from profound pancytopenia. Recently, I reduced the dose of chemotherapy by 20%. Fortunately, repeat PET CT scan showed positive response to chemotherapy From now on, I recommend further adjustments to her chemotherapy cycle to 1 week on 1 week off. #2 anemia This is likely due to recent treatment. The patient denies recent history of bleeding such as epistaxis, hematuria or hematochezia. She is asymptomatic from the anemia. I will observe for now.  She does not require transfusion now. I will continue the chemotherapy at current dose without dosage adjustment.  If the anemia gets progressive worse in the future, I might have to delay her treatment or adjust the chemotherapy dose. 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 It is only mild, grade 1. We will observe. #4 mild mucositis  Continue conservative management with baking soda mix #5 malnutrition with weight loss I will adjust the chemotherapy based on the most recent weight and continue on a 20% dose  adjustment. I encouraged her to use a feeding tube. I would discuss this with the dietitian later. All questions were answered. The patient knows to call the clinic with any problems, questions or concerns. No barriers to learning was detected.    [facility], [place], MD 02/15/2013 12:53 PM

## 2013-02-15 NOTE — Patient Instructions (Signed)
[facility] Discharge Instructions for Patients Receiving Chemotherapy  Today you received the following chemotherapy agents: Taxol/Carboplatin  To help prevent nausea and vomiting after your treatment, we encourage you to take your nausea medication as prescribed by your physician.   If you develop nausea and vomiting that is not controlled by your nausea medication, call the clinic.   BELOW ARE SYMPTOMS THAT SHOULD BE REPORTED IMMEDIATELY:  *FEVER GREATER THAN 100.5 F  *CHILLS WITH OR WITHOUT FEVER  NAUSEA AND VOMITING THAT IS NOT CONTROLLED WITH YOUR NAUSEA MEDICATION  *UNUSUAL SHORTNESS OF BREATH  *UNUSUAL BRUISING OR BLEEDING  TENDERNESS IN MOUTH AND THROAT WITH OR WITHOUT PRESENCE OF ULCERS  *URINARY PROBLEMS  *BOWEL PROBLEMS  UNUSUAL RASH Items with * indicate a potential emergency and should be followed up as soon as possible.  Feel free to call the clinic you have any questions or concerns. The clinic phone number is [phone].    

## 2013-02-15 NOTE — Telephone Encounter (Signed)
gv and printed appt sched and avs for pt for Feb...sed adjusted tx.

## 2013-02-22 ENCOUNTER — Ambulatory Visit: Payer: Self-pay

## 2013-02-22 ENCOUNTER — Encounter: Payer: Self-pay | Admitting: Nutrition

## 2013-03-01 ENCOUNTER — Ambulatory Visit (HOSPITAL_BASED_OUTPATIENT_CLINIC_OR_DEPARTMENT_OTHER): Payer: Medicare Other | Admitting: Hematology and Oncology

## 2013-03-01 ENCOUNTER — Ambulatory Visit (HOSPITAL_BASED_OUTPATIENT_CLINIC_OR_DEPARTMENT_OTHER): Payer: Medicare Other

## 2013-03-01 ENCOUNTER — Telehealth: Payer: Self-pay | Admitting: Hematology and Oncology

## 2013-03-01 ENCOUNTER — Encounter (INDEPENDENT_AMBULATORY_CARE_PROVIDER_SITE_OTHER): Payer: Self-pay

## 2013-03-01 ENCOUNTER — Other Ambulatory Visit (HOSPITAL_BASED_OUTPATIENT_CLINIC_OR_DEPARTMENT_OTHER): Payer: Medicare Other

## 2013-03-01 ENCOUNTER — Ambulatory Visit: Payer: Medicare Other | Admitting: Nutrition

## 2013-03-01 VITALS — BP 144/56 | HR 88 | Temp 97.9°F | Resp 18 | Ht 68.0 in | Wt 115.8 lb

## 2013-03-01 DIAGNOSIS — K121 Other forms of stomatitis: Secondary | ICD-10-CM

## 2013-03-01 DIAGNOSIS — C7800 Secondary malignant neoplasm of unspecified lung: Secondary | ICD-10-CM

## 2013-03-01 DIAGNOSIS — C030 Malignant neoplasm of upper gum: Secondary | ICD-10-CM

## 2013-03-01 DIAGNOSIS — D630 Anemia in neoplastic disease: Secondary | ICD-10-CM

## 2013-03-01 DIAGNOSIS — D649 Anemia, unspecified: Secondary | ICD-10-CM

## 2013-03-01 DIAGNOSIS — E46 Unspecified protein-calorie malnutrition: Secondary | ICD-10-CM

## 2013-03-01 DIAGNOSIS — K1230 Oral mucositis (ulcerative), unspecified: Secondary | ICD-10-CM

## 2013-03-01 DIAGNOSIS — R634 Abnormal weight loss: Secondary | ICD-10-CM

## 2013-03-01 DIAGNOSIS — Z5111 Encounter for antineoplastic chemotherapy: Secondary | ICD-10-CM

## 2013-03-01 DIAGNOSIS — G609 Hereditary and idiopathic neuropathy, unspecified: Secondary | ICD-10-CM

## 2013-03-01 LAB — COMPREHENSIVE METABOLIC PANEL (CC13)
ALT: 13 U/L (ref 0–55)
ANION GAP: 7 meq/L (ref 3–11)
AST: 15 U/L (ref 5–34)
Albumin: 3.8 g/dL (ref 3.5–5.0)
Alkaline Phosphatase: 88 U/L (ref 40–150)
BILIRUBIN TOTAL: 0.23 mg/dL (ref 0.20–1.20)
BUN: 25.7 mg/dL (ref 7.0–26.0)
CO2: 26 meq/L (ref 22–29)
CREATININE: 0.9 mg/dL (ref 0.6–1.1)
Calcium: 10.1 mg/dL (ref 8.4–10.4)
Chloride: 103 mEq/L (ref 98–109)
Glucose: 112 mg/dl (ref 70–140)
Potassium: 5.1 mEq/L (ref 3.5–5.1)
Sodium: 137 mEq/L (ref 136–145)
Total Protein: 7.2 g/dL (ref 6.4–8.3)

## 2013-03-01 LAB — CBC WITH DIFFERENTIAL/PLATELET
BASO%: 0.3 % (ref 0.0–2.0)
Basophils Absolute: 0 10*3/uL (ref 0.0–0.1)
EOS%: 0.3 % (ref 0.0–7.0)
Eosinophils Absolute: 0 10*3/uL (ref 0.0–0.5)
HEMATOCRIT: 26.9 % — AB (ref 34.8–46.6)
HEMOGLOBIN: 8.7 g/dL — AB (ref 11.6–15.9)
LYMPH#: 0.7 10*3/uL — AB (ref 0.9–3.3)
LYMPH%: 16.7 % (ref 14.0–49.7)
MCH: 25.5 pg (ref 25.1–34.0)
MCHC: 32.2 g/dL (ref 31.5–36.0)
MCV: 79.3 fL — ABNORMAL LOW (ref 79.5–101.0)
MONO#: 0.4 10*3/uL (ref 0.1–0.9)
MONO%: 8.8 % (ref 0.0–14.0)
NEUT#: 3 10*3/uL (ref 1.5–6.5)
NEUT%: 73.9 % (ref 38.4–76.8)
Platelets: 267 10*3/uL (ref 145–400)
RBC: 3.39 10*6/uL — ABNORMAL LOW (ref 3.70–5.45)
RDW: 17.2 % — ABNORMAL HIGH (ref 11.2–14.5)
WBC: 4 10*3/uL (ref 3.9–10.3)

## 2013-03-01 LAB — HOLD TUBE, BLOOD BANK

## 2013-03-01 MED ORDER — CARBOPLATIN CHEMO INJECTION 450 MG/45ML
99.3600 mg | Freq: Once | INTRAVENOUS | Status: AC
  Administered 2013-03-01: 100 mg via INTRAVENOUS
  Filled 2013-03-01: qty 10

## 2013-03-01 MED ORDER — DIPHENHYDRAMINE HCL 50 MG/ML IJ SOLN
50.0000 mg | Freq: Once | INTRAMUSCULAR | Status: AC
  Administered 2013-03-01: 50 mg via INTRAVENOUS

## 2013-03-01 MED ORDER — DEXAMETHASONE SODIUM PHOSPHATE 20 MG/5ML IJ SOLN
20.0000 mg | Freq: Once | INTRAMUSCULAR | Status: AC
Start: 2013-03-01 — End: 2013-03-01
  Administered 2013-03-01: 20 mg via INTRAVENOUS

## 2013-03-01 MED ORDER — ONDANSETRON 16 MG/50ML IVPB (CHCC)
16.0000 mg | Freq: Once | INTRAVENOUS | Status: AC
  Administered 2013-03-01: 16 mg via INTRAVENOUS

## 2013-03-01 MED ORDER — DEXAMETHASONE SODIUM PHOSPHATE 20 MG/5ML IJ SOLN
INTRAMUSCULAR | Status: AC
  Filled 2013-03-01: qty 5

## 2013-03-01 MED ORDER — FAMOTIDINE IN NACL 20-0.9 MG/50ML-% IV SOLN
20.0000 mg | Freq: Once | INTRAVENOUS | Status: AC
  Administered 2013-03-01: 20 mg via INTRAVENOUS

## 2013-03-01 MED ORDER — HEPARIN SOD (PORK) LOCK FLUSH 100 UNIT/ML IV SOLN
500.0000 [IU] | Freq: Once | INTRAVENOUS | Status: AC | PRN
  Administered 2013-03-01: 500 [IU]
  Filled 2013-03-01: qty 5

## 2013-03-01 MED ORDER — DIPHENHYDRAMINE HCL 50 MG/ML IJ SOLN
INTRAMUSCULAR | Status: AC
  Filled 2013-03-01: qty 1

## 2013-03-01 MED ORDER — SODIUM CHLORIDE 0.9 % IV SOLN
Freq: Once | INTRAVENOUS | Status: AC
  Administered 2013-03-01: 14:00:00 via INTRAVENOUS

## 2013-03-01 MED ORDER — ONDANSETRON 16 MG/50ML IVPB (CHCC)
INTRAVENOUS | Status: AC
  Filled 2013-03-01: qty 16

## 2013-03-01 MED ORDER — SODIUM CHLORIDE 0.9 % IV SOLN
64.0000 mg/m2 | Freq: Once | INTRAVENOUS | Status: AC
  Administered 2013-03-01: 102 mg via INTRAVENOUS
  Filled 2013-03-01: qty 17

## 2013-03-01 MED ORDER — FAMOTIDINE IN NACL 20-0.9 MG/50ML-% IV SOLN
INTRAVENOUS | Status: AC
  Filled 2013-03-01: qty 50

## 2013-03-01 MED ORDER — SODIUM CHLORIDE 0.9 % IJ SOLN
10.0000 mL | INTRAMUSCULAR | Status: DC | PRN
  Administered 2013-03-01: 10 mL
  Filled 2013-03-01: qty 10

## 2013-03-01 NOTE — Progress Notes (Signed)
Patient reports that she has tried to increase Ensure Plus or Osmolite 1.5.  She consumed 8 cans one day and vomited.  The next day, she tried to consume 6 cans and vomited.  Patient reports that she was drinking one can while giving one can in the tube at the same time.  Weight has increased to 115.1 pounds on January 28.  This is increased from 110.8 pounds January 7.  Nutrition diagnosis: Inadequate oral intake improved.  Intervention: Recommended patient drink 5 cans of Ensure Plus or Osmolite 1.5 daily.  Specifically, one every 3 hours.  Explained to patient that anything she puts in her feeding tube and anything she takes by mouth is going to the same place.  She may be consuming too much too quickly.  5 cans of Ensure Plus daily provides approximately 1775 calories, 65 g protein.  Monitoring, evaluation, goals: Patient will tolerate 5 cans of Ensure Plus or Osmolite 1.5 daily to meet greater than 90% of estimated nutrition needs.  Next visit: Followup.  Is not scheduled at this time.  I will continue to work with patient as needed.

## 2013-03-01 NOTE — Progress Notes (Signed)
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 The patient underwent right mastectomy and sentinel lymph node biopsy which showed a T1 CN 0M0 ER/PR positive HER-2/neu negative invasive ductal carcinoma. She was treated with adjuvant endocrine therapy with letrozole. 2) In January 2014, she complained of some jaw pain throat pain and an enlarged cervical lymph node. She saw Dr. Erik Morales who did a biopsy of the right maxillary alveolus which came back positive for invasive squamous cell carcinoma. CT scan show possible regional lymph node involvement. She went to [facility] and had surgery.  3) On 03/18/2012 she underwent maxillectomy, right neck dissection, and flap reconstruction at [facility]. Unfortunately she had positive margins as well as regional lymph node involvement. There were presence of lymphovascular invasion and perineural invasion. The size of the tumor was 5 cm in greatest dimension final staging was T4, N1, M0. 4) She returned to [facility] for further evaluation after surgery. The patient has canceled her appointments to See a Medical Oncologist Many Times. Ultimately, she underwent adjuvant radiation therapy only. 5) In September 2014, repeat PET/CT scan showed evidence of metastatic disease to the lungs. She's been referred here for further management. The patient received cycle 1 of chemotherapy last week on 11/09/2012 Her treatment was complicated by severe anemia. She received blood transfusion on 11/19/2012 & 12/21/2012 6) 11/21/2014repeat PET CT scan show positive response to treatment 7) 01/03/2013 each cycle of  chemo is modified to 2 weeks on 1 week off 8) 02/15/13 each cycle of chemotherapy is modified to 1 weeks on, 1 week off with 20% dose adjustment to to leukopenia, anemia, and mucositis  INTERVAL HISTORY: Kristine Morales 80 y.o. female returns for further followup. She still have mild mucositis and dry mouth. She is able to drink and sure to maintain her weight. Denies any peripheral neuropathy.     I have reviewed the past medical history, past surgical history, social history and family history with the patient and they are unchanged from previous note.  ALLERGIES:  has No Known Allergies.  MEDICATIONS:  Current Outpatient Prescriptions  Medication Sig Dispense Refill  . ALPHAGAN P 0.1 % SOLN Place 1 drop into both eyes 2 (two) times daily.       . colchicine 0.6 MG tablet Take 0.6 mg by mouth daily.      Marland Kitchen dexamethasone (DECADRON) 4 MG tablet Take 2 tablets (8 mg total) by mouth 2 (two) times daily with a meal. Take two times a day starting the day after chemotherapy for 3 days.  30 tablet  1  . diphenoxylate-atropine (LOMOTIL) 2.5-0.025 MG per tablet Take 1 tablet by mouth 4 (four) times daily as needed for diarrhea or loose stools.      . dorzolamide-timolol (COSOPT) 22.3-6.8 MG/ML ophthalmic solution Place 1 drop into both eyes 2 (two) times daily.       Marland Kitchen lidocaine-prilocaine (EMLA) cream Apply topically as needed. Apply to [facility] a Cath site at least one hour prior to needle stick  30 g  1  . loperamide (IMODIUM) 1 MG/5ML solution Take 10 mLs (2 mg total) by mouth 4 (four) times daily as needed for diarrhea or loose  stools.  120 mL  5  . LORazepam (ATIVAN) 0.5 MG tablet Take 1 tablet (0.5 mg total) by mouth every 6 (six) hours as needed (nausea and/or vomiting.).  30 tablet  0  . mirtazapine (REMERON SOL-TAB) 15 MG disintegrating tablet APPOINTMENT OVERDUE, 1 by mouth at bedtime  30 tablet  0  . Nutritional Supplements (FEEDING SUPPLEMENT, OSMOLITE 1.2 CAL,) LIQD Begin 360 mL Osmolite 1.2  QID with 60 mL free water before and after each bolus feeding.  1440 mL  0  . ondansetron (ZOFRAN) 8 MG tablet Take 1 tablet (8 mg total) by mouth every 8 (eight) hours as needed for nausea.  30 tablet  1  . oxyCODONE-acetaminophen (PERCOCET/ROXICET) 5-325 MG per tablet Take 1 tablet by mouth every 8 (eight) hours as needed for severe pain.  30 tablet  0  . prochlorperazine (COMPAZINE) 10 MG tablet Take 1 tablet (10 mg total) by mouth every 6 (six) hours as needed (Nausea or vomiting).  30 tablet  1  . sodium fluoride (FLUORISHIELD) 1.1 % GEL dental gel Place 1 application onto teeth at bedtime.      . traZODone (DESYREL) 50 MG tablet Take 1 tablet (50 mg total) by mouth at bedtime.  30 tablet  1  . triamcinolone (NASACORT AQ) 55 MCG/ACT AERO nasal inhaler Place 2 sprays into the nose daily.  1 Inhaler  12   No current facility-administered medications for this visit.    REVIEW OF SYSTEMS:   Constitutional: Denies fevers, chills or abnormal weight loss Eyes: Denies blurriness of vision Respiratory: Denies cough, dyspnea or wheezes Cardiovascular: Denies palpitation, chest discomfort or lower extremity swelling Gastrointestinal:  Denies nausea, heartburn or change in bowel habits Skin: Denies abnormal skin rashes Lymphatics: Denies new lymphadenopathy or easy bruising Neurological:Denies numbness, tingling or new weaknesses Behavioral/Psych: Mood is stable, no new changes  All other systems were reviewed with the patient and are negative.  PHYSICAL EXAMINATION: ECOG PERFORMANCE STATUS: 1 - Symptomatic but completely ambulatory  Filed Vitals:   03/01/13 1247  BP: 144/56  Pulse: 88  Temp: 97.9 F (36.6 C)  Resp: 18   Filed Weights   03/01/13 1247  Weight: 115 lb 12.8 oz (52.527 kg)    GENERAL:alert, no distress and comfortable. She looks thin and cachectic SKIN: skin color, texture, turgor are normal, no rashes or significant lesions EYES: normal, Conjunctiva are pink and  non-injected, sclera clear OROPHARYNX:no exudate, no erythema and lips, tongue normal . Persistent mucositis is seen. Significant deformity from prior surgery NECK: Fibrocystic detected from prior radiation therapy,  LYMPH:  no palpable lymphadenopathy in the cervical, axillary or inguinal LUNGS: clear to auscultation and percussion with normal breathing effort HEART: regular rate & rhythm and no murmurs and no lower extremity edema ABDOMEN:abdomen soft, non-tender and normal bowel sounds. Feeding tube site looks okay Musculoskeletal:no cyanosis of digits and no clubbing  NEURO: alert & oriented x 3 with dysphasia, no focal motor/sensory deficits  LABORATORY DATA:  I have reviewed the data as listed    Component Value Date/Time   NA 137 03/01/2013 1226   NA 138 09/07/2012 1950   K 5.1 03/01/2013 1226   K 3.6 09/07/2012 1950   CL 103 09/07/2012 1950   CL 104 02/09/2012 0919   CO2 26 03/01/2013 1226   CO2 25 09/07/2012 1950   GLUCOSE 112 03/01/2013 1226   GLUCOSE 99 09/07/2012 1950   GLUCOSE 142* 02/09/2012 0919   BUN 25.7 03/01/2013 1226   BUN 25*  09/07/2012 1950   CREATININE 0.9 03/01/2013 1226   CREATININE 1.09 09/07/2012 1950   CALCIUM 10.1 03/01/2013 1226   CALCIUM 9.7 09/07/2012 1950   PROT 7.2 03/01/2013 1226   PROT 7.2 09/07/2012 1950   ALBUMIN 3.8 03/01/2013 1226   ALBUMIN 3.6 09/07/2012 1950   AST 15 03/01/2013 1226   AST 17 09/07/2012 1950   ALT 13 03/01/2013 1226   ALT 13 09/07/2012 1950   ALKPHOS 88 03/01/2013 1226   ALKPHOS 75 09/07/2012 1950   BILITOT 0.23 03/01/2013 1226   BILITOT 0.3 09/07/2012 1950   GFRNONAA 47* 09/07/2012 1950   GFRAA 54* 09/07/2012 1950    No results found for this basename: SPEP,  UPEP,   kappa and lambda light chains    Lab Results  Component Value Date   WBC 4.0 03/01/2013   NEUTROABS 3.0 03/01/2013   HGB 8.7* 03/01/2013   HCT 26.9* 03/01/2013   MCV 79.3* 03/01/2013   PLT 267 03/01/2013      Chemistry      Component Value Date/Time   NA 137 03/01/2013  1226   NA 138 09/07/2012 1950   K 5.1 03/01/2013 1226   K 3.6 09/07/2012 1950   CL 103 09/07/2012 1950   CL 104 02/09/2012 0919   CO2 26 03/01/2013 1226   CO2 25 09/07/2012 1950   BUN 25.7 03/01/2013 1226   BUN 25* 09/07/2012 1950   CREATININE 0.9 03/01/2013 1226   CREATININE 1.09 09/07/2012 1950      Component Value Date/Time   CALCIUM 10.1 03/01/2013 1226   CALCIUM 9.7 09/07/2012 1950   ALKPHOS 88 03/01/2013 1226   ALKPHOS 75 09/07/2012 1950   AST 15 03/01/2013 1226   AST 17 09/07/2012 1950   ALT 13 03/01/2013 1226   ALT 13 09/07/2012 1950   BILITOT 0.23 03/01/2013 1226   BILITOT 0.3 09/07/2012 1950     ASSESSMENT & PLAN:  #1 metastatic squamous cell carcinoma of the left maxilla the lungs She tolerated chemotherapy well apart from profound pancytopenia. Recently, I reduced the dose of chemotherapy by 20%. Fortunately, repeat PET CT scan showed positive response to chemotherapy From now on, I recommend further adjustments to her chemotherapy cycle to 1 week on 1 week off I plan to recheck another PET/CT scan in 2 weeks and he the PET CT scan show no further improvement, I might give her a chemotherapy holiday. #2 anemia This is likely due to recent treatment. The patient denies recent history of bleeding such as epistaxis, hematuria or hematochezia. She is asymptomatic from the anemia. I will observe for now.  She does not require transfusion now. I will continue the chemotherapy at current dose without dosage adjustment.  If the anemia gets progressive worse in the future, I might have to delay her treatment or adjust the chemotherapy dose. 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 It is only mild, grade 1. We will observe. #4 mild mucositis  Continue conservative management with baking soda mix #5 malnutrition with weight loss I will adjust the chemotherapy based on the most recent weight and continue on a 20% dose adjustment. I encouraged her to use a feeding tube. I have  discussed this with the dietitian.  Orders Placed This Encounter  Procedures  . NM PET Image Restag (PS) Skull Base To Thigh    Standing Status: Future     Number of Occurrences:      Standing Expiration Date: 05/01/2014    Order Specific  Question:  Reason for Exam (SYMPTOM  OR DIAGNOSIS REQUIRED)    Answer:  assess response to Rx, metastatic cancer on Rx    Order Specific Question:  Preferred imaging location?    Answer:  [facility]   All questions were answered. The patient knows to call the clinic with any problems, questions or concerns. No barriers to learning was detected. I spent 40 minutes counseling the patient face to face. The total time spent in the appointment was 60 minutes and more than 50% was on counseling and review of test results     [facility], [place], MD 03/01/2013 1:35 PM   .

## 2013-03-01 NOTE — Patient Instructions (Signed)
[facility] Discharge Instructions for Patients Receiving Chemotherapy  Today you received the following chemotherapy agents: Taxol and Carboplatin.  To help prevent nausea and vomiting after your treatment, we encourage you to take your nausea medication as prescribed.   If you develop nausea and vomiting that is not controlled by your nausea medication, call the clinic.   BELOW ARE SYMPTOMS THAT SHOULD BE REPORTED IMMEDIATELY:  *FEVER GREATER THAN 100.5 F  *CHILLS WITH OR WITHOUT FEVER  NAUSEA AND VOMITING THAT IS NOT CONTROLLED WITH YOUR NAUSEA MEDICATION  *UNUSUAL SHORTNESS OF BREATH  *UNUSUAL BRUISING OR BLEEDING  TENDERNESS IN MOUTH AND THROAT WITH OR WITHOUT PRESENCE OF ULCERS  *URINARY PROBLEMS  *BOWEL PROBLEMS  UNUSUAL RASH Items with * indicate a potential emergency and should be followed up as soon as possible.  Feel free to call the clinic you have any questions or concerns. The clinic phone number is [phone].    

## 2013-03-01 NOTE — Telephone Encounter (Signed)
Gave pt appt for lab and Md for February 2015 °

## 2013-03-03 ENCOUNTER — Ambulatory Visit: Admission: RE | Admit: 2013-03-03 | Payer: Medicare Other | Source: Ambulatory Visit

## 2013-03-03 ENCOUNTER — Other Ambulatory Visit: Payer: Self-pay | Admitting: Hematology and Oncology

## 2013-03-03 ENCOUNTER — Ambulatory Visit: Admission: RE | Admit: 2013-03-03 | Payer: Medicare Other | Source: Ambulatory Visit | Admitting: Radiation Oncology

## 2013-03-06 ENCOUNTER — Telehealth: Payer: Self-pay | Admitting: *Deleted

## 2013-03-06 NOTE — Telephone Encounter (Signed)
Called Ms. Scibilia to reschedule missed FU appointment with Dr. Isidore Moos on 03/03/13.  She does not want to reschedule at this time.

## 2013-03-10 ENCOUNTER — Encounter (HOSPITAL_COMMUNITY): Payer: Self-pay

## 2013-03-10 ENCOUNTER — Ambulatory Visit (HOSPITAL_COMMUNITY)
Admission: RE | Admit: 2013-03-10 | Discharge: 2013-03-10 | Disposition: A | Discharge status: Home / Self Care | Payer: Medicare Other | Source: Ambulatory Visit | Attending: Hematology and Oncology | Admitting: Hematology and Oncology

## 2013-03-10 DIAGNOSIS — I700 Atherosclerosis of aorta: Secondary | ICD-10-CM | POA: Insufficient documentation

## 2013-03-10 DIAGNOSIS — C030 Malignant neoplasm of upper gum: Secondary | ICD-10-CM

## 2013-03-10 DIAGNOSIS — C760 Malignant neoplasm of head, face and neck: Secondary | ICD-10-CM | POA: Insufficient documentation

## 2013-03-10 DIAGNOSIS — Z931 Gastrostomy status: Secondary | ICD-10-CM | POA: Insufficient documentation

## 2013-03-10 DIAGNOSIS — R1909 Other intra-abdominal and pelvic swelling, mass and lump: Secondary | ICD-10-CM | POA: Insufficient documentation

## 2013-03-10 DIAGNOSIS — C7800 Secondary malignant neoplasm of unspecified lung: Secondary | ICD-10-CM | POA: Insufficient documentation

## 2013-03-10 LAB — GLUCOSE, CAPILLARY: GLUCOSE-CAPILLARY: 110 mg/dL — AB (ref 70–99)

## 2013-03-10 MED ORDER — FLUDEOXYGLUCOSE F - 18 (FDG) INJECTION
6.6000 | Freq: Once | INTRAVENOUS | Status: AC | PRN
  Administered 2013-03-10: 6.6 via INTRAVENOUS

## 2013-03-15 ENCOUNTER — Telehealth: Payer: Self-pay | Admitting: Hematology and Oncology

## 2013-03-15 ENCOUNTER — Encounter: Payer: Self-pay | Admitting: *Deleted

## 2013-03-15 ENCOUNTER — Ambulatory Visit (HOSPITAL_BASED_OUTPATIENT_CLINIC_OR_DEPARTMENT_OTHER): Payer: Medicare Other | Admitting: Hematology and Oncology

## 2013-03-15 ENCOUNTER — Telehealth: Payer: Self-pay | Admitting: *Deleted

## 2013-03-15 ENCOUNTER — Other Ambulatory Visit (HOSPITAL_BASED_OUTPATIENT_CLINIC_OR_DEPARTMENT_OTHER): Payer: Medicare Other

## 2013-03-15 VITALS — BP 144/68 | HR 85 | Temp 98.3°F | Resp 18 | Ht 68.0 in | Wt 117.6 lb

## 2013-03-15 DIAGNOSIS — C7800 Secondary malignant neoplasm of unspecified lung: Secondary | ICD-10-CM

## 2013-03-15 DIAGNOSIS — C030 Malignant neoplasm of upper gum: Secondary | ICD-10-CM

## 2013-03-15 DIAGNOSIS — D630 Anemia in neoplastic disease: Secondary | ICD-10-CM

## 2013-03-15 LAB — CBC WITH DIFFERENTIAL/PLATELET
BASO%: 0.2 % (ref 0.0–2.0)
Basophils Absolute: 0 10*3/uL (ref 0.0–0.1)
EOS ABS: 0 10*3/uL (ref 0.0–0.5)
EOS%: 0.7 % (ref 0.0–7.0)
HCT: 27.8 % — ABNORMAL LOW (ref 34.8–46.6)
HGB: 8.6 g/dL — ABNORMAL LOW (ref 11.6–15.9)
LYMPH%: 24.3 % (ref 14.0–49.7)
MCH: 24.5 pg — ABNORMAL LOW (ref 25.1–34.0)
MCHC: 30.9 g/dL — ABNORMAL LOW (ref 31.5–36.0)
MCV: 79.2 fL — AB (ref 79.5–101.0)
MONO#: 0.4 10*3/uL (ref 0.1–0.9)
MONO%: 8.4 % (ref 0.0–14.0)
NEUT%: 66.4 % (ref 38.4–76.8)
NEUTROS ABS: 2.9 10*3/uL (ref 1.5–6.5)
PLATELETS: 240 10*3/uL (ref 145–400)
RBC: 3.51 10*6/uL — ABNORMAL LOW (ref 3.70–5.45)
RDW: 16.4 % — ABNORMAL HIGH (ref 11.2–14.5)
WBC: 4.4 10*3/uL (ref 3.9–10.3)
lymph#: 1.1 10*3/uL (ref 0.9–3.3)
nRBC: 0 % (ref 0–0)

## 2013-03-15 LAB — COMPREHENSIVE METABOLIC PANEL (CC13)
ALK PHOS: 92 U/L (ref 40–150)
ALT: 15 U/L (ref 0–55)
AST: 17 U/L (ref 5–34)
Albumin: 3.7 g/dL (ref 3.5–5.0)
Anion Gap: 8 mEq/L (ref 3–11)
BUN: 21.8 mg/dL (ref 7.0–26.0)
CO2: 26 mEq/L (ref 22–29)
Calcium: 9.9 mg/dL (ref 8.4–10.4)
Chloride: 107 mEq/L (ref 98–109)
Creatinine: 1 mg/dL (ref 0.6–1.1)
GLUCOSE: 108 mg/dL (ref 70–140)
POTASSIUM: 4.9 meq/L (ref 3.5–5.1)
SODIUM: 140 meq/L (ref 136–145)
TOTAL PROTEIN: 7.6 g/dL (ref 6.4–8.3)
Total Bilirubin: 0.27 mg/dL (ref 0.20–1.20)

## 2013-03-15 LAB — HOLD TUBE, BLOOD BANK

## 2013-03-15 NOTE — Telephone Encounter (Signed)
gv pt appt schedule for march thru april.

## 2013-03-15 NOTE — Telephone Encounter (Signed)
Per staff message and POF I have scheduled appts.  JMW  

## 2013-03-15 NOTE — Patient Instructions (Signed)
Cisplatin injection What is this medicine? CISPLATIN (SIS pla tin) is a chemotherapy drug. It targets fast dividing cells, like cancer cells, and causes these cells to die. This medicine is used to treat many types of cancer like bladder, ovarian, and testicular cancers. This medicine may be used for other purposes; ask your health care provider or pharmacist if you have questions. COMMON BRAND NAME(S): Platinol -AQ, Platinol What should I tell my health care provider before I take this medicine? They need to know if you have any of these conditions: -blood disorders -hearing problems -kidney disease -recent or ongoing radiation therapy -an unusual or allergic reaction to cisplatin, carboplatin, other chemotherapy, other medicines, foods, dyes, or preservatives -pregnant or trying to get pregnant -breast-feeding How should I use this medicine? This drug is given as an infusion into a vein. It is administered in a hospital or clinic by a specially trained health care professional. Talk to your pediatrician regarding the use of this medicine in children. Special care may be needed. Overdosage: If you think you have taken too much of this medicine contact a poison control center or emergency room at once. NOTE: This medicine is only for you. Do not share this medicine with others. What if I miss a dose? It is important not to miss a dose. Call your doctor or health care professional if you are unable to keep an appointment. What may interact with this medicine? -dofetilide -foscarnet -medicines for seizures -medicines to increase blood counts like filgrastim, pegfilgrastim, sargramostim -probenecid -pyridoxine used with altretamine -rituximab -some antibiotics like amikacin, gentamicin, neomycin, polymyxin B, streptomycin, tobramycin -sulfinpyrazone -vaccines -zalcitabine Talk to your doctor or health care professional before taking any of these  medicines: -acetaminophen -aspirin -ibuprofen -ketoprofen -naproxen This list may not describe all possible interactions. Give your health care provider a list of all the medicines, herbs, non-prescription drugs, or dietary supplements you use. Also tell them if you smoke, drink alcohol, or use illegal drugs. Some items may interact with your medicine. What should I watch for while using this medicine? Your condition will be monitored carefully while you are receiving this medicine. You will need important blood work done while you are taking this medicine. This drug may make you feel generally unwell. This is not uncommon, as chemotherapy can affect healthy cells as well as cancer cells. Report any side effects. Continue your course of treatment even though you feel ill unless your doctor tells you to stop. In some cases, you may be given additional medicines to help with side effects. Follow all directions for their use. Call your doctor or health care professional for advice if you get a fever, chills or sore throat, or other symptoms of a cold or flu. Do not treat yourself. This drug decreases your body's ability to fight infections. Try to avoid being around people who are sick. This medicine may increase your risk to bruise or bleed. Call your doctor or health care professional if you notice any unusual bleeding. Be careful brushing and flossing your teeth or using a toothpick because you may get an infection or bleed more easily. If you have any dental work done, tell your dentist you are receiving this medicine. Avoid taking products that contain aspirin, acetaminophen, ibuprofen, naproxen, or ketoprofen unless instructed by your doctor. These medicines may hide a fever. Do not become pregnant while taking this medicine. Women should inform their doctor if they wish to become pregnant or think they might be pregnant. There is a   potential for serious side effects to an unborn child. Talk to  your health care professional or pharmacist for more information. Do not breast-feed an infant while taking this medicine. Drink fluids as directed while you are taking this medicine. This will help protect your kidneys. Call your doctor or health care professional if you get diarrhea. Do not treat yourself. What side effects may I notice from receiving this medicine? Side effects that you should report to your doctor or health care professional as soon as possible: -allergic reactions like skin rash, itching or hives, swelling of the face, lips, or tongue -signs of infection - fever or chills, cough, sore throat, pain or difficulty passing urine -signs of decreased platelets or bleeding - bruising, pinpoint red spots on the skin, black, tarry stools, nosebleeds -signs of decreased red blood cells - unusually weak or tired, fainting spells, lightheadedness -breathing problems -changes in hearing -gout pain -low blood counts - This drug may decrease the number of white blood cells, red blood cells and platelets. You may be at increased risk for infections and bleeding. -nausea and vomiting -pain, swelling, redness or irritation at the injection site -pain, tingling, numbness in the hands or feet -problems with balance, movement -trouble passing urine or change in the amount of urine Side effects that usually do not require medical attention (report to your doctor or health care professional if they continue or are bothersome): -changes in vision -loss of appetite -metallic taste in the mouth or changes in taste This list may not describe all possible side effects. Call your doctor for medical advice about side effects. You may report side effects to FDA at 1-800-FDA-1088. Where should I keep my medicine? This drug is given in a hospital or clinic and will not be stored at home. NOTE: This sheet is a summary. It may not cover all possible information. If you have questions about this medicine,  talk to your doctor, pharmacist, or health care provider.  2014, [employer] Standard. (2007-04-12 14:40:54)  

## 2013-03-15 NOTE — Progress Notes (Signed)
Met with patient and her husband while they waited for appt with Dr. Alvy Bimler.  Introduced myself at the [employer], explained my role as a member of the Care Team.  They verbalized understanding.  In response to my inquiry, patient stated she is unsure that she needs additional follow-up with Dr. Isidore Moos; she is waiting to discuss future medical needs with Dr. Alvy Bimler.  I indicated I would follow-up with Dr. Alvy Bimler.  Gayleen Orem, RN, BSN, [facility] Head [facility] Neck Oncology Navigator [phone]

## 2013-03-16 MED ORDER — ONDANSETRON HCL 8 MG PO TABS: 8.0000 mg | ORAL_TABLET | Freq: Two times a day (BID) | ORAL | Status: DC | PRN

## 2013-03-16 MED ORDER — PROCHLORPERAZINE MALEATE 10 MG PO TABS: 10.0000 mg | ORAL_TABLET | Freq: Four times a day (QID) | ORAL | Status: DC | PRN

## 2013-03-16 NOTE — Progress Notes (Signed)
[facility] OFFICE PROGRESS NOTE  Patient Care Team: Gayland Curry, DO as PCP - General (Geriatric Medicine) Brooks Sailors, RN as Registered Nurse (Oncology)  DIAGNOSIS: Metastatic squamous cell carcinoma of the head and neck to lungs, for further management  SUMMARY OF ONCOLOGIC HISTORY: 1) Kristine Morales had background history of breast cancer on the right found on diagnostic mammogram in 2011.Kristine Morales is previous patient of Dr.Magrinat. The patient underwent right mastectomy and sentinel lymph node biopsy which showed a T1 CN 0M0 ER/PR positive Kristine Morales-2/neu negative invasive ductal carcinoma. Kristine Morales was treated with adjuvant endocrine therapy with letrozole. 2) In January 2014, Kristine Morales complained of some jaw pain throat pain and an enlarged cervical lymph node. Kristine Morales saw Dr. Erik Obey who did a biopsy of the right maxillary alveolus which came back positive for invasive squamous cell carcinoma. CT scan show possible regional lymph node involvement. Kristine Morales went to [facility] and had surgery.  3) On 03/18/2012 Kristine Morales underwent maxillectomy, right neck dissection, and flap reconstruction at [facility]. Unfortunately Kristine Morales had positive margins as well as regional lymph node involvement. There were presence of lymphovascular invasion and perineural invasion. The size of the tumor was 5 cm in greatest dimension final staging was T4, N1, M0. 4) Kristine Morales returned to [facility] for further evaluation after surgery. The patient has canceled Kristine Morales appointments to See a Medical Oncologist Many Times. Ultimately, Kristine Morales underwent adjuvant radiation therapy only. 5) In September 2014, repeat PET/CT scan showed evidence of metastatic disease to the lungs. Kristine Morales's been referred here for further management. The patient received cycle 1 of chemotherapy last week on 11/09/2012 Kristine Morales treatment was complicated by severe anemia. Kristine Morales received blood transfusion on 11/19/2012 & 12/21/2012 6) 11/21/2014repeat PET CT scan show positive  response to treatment 7) 01/03/2013 each cycle of chemo is modified to 2 weeks on 1 week off 8) 02/15/13 each cycle of chemotherapy is modified to 1 weeks on, 1 week off with 20% dose adjustment to to leukopenia, anemia, and mucositis 9) 03/10/2013 PET CT scan showed disease progression. The patient decided to undergo further chemotherapy    INTERVAL HISTORY: Kristine Morales 80 y.o. female returns for  further followup. Kristine Morales still has some mild source and difficulties with eating. Kristine Morales was able to maintain Kristine Morales weight. Kristine Morales had mild, baseline fatigue. The patient denies any recent signs or symptoms of bleeding such as spontaneous epistaxis, hematuria or hematochezia. Denies any worsening peripheral neuropathy. Kristine Morales still has numbness and tingling in tips of fingers and toes   Kristine Morales have reviewed the past medical history, past surgical history, social history and family history with the patient and they are unchanged from previous note.  ALLERGIES:  has No Known Allergies.  MEDICATIONS:  Current Outpatient Prescriptions  Medication Sig Dispense Refill  . ALPHAGAN P 0.1 % SOLN Place 1 drop into both eyes 2 (two) times daily.       . colchicine 0.6 MG tablet Take 0.6 mg by mouth daily.      Marland Kitchen dexamethasone (DECADRON) 4 MG tablet Take 2 tablets (8 mg total) by mouth 2 (two) times daily with a meal. Take two times a day starting the day after chemotherapy for 3 days.  30 tablet  1  . diphenoxylate-atropine (LOMOTIL) 2.5-0.025 MG per tablet Take 1 tablet by mouth 4 (four) times daily as needed for diarrhea or loose stools.      . dorzolamide-timolol (COSOPT) 22.3-6.8 MG/ML ophthalmic solution Place 1 drop into both eyes 2 (two) times daily.       Marland Kitchen  lidocaine-prilocaine (EMLA) cream Apply topically as needed. Apply to [facility] a Cath site at least one hour prior to needle stick  30 g  1  . loperamide (IMODIUM) 1 MG/5ML solution Take 10 mLs (2 mg total) by mouth 4 (four) times daily as needed for diarrhea or loose  stools.  120 mL  5  . LORazepam (ATIVAN) 0.5 MG tablet Take 1 tablet (0.5 mg total) by mouth every 6 (six) hours as needed (nausea and/or vomiting.).  30 tablet  0  . mirtazapine (REMERON SOL-TAB) 15 MG disintegrating tablet APPOINTMENT OVERDUE, 1 by mouth at bedtime  30 tablet  0  . Nutritional Supplements (FEEDING SUPPLEMENT, OSMOLITE 1.2 CAL,) LIQD Begin 360 mL Osmolite 1.2 QID with 60 mL free water before and after each bolus feeding.  1440 mL  0  . ondansetron (ZOFRAN) 8 MG tablet Take 1 tablet (8 mg total) by mouth every 8 (eight) hours as needed for nausea.  30 tablet  1  . oxyCODONE-acetaminophen (PERCOCET/ROXICET) 5-325 MG per tablet Take 1 tablet by mouth every 8 (eight) hours as needed for severe pain.  30 tablet  0  . prochlorperazine (COMPAZINE) 10 MG tablet Take 1 tablet (10 mg total) by mouth every 6 (six) hours as needed (Nausea or vomiting).  30 tablet  1  . sodium fluoride (FLUORISHIELD) 1.1 % GEL dental gel Place 1 application onto teeth at bedtime.      . traZODone (DESYREL) 50 MG tablet Take 1 tablet (50 mg total) by mouth at bedtime.  30 tablet  1  . triamcinolone (NASACORT AQ) 55 MCG/ACT AERO nasal inhaler Place 2 sprays into the nose daily.  1 Inhaler  12   No current facility-administered medications for this visit.    REVIEW OF SYSTEMS:   Constitutional: Denies fevers, chills  Eyes: Denies blurriness of vision Respiratory: Denies cough, dyspnea or wheezes Cardiovascular: Denies palpitation, chest discomfort or lower extremity swelling Gastrointestinal:  Denies nausea, heartburn or change in bowel habits Skin: Denies abnormal skin rashes Lymphatics: Denies new lymphadenopathy or easy bruising Behavioral/Psych: Mood is stable, no new changes  All other systems were reviewed with the patient and are negative.  PHYSICAL EXAMINATION: ECOG PERFORMANCE STATUS: 1 - Symptomatic but completely ambulatory  Filed Vitals:   03/15/13 1215  BP: 144/68  Pulse: 85  Temp: 98.3  F (36.8 C)  Resp: 18   Filed Weights   03/15/13 1215  Weight: 117 lb 9.6 oz (53.343 kg)    GENERAL:alert, no distress and comfortable. Kristine Morales looks thin and cachectic  SKIN: skin color, texture, turgor are normal, no rashes or significant lesions EYES: normal, Conjunctiva are pink and non-injected, sclera clear OROPHARYNX:very dry mouth, evidence of mild mucositis in Kristine Morales lips  Musculoskeletal:no cyanosis of digits and no clubbing  NEURO: alert & oriented x 3 with fluent speech, no focal motor/sensory deficits  LABORATORY DATA:  Kristine Morales have reviewed the data as listed    Component Value Date/Time   NA 140 03/15/2013 1121   NA 138 09/07/2012 1950   K 4.9 03/15/2013 1121   K 3.6 09/07/2012 1950   CL 103 09/07/2012 1950   CL 104 02/09/2012 0919   CO2 26 03/15/2013 1121   CO2 25 09/07/2012 1950   GLUCOSE 108 03/15/2013 1121   GLUCOSE 99 09/07/2012 1950   GLUCOSE 142* 02/09/2012 0919   BUN 21.8 03/15/2013 1121   BUN 25* 09/07/2012 1950   CREATININE 1.0 03/15/2013 1121   CREATININE 1.09 09/07/2012 1950   CALCIUM  9.9 03/15/2013 1121   CALCIUM 9.7 09/07/2012 1950   PROT 7.6 03/15/2013 1121   PROT 7.2 09/07/2012 1950   ALBUMIN 3.7 03/15/2013 1121   ALBUMIN 3.6 09/07/2012 1950   AST 17 03/15/2013 1121   AST 17 09/07/2012 1950   ALT 15 03/15/2013 1121   ALT 13 09/07/2012 1950   ALKPHOS 92 03/15/2013 1121   ALKPHOS 75 09/07/2012 1950   BILITOT 0.27 03/15/2013 1121   BILITOT 0.3 09/07/2012 1950   GFRNONAA 47* 09/07/2012 1950   GFRAA 54* 09/07/2012 1950    No results found for this basename: SPEP,  UPEP,   kappa and lambda light chains    Lab Results  Component Value Date   WBC 4.4 03/15/2013   NEUTROABS 2.9 03/15/2013   HGB 8.6* 03/15/2013   HCT 27.8* 03/15/2013   MCV 79.2* 03/15/2013   PLT 240 03/15/2013      Chemistry      Component Value Date/Time   NA 140 03/15/2013 1121   NA 138 09/07/2012 1950   K 4.9 03/15/2013 1121   K 3.6 09/07/2012 1950   CL 103 09/07/2012 1950   CL 104 02/09/2012 0919   CO2 26  03/15/2013 1121   CO2 25 09/07/2012 1950   BUN 21.8 03/15/2013 1121   BUN 25* 09/07/2012 1950   CREATININE 1.0 03/15/2013 1121   CREATININE 1.09 09/07/2012 1950      Component Value Date/Time   CALCIUM 9.9 03/15/2013 1121   CALCIUM 9.7 09/07/2012 1950   ALKPHOS 92 03/15/2013 1121   ALKPHOS 75 09/07/2012 1950   AST 17 03/15/2013 1121   AST 17 09/07/2012 1950   ALT 15 03/15/2013 1121   ALT 13 09/07/2012 1950   BILITOT 0.27 03/15/2013 1121   BILITOT 0.3 09/07/2012 1950     RADIOGRAPHIC STUDIES: Kristine Morales reviewed the results of Kristine Morales recent PET/CT scan which show disease progression Kristine Morales have personally reviewed the radiological images as listed and agreed with the findings in the report.  ASSESSMENT & PLAN:  #1 metastatic squamous cell carcinoma to the lungs Kristine Morales had a very long discussion with the patient and Kristine Morales Kristine Morales. Over the past 4 months, Kristine Morales had a lot of difficulties tolerating chemotherapy despite multiple dosage adjustment. PET CT scan unfortunately showed disease progression. Kristine Morales told Kristine Morales further chemotherapy will only bring approximate rate of response rate in the region of 20%, and Kristine Morales could very well likely experience worsening side effects due to progressive bone marrow suppression. Kristine Morales discussed with Kristine Morales consideration for palliative care and hospice but the patient declined. Kristine Morales desire to undergo further chemotherapy, knowing that response rates is likely in the region of 20%, with progression free survival measured in several months only.  We discussed the role of chemotherapy. The intent is for palliative.  We discussed some of the risks, benefits, side-effects of carboplatin. Some of the short term side-effects included, though not limited to, including weight loss, life threatening infections, risk of allergic reactions, need for transfusions of blood products, nausea, vomiting, change in bowel habits, loss of hair, admission to hospital for various reasons, and risks of death.   Long term  side-effects are also discussed including risks of permanent damage to nerve function, hearing loss, chronic fatigue, kidney damage with possibility needing hemodialysis, and rare secondary malignancy including bone marrow disorders.  The patient is aware that the response rates discussed earlier is not guaranteed.  After a long discussion, patient made an informed decision to proceed with the prescribed plan of  care  Kristine Morales will schedule Kristine Morales to get first dose cisplatin next week and to be given on a weekly basis. Kristine Morales plan to follow Kristine Morales every other week. #2 anemia This is likely anemia of chronic disease. The patient denies recent history of bleeding such as epistaxis, hematuria or hematochezia. Kristine Morales is asymptomatic from the anemia. We will observe for now.  Kristine Morales does not require transfusion now.  #3 dysphagia and malnutrition Kristine Morales will continue followup with our nutritionist  All questions were answered. The patient knows to call the clinic with any problems, questions or concerns. No barriers to learning was detected.    [place], Geraldine, MD 03/16/2013 7:42 AM

## 2013-03-22 ENCOUNTER — Other Ambulatory Visit: Payer: Self-pay | Admitting: Hematology and Oncology

## 2013-03-22 ENCOUNTER — Ambulatory Visit (HOSPITAL_BASED_OUTPATIENT_CLINIC_OR_DEPARTMENT_OTHER): Payer: Medicare Other

## 2013-03-22 ENCOUNTER — Ambulatory Visit: Payer: Medicare Other | Admitting: Nutrition

## 2013-03-22 ENCOUNTER — Other Ambulatory Visit (HOSPITAL_BASED_OUTPATIENT_CLINIC_OR_DEPARTMENT_OTHER): Payer: Medicare Other

## 2013-03-22 ENCOUNTER — Encounter: Payer: Self-pay | Admitting: *Deleted

## 2013-03-22 VITALS — BP 151/61 | HR 86 | Temp 98.2°F | Resp 20

## 2013-03-22 DIAGNOSIS — C7800 Secondary malignant neoplasm of unspecified lung: Secondary | ICD-10-CM

## 2013-03-22 DIAGNOSIS — D649 Anemia, unspecified: Secondary | ICD-10-CM

## 2013-03-22 DIAGNOSIS — G4700 Insomnia, unspecified: Secondary | ICD-10-CM

## 2013-03-22 DIAGNOSIS — D630 Anemia in neoplastic disease: Secondary | ICD-10-CM

## 2013-03-22 DIAGNOSIS — C030 Malignant neoplasm of upper gum: Secondary | ICD-10-CM

## 2013-03-22 DIAGNOSIS — Z5111 Encounter for antineoplastic chemotherapy: Secondary | ICD-10-CM

## 2013-03-22 LAB — CBC WITH DIFFERENTIAL/PLATELET
BASO%: 0.3 % (ref 0.0–2.0)
Basophils Absolute: 0 10*3/uL (ref 0.0–0.1)
EOS ABS: 0 10*3/uL (ref 0.0–0.5)
EOS%: 0 % (ref 0.0–7.0)
HCT: 26.7 % — ABNORMAL LOW (ref 34.8–46.6)
HEMOGLOBIN: 8.2 g/dL — AB (ref 11.6–15.9)
LYMPH#: 0.9 10*3/uL (ref 0.9–3.3)
LYMPH%: 24.2 % (ref 14.0–49.7)
MCH: 24.3 pg — ABNORMAL LOW (ref 25.1–34.0)
MCHC: 30.7 g/dL — ABNORMAL LOW (ref 31.5–36.0)
MCV: 79.2 fL — AB (ref 79.5–101.0)
MONO#: 0.4 10*3/uL (ref 0.1–0.9)
MONO%: 9.5 % (ref 0.0–14.0)
NEUT#: 2.5 10*3/uL (ref 1.5–6.5)
NEUT%: 66 % (ref 38.4–76.8)
Platelets: 242 10*3/uL (ref 145–400)
RBC: 3.37 10*6/uL — AB (ref 3.70–5.45)
RDW: 16.3 % — AB (ref 11.2–14.5)
WBC: 3.8 10*3/uL — AB (ref 3.9–10.3)

## 2013-03-22 LAB — COMPREHENSIVE METABOLIC PANEL (CC13)
ALBUMIN: 3.6 g/dL (ref 3.5–5.0)
ALT: 13 U/L (ref 0–55)
ANION GAP: 9 meq/L (ref 3–11)
AST: 16 U/L (ref 5–34)
Alkaline Phosphatase: 91 U/L (ref 40–150)
BUN: 28.3 mg/dL — ABNORMAL HIGH (ref 7.0–26.0)
CALCIUM: 9.7 mg/dL (ref 8.4–10.4)
CHLORIDE: 105 meq/L (ref 98–109)
CO2: 25 meq/L (ref 22–29)
CREATININE: 1 mg/dL (ref 0.6–1.1)
GLUCOSE: 137 mg/dL (ref 70–140)
POTASSIUM: 4.5 meq/L (ref 3.5–5.1)
Sodium: 139 mEq/L (ref 136–145)
Total Bilirubin: 0.21 mg/dL (ref 0.20–1.20)
Total Protein: 7.2 g/dL (ref 6.4–8.3)

## 2013-03-22 LAB — HOLD TUBE, BLOOD BANK

## 2013-03-22 MED ORDER — POTASSIUM CHLORIDE 2 MEQ/ML IV SOLN
Freq: Once | INTRAVENOUS | Status: AC
  Administered 2013-03-22: 09:00:00 via INTRAVENOUS
  Filled 2013-03-22: qty 10

## 2013-03-22 MED ORDER — PALONOSETRON HCL INJECTION 0.25 MG/5ML
INTRAVENOUS | Status: AC
  Filled 2013-03-22: qty 5

## 2013-03-22 MED ORDER — DEXAMETHASONE SODIUM PHOSPHATE 20 MG/5ML IJ SOLN
12.0000 mg | Freq: Once | INTRAMUSCULAR | Status: AC
  Administered 2013-03-22: 12 mg via INTRAVENOUS

## 2013-03-22 MED ORDER — SODIUM CHLORIDE 0.9 % IV SOLN
Freq: Once | INTRAVENOUS | Status: AC
  Administered 2013-03-22: 12:00:00 via INTRAVENOUS

## 2013-03-22 MED ORDER — SODIUM CHLORIDE 0.9 % IV SOLN
150.0000 mg | Freq: Once | INTRAVENOUS | Status: AC
  Administered 2013-03-22: 150 mg via INTRAVENOUS
  Filled 2013-03-22: qty 5

## 2013-03-22 MED ORDER — HEPARIN SOD (PORK) LOCK FLUSH 100 UNIT/ML IV SOLN
500.0000 [IU] | Freq: Once | INTRAVENOUS | Status: AC | PRN
  Administered 2013-03-22: 500 [IU]
  Filled 2013-03-22: qty 5

## 2013-03-22 MED ORDER — SODIUM CHLORIDE 0.9 % IJ SOLN
10.0000 mL | INTRAMUSCULAR | Status: DC | PRN
  Administered 2013-03-22: 10 mL
  Filled 2013-03-22: qty 10

## 2013-03-22 MED ORDER — PALONOSETRON HCL INJECTION 0.25 MG/5ML
0.2500 mg | Freq: Once | INTRAVENOUS | Status: AC
  Administered 2013-03-22: 0.25 mg via INTRAVENOUS

## 2013-03-22 MED ORDER — SODIUM CHLORIDE 0.9 % IV SOLN
30.0000 mg/m2 | Freq: Once | INTRAVENOUS | Status: AC
  Administered 2013-03-22: 48 mg via INTRAVENOUS
  Filled 2013-03-22: qty 48

## 2013-03-22 MED ORDER — ZOLPIDEM TARTRATE 5 MG PO TABS: 5.0000 mg | ORAL_TABLET | Freq: Every evening | ORAL | Status: AC | PRN

## 2013-03-22 MED ORDER — DEXAMETHASONE SODIUM PHOSPHATE 20 MG/5ML IJ SOLN
INTRAMUSCULAR | Status: AC
  Filled 2013-03-22: qty 5

## 2013-03-22 NOTE — Patient Instructions (Addendum)
[facility] Discharge Instructions for Patients Receiving Chemotherapy  Today you received the following chemotherapy agent: Cisplatin  To help prevent nausea and vomiting after your treatment, we encourage you to take your nausea medication as prescribed. You received Aloxi and Emend in the infusion room, these are long-acting anti-nausea meds. You may take compazine as needed for nausea every 6 hours beginning now. You may start taking zofran as needed for nausea beginning 03/25/13. Call the clinic if you are too nauseous to take oral meds.    If you develop nausea and vomiting that is not controlled by your nausea medication, call the clinic.   BELOW ARE SYMPTOMS THAT SHOULD BE REPORTED IMMEDIATELY:  *FEVER GREATER THAN 100.5 F  *CHILLS WITH OR WITHOUT FEVER  NAUSEA AND VOMITING THAT IS NOT CONTROLLED WITH YOUR NAUSEA MEDICATION  *UNUSUAL SHORTNESS OF BREATH  *UNUSUAL BRUISING OR BLEEDING  TENDERNESS IN MOUTH AND THROAT WITH OR WITHOUT PRESENCE OF ULCERS  *URINARY PROBLEMS  *BOWEL PROBLEMS  UNUSUAL RASH Items with * indicate a potential emergency and should be followed up as soon as possible.  Feel free to call the clinic you have any questions or concerns. The clinic phone number is [phone].   Cisplatin injection What is this medicine? CISPLATIN (SIS pla tin) is a chemotherapy drug. It targets fast dividing cells, like cancer cells, and causes these cells to die. This medicine is used to treat many types of cancer like bladder, ovarian, and testicular cancers. This medicine may be used for other purposes; ask your health care provider or pharmacist if you have questions. COMMON BRAND NAME(S): Platinol -AQ, Platinol What should I tell my health care provider before I take this medicine? They need to know if you have any of these conditions: -blood disorders -hearing problems -kidney disease -recent or ongoing radiation therapy -an unusual or allergic  reaction to cisplatin, carboplatin, other chemotherapy, other medicines, foods, dyes, or preservatives -pregnant or trying to get pregnant -breast-feeding How should I use this medicine? This drug is given as an infusion into a vein. It is administered in a hospital or clinic by a specially trained health care professional. Talk to your pediatrician regarding the use of this medicine in children. Special care may be needed. Overdosage: If you think you have taken too much of this medicine contact a poison control center or emergency room at once. NOTE: This medicine is only for you. Do not share this medicine with others. What if I miss a dose? It is important not to miss a dose. Call your doctor or health care professional if you are unable to keep an appointment. What may interact with this medicine? -dofetilide -foscarnet -medicines for seizures -medicines to increase blood counts like filgrastim, pegfilgrastim, sargramostim -probenecid -pyridoxine used with altretamine -rituximab -some antibiotics like amikacin, gentamicin, neomycin, polymyxin B, streptomycin, tobramycin -sulfinpyrazone -vaccines -zalcitabine Talk to your doctor or health care professional before taking any of these medicines: -acetaminophen -aspirin -ibuprofen -ketoprofen -naproxen This list may not describe all possible interactions. Give your health care provider a list of all the medicines, herbs, non-prescription drugs, or dietary supplements you use. Also tell them if you smoke, drink alcohol, or use illegal drugs. Some items may interact with your medicine. What should I watch for while using this medicine? Your condition will be monitored carefully while you are receiving this medicine. You will need important blood work done while you are taking this medicine. This drug may make you feel generally unwell. This  is not uncommon, as chemotherapy can affect healthy cells as well as cancer cells. Report any  side effects. Continue your course of treatment even though you feel ill unless your doctor tells you to stop. In some cases, you may be given additional medicines to help with side effects. Follow all directions for their use. Call your doctor or health care professional for advice if you get a fever, chills or sore throat, or other symptoms of a cold or flu. Do not treat yourself. This drug decreases your body's ability to fight infections. Try to avoid being around people who are sick. This medicine may increase your risk to bruise or bleed. Call your doctor or health care professional if you notice any unusual bleeding. Be careful brushing and flossing your teeth or using a toothpick because you may get an infection or bleed more easily. If you have any dental work done, tell your dentist you are receiving this medicine. Avoid taking products that contain aspirin, acetaminophen, ibuprofen, naproxen, or ketoprofen unless instructed by your doctor. These medicines may hide a fever. Do not become pregnant while taking this medicine. Women should inform their doctor if they wish to become pregnant or think they might be pregnant. There is a potential for serious side effects to an unborn child. Talk to your health care professional or pharmacist for more information. Do not breast-feed an infant while taking this medicine. Drink fluids as directed while you are taking this medicine. This will help protect your kidneys. Call your doctor or health care professional if you get diarrhea. Do not treat yourself. What side effects may I notice from receiving this medicine? Side effects that you should report to your doctor or health care professional as soon as possible: -allergic reactions like skin rash, itching or hives, swelling of the face, lips, or tongue -signs of infection - fever or chills, cough, sore throat, pain or difficulty passing urine -signs of decreased platelets or bleeding - bruising,  pinpoint red spots on the skin, black, tarry stools, nosebleeds -signs of decreased red blood cells - unusually weak or tired, fainting spells, lightheadedness -breathing problems -changes in hearing -gout pain -low blood counts - This drug may decrease the number of white blood cells, red blood cells and platelets. You may be at increased risk for infections and bleeding. -nausea and vomiting -pain, swelling, redness or irritation at the injection site -pain, tingling, numbness in the hands or feet -problems with balance, movement -trouble passing urine or change in the amount of urine Side effects that usually do not require medical attention (report to your doctor or health care professional if they continue or are bothersome): -changes in vision -loss of appetite -metallic taste in the mouth or changes in taste This list may not describe all possible side effects. Call your doctor for medical advice about side effects. You may report side effects to FDA at 1-800-FDA-1088. Where should I keep my medicine? This drug is given in a hospital or clinic and will not be stored at home. NOTE: This sheet is a summary. It may not cover all possible information. If you have questions about this medicine, talk to your doctor, pharmacist, or health care provider.  2014, [employer]vier/Gold Standard. (2007-04-12 14:40:54)

## 2013-03-22 NOTE — Progress Notes (Signed)
Followup completed with patient in chemotherapy area.  Patient continues to consume Ensure Plus or Osmolite 1.5 by mouth.  She is unable to utilize her feeding tube because her hands are so shaky.  She states she spills it all over when she uses her feeding tube.  She has no trouble with oral intake of Ensure Plus.  She is beginning to eat more food by mouth but in very small amounts, primarily for pleasure.  Weight was documented as 117.6 pounds on February 25 which is increasing from 115.1 pounds January 28.  Nutrition diagnosis: Inadequate oral intake improved.  Intervention: Recommend patient drink 5 cans of Ensure Plus daily along with oral intake as desired to promote weight gain.  Educated patient that we could assist her with providing Tube feedings with some assistive devices if she is unable to consume by mouth.  Teach back method used.  Monitoring, evaluation, goals: Patient will tolerate 5 cans of Ensure Plus daily along with some oral intake to promote weight maintenance or gain.  Next visit: Wednesday, March 18, during chemotherapy.

## 2013-03-22 NOTE — Progress Notes (Signed)
Hgb 8.2 today, patient is asymptomatic of anemia denying fatigue or SOB. Per Dr. Alvy Bimler, [place] to treat with Cisplatin.   After 2 complete hours of pre-hydration, patient voided but "missed" the measuring bowl. Unable to record urine amount. Pt stated "it's the most i've ever peed." This was reported to Dr. Alvy Bimler, Per MD OK to proceed with premeds and chemotherapy. Pt also c/o being unable to fall asleep. Dr. Alvy Bimler wrote rx for Lorrin Mais to be given to patient. Per existing meds, pt to refrain from taking ambien and trazadone together to avoid med interaction. Pt aware of instructions and verbalizes understanding.    Patient voided 400 cc after cisplatin and post-hydration complete. Patient encouraged to increase fluid intake at home and the importance of this was stressed to her and husband. Patient verbalizes understanding.

## 2013-03-23 ENCOUNTER — Telehealth: Payer: Self-pay | Admitting: *Deleted

## 2013-03-23 NOTE — Progress Notes (Signed)
Met with patient in waiting area after her chemo infusion.  She stated that her treatment "went OK".  I reminded her to call me should she have questions/concerns as her tmts continue.  She verbalized understanding.  Continuing navigation as L1 patient (new patient).  Gayleen Orem, RN, BSN, [facility] Head & Neck Oncology Navigator [phone]

## 2013-03-23 NOTE — Telephone Encounter (Signed)
Getting ready to eat some breakfast now-slept well and no nausea. Last BM was 03/22/13-no BM yet. Encouraged her to push fluids and reviewed antiemetics with her.

## 2013-03-29 ENCOUNTER — Other Ambulatory Visit: Payer: Self-pay | Admitting: *Deleted

## 2013-03-29 ENCOUNTER — Telehealth: Payer: Self-pay | Admitting: Hematology and Oncology

## 2013-03-29 ENCOUNTER — Ambulatory Visit (HOSPITAL_BASED_OUTPATIENT_CLINIC_OR_DEPARTMENT_OTHER): Payer: Medicare Other

## 2013-03-29 ENCOUNTER — Encounter: Payer: Self-pay | Admitting: Hematology and Oncology

## 2013-03-29 ENCOUNTER — Other Ambulatory Visit: Payer: Self-pay | Admitting: Hematology and Oncology

## 2013-03-29 ENCOUNTER — Other Ambulatory Visit (HOSPITAL_BASED_OUTPATIENT_CLINIC_OR_DEPARTMENT_OTHER): Payer: Medicare Other

## 2013-03-29 ENCOUNTER — Ambulatory Visit (HOSPITAL_BASED_OUTPATIENT_CLINIC_OR_DEPARTMENT_OTHER): Payer: Medicare Other | Admitting: Hematology and Oncology

## 2013-03-29 ENCOUNTER — Encounter: Payer: Self-pay | Admitting: *Deleted

## 2013-03-29 VITALS — BP 145/66 | HR 89 | Temp 98.1°F | Resp 18 | Ht 68.0 in | Wt 117.7 lb

## 2013-03-29 DIAGNOSIS — Z5111 Encounter for antineoplastic chemotherapy: Secondary | ICD-10-CM

## 2013-03-29 DIAGNOSIS — C030 Malignant neoplasm of upper gum: Secondary | ICD-10-CM

## 2013-03-29 DIAGNOSIS — D649 Anemia, unspecified: Secondary | ICD-10-CM | POA: Diagnosis not present

## 2013-03-29 DIAGNOSIS — T451X5A Adverse effect of antineoplastic and immunosuppressive drugs, initial encounter: Secondary | ICD-10-CM

## 2013-03-29 DIAGNOSIS — C7800 Secondary malignant neoplasm of unspecified lung: Secondary | ICD-10-CM | POA: Diagnosis not present

## 2013-03-29 DIAGNOSIS — D6481 Anemia due to antineoplastic chemotherapy: Secondary | ICD-10-CM

## 2013-03-29 DIAGNOSIS — K121 Other forms of stomatitis: Secondary | ICD-10-CM

## 2013-03-29 DIAGNOSIS — E875 Hyperkalemia: Secondary | ICD-10-CM

## 2013-03-29 DIAGNOSIS — E46 Unspecified protein-calorie malnutrition: Secondary | ICD-10-CM

## 2013-03-29 DIAGNOSIS — D630 Anemia in neoplastic disease: Secondary | ICD-10-CM

## 2013-03-29 DIAGNOSIS — K1230 Oral mucositis (ulcerative), unspecified: Secondary | ICD-10-CM

## 2013-03-29 LAB — CBC WITH DIFFERENTIAL/PLATELET
BASO%: 0.1 % (ref 0.0–2.0)
Basophils Absolute: 0 10*3/uL (ref 0.0–0.1)
EOS%: 0.4 % (ref 0.0–7.0)
Eosinophils Absolute: 0 10*3/uL (ref 0.0–0.5)
HCT: 29.5 % — ABNORMAL LOW (ref 34.8–46.6)
HGB: 9 g/dL — ABNORMAL LOW (ref 11.6–15.9)
LYMPH%: 18.3 % (ref 14.0–49.7)
MCH: 24.1 pg — ABNORMAL LOW (ref 25.1–34.0)
MCHC: 30.5 g/dL — AB (ref 31.5–36.0)
MCV: 78.9 fL — ABNORMAL LOW (ref 79.5–101.0)
MONO#: 0.8 10*3/uL (ref 0.1–0.9)
MONO%: 10.9 % (ref 0.0–14.0)
NEUT#: 5.2 10*3/uL (ref 1.5–6.5)
NEUT%: 70.3 % (ref 38.4–76.8)
PLATELETS: 251 10*3/uL (ref 145–400)
RBC: 3.74 10*6/uL (ref 3.70–5.45)
RDW: 16.2 % — ABNORMAL HIGH (ref 11.2–14.5)
WBC: 7.3 10*3/uL (ref 3.9–10.3)
lymph#: 1.3 10*3/uL (ref 0.9–3.3)
nRBC: 0 % (ref 0–0)

## 2013-03-29 LAB — COMPREHENSIVE METABOLIC PANEL (CC13)
ALBUMIN: 3.6 g/dL (ref 3.5–5.0)
ALK PHOS: 90 U/L (ref 40–150)
ALT: 14 U/L (ref 0–55)
AST: 16 U/L (ref 5–34)
Anion Gap: 8 mEq/L (ref 3–11)
BUN: 24.6 mg/dL (ref 7.0–26.0)
CALCIUM: 9.7 mg/dL (ref 8.4–10.4)
CHLORIDE: 108 meq/L (ref 98–109)
CO2: 21 mEq/L — ABNORMAL LOW (ref 22–29)
Creatinine: 0.9 mg/dL (ref 0.6–1.1)
GLUCOSE: 97 mg/dL (ref 70–140)
SODIUM: 136 meq/L (ref 136–145)
Total Bilirubin: 0.2 mg/dL (ref 0.20–1.20)
Total Protein: 7.3 g/dL (ref 6.4–8.3)

## 2013-03-29 LAB — POTASSIUM (CC13): Potassium: 5.9 mEq/L — ABNORMAL HIGH (ref 3.5–5.1)

## 2013-03-29 LAB — MAGNESIUM (CC13): Magnesium: 2.1 mg/dl (ref 1.5–2.5)

## 2013-03-29 MED ORDER — POTASSIUM CHLORIDE 2 MEQ/ML IV SOLN
Freq: Once | INTRAVENOUS | Status: AC
  Administered 2013-03-29: 10:00:00 via INTRAVENOUS
  Filled 2013-03-29: qty 10

## 2013-03-29 MED ORDER — PALONOSETRON HCL INJECTION 0.25 MG/5ML
INTRAVENOUS | Status: AC
  Filled 2013-03-29: qty 5

## 2013-03-29 MED ORDER — SODIUM CHLORIDE 0.9 % IV SOLN
30.0000 mg/m2 | Freq: Once | INTRAVENOUS | Status: AC
  Administered 2013-03-29: 48 mg via INTRAVENOUS
  Filled 2013-03-29: qty 48

## 2013-03-29 MED ORDER — PALONOSETRON HCL INJECTION 0.25 MG/5ML
0.2500 mg | Freq: Once | INTRAVENOUS | Status: AC
  Administered 2013-03-29: 0.25 mg via INTRAVENOUS

## 2013-03-29 MED ORDER — DEXAMETHASONE SODIUM PHOSPHATE 20 MG/5ML IJ SOLN
INTRAMUSCULAR | Status: AC
  Filled 2013-03-29: qty 5

## 2013-03-29 MED ORDER — SODIUM CHLORIDE 0.9 % IJ SOLN
10.0000 mL | INTRAMUSCULAR | Status: DC | PRN
  Administered 2013-03-29: 10 mL
  Filled 2013-03-29: qty 10

## 2013-03-29 MED ORDER — HEPARIN SOD (PORK) LOCK FLUSH 100 UNIT/ML IV SOLN
500.0000 [IU] | Freq: Once | INTRAVENOUS | Status: AC | PRN
  Administered 2013-03-29: 500 [IU]
  Filled 2013-03-29: qty 5

## 2013-03-29 MED ORDER — SODIUM CHLORIDE 0.9 % IV SOLN
Freq: Once | INTRAVENOUS | Status: AC
  Administered 2013-03-29: 10:00:00 via INTRAVENOUS

## 2013-03-29 MED ORDER — DEXAMETHASONE SODIUM PHOSPHATE 20 MG/5ML IJ SOLN
12.0000 mg | Freq: Once | INTRAMUSCULAR | Status: AC
  Administered 2013-03-29: 12 mg via INTRAVENOUS

## 2013-03-29 MED ORDER — SODIUM POLYSTYRENE SULFONATE 15 GM/60ML PO SUSP: 15.0000 g | Freq: Every day | ORAL | Status: DC

## 2013-03-29 MED ORDER — SODIUM CHLORIDE 0.9 % IV SOLN
150.0000 mg | Freq: Once | INTRAVENOUS | Status: AC
  Administered 2013-03-29: 150 mg via INTRAVENOUS
  Filled 2013-03-29: qty 5

## 2013-03-29 NOTE — Progress Notes (Signed)
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 The patient underwent right mastectomy and sentinel lymph node biopsy which showed a T1 CN 0M0 ER/PR positive HER-2/neu negative invasive ductal carcinoma. She was treated with adjuvant endocrine therapy with letrozole. 2) In January 2014, she complained of some jaw pain throat pain and an enlarged cervical lymph node. She saw Dr. Erik Obey who did a biopsy of the right maxillary alveolus which came back positive for invasive squamous cell carcinoma. CT scan show possible regional lymph node involvement. She went to [facility] and had surgery.  3) On 03/18/2012 she underwent maxillectomy, right neck dissection, and flap reconstruction at [facility]. Unfortunately she had positive margins as well as regional lymph node involvement. There were presence of lymphovascular invasion and perineural invasion. The size of the tumor was 5 cm in greatest dimension final staging was T4, N1, M0. 4) She returned to [facility] for further evaluation after surgery. The patient has canceled her appointments to See a Medical Oncologist Many Times. Ultimately, she underwent adjuvant radiation therapy only. 5) In September 2014, repeat PET/CT scan showed evidence of metastatic disease to the lungs. She's been referred here for further management. The patient received cycle 1 of chemotherapy last week on 11/09/2012 Her treatment was complicated by severe anemia. She received blood transfusion on 11/19/2012 & 12/21/2012 6) 11/21/2014repeat PET CT scan show  positive response to treatment 7) 01/03/2013 each cycle of chemo is modified to 2 weeks on 1 week off 8) 02/15/13 each cycle of chemotherapy is modified to 1 weeks on, 1 week off with 20% dose adjustment to to leukopenia, anemia, and mucositis 9) 03/10/2013 PET CT scan showed disease progression. The patient decided to undergo further chemotherapy   10) 03/22/13: The patient was started on weekly cisplatin at 30 mg per meter square INTERVAL HISTORY: Kristine Morales 80 y.o. female returns for further followup. She is to have difficulties eating and opening her mouth. She is able to maintain her weight. She did not use a feeding tube recently. She still has very mild mucositis but much improved compared to before. Complaint of fatigue. Denies peripheral neuropathy.  I have reviewed the past medical history, past surgical history, social history and family history with the patient and they are unchanged from previous note.  ALLERGIES:  has No Known Allergies.  MEDICATIONS:  Current Outpatient Prescriptions  Medication Sig Dispense Refill  . ALPHAGAN P 0.1 % SOLN Place 1 drop into both eyes 2 (two) times daily.       . dorzolamide-timolol (COSOPT) 22.3-6.8 MG/ML ophthalmic solution Place 1 drop into both eyes 2 (two) times daily.       Marland Kitchen lidocaine-prilocaine (EMLA) cream Apply topically as needed. Apply to [facility] a Cath site at least one hour prior to needle stick  30 g  1  . Nutritional Supplements (FEEDING SUPPLEMENT, OSMOLITE 1.2 CAL,) LIQD Begin 360 mL Osmolite 1.2 QID with 60 mL free water before and after each bolus feeding.  1440 mL  0  . sodium fluoride (FLUORISHIELD) 1.1 % GEL dental gel Place 1 application onto teeth at  bedtime.      Marland Kitchen zolpidem (AMBIEN) 5 MG tablet Take 1 tablet (5 mg total) by mouth at bedtime as needed for sleep.  30 tablet  1  . LORazepam (ATIVAN) 0.5 MG tablet Take 1 tablet (0.5 mg total) by mouth every 6 (six) hours as needed (nausea and/or vomiting.).  30 tablet  0  .  ondansetron (ZOFRAN) 8 MG tablet Take 1 tablet (8 mg total) by mouth 2 (two) times daily as needed. Start on the third day after chemotherapy.  30 tablet  1  . oxyCODONE-acetaminophen (PERCOCET/ROXICET) 5-325 MG per tablet Take 1 tablet by mouth every 8 (eight) hours as needed for severe pain.  30 tablet  0  . prochlorperazine (COMPAZINE) 10 MG tablet Take 1 tablet (10 mg total) by mouth every 6 (six) hours as needed (Nausea or vomiting).  30 tablet  1   No current facility-administered medications for this visit.    REVIEW OF SYSTEMS:   Constitutional: Denies fevers, chills or abnormal weight loss Eyes: Denies blurriness of vision Respiratory: Denies cough, dyspnea or wheezes Cardiovascular: Denies palpitation, chest discomfort or lower extremity swelling Gastrointestinal:  Denies nausea, heartburn or change in bowel habits Skin: Denies abnormal skin rashes Lymphatics: Denies new lymphadenopathy or easy bruising Neurological:Denies numbness, tingling or new weaknesses Behavioral/Psych: Mood is stable, no new changes  All other systems were reviewed with the patient and are negative.  PHYSICAL EXAMINATION: ECOG PERFORMANCE STATUS: 1 - Symptomatic but completely ambulatory  Filed Vitals:   03/29/13 0906  BP: 145/66  Pulse: 89  Temp: 98.1 F (36.7 C)  Resp: 18   Filed Weights   03/29/13 0906  Weight: 117 lb 11.2 oz (53.388 kg)    GENERAL:alert, no distress and comfortable. She appears thin and cachectic SKIN: skin color, texture, turgor are normal, no rashes or significant lesions EYES: normal, Conjunctiva are pink and non-injected, sclera clear OROPHARYNX:no exudate, buccal mucosa, and tongue normal . Very mild mucositis affecting her lips. NECK: Significant surgical scars and radiation induced skin fibrosis. LYMPH:  no palpable lymphadenopathy in the cervical, axillary or inguinal LUNGS: clear to auscultation and percussion with normal breathing effort HEART: regular rate &  rhythm and no murmurs and no lower extremity edema ABDOMEN:abdomen soft, non-tender and normal bowel sounds Musculoskeletal:no cyanosis of digits and no clubbing  NEURO: alert & oriented x 3 with fluent speech, no focal motor/sensory deficits  LABORATORY DATA:  I have reviewed the data as listed    Component Value Date/Time   NA 139 03/22/2013 0813   NA 138 09/07/2012 1950   K 4.5 03/22/2013 0813   K 3.6 09/07/2012 1950   CL 103 09/07/2012 1950   CL 104 02/09/2012 0919   CO2 25 03/22/2013 0813   CO2 25 09/07/2012 1950   GLUCOSE 137 03/22/2013 0813   GLUCOSE 99 09/07/2012 1950   GLUCOSE 142* 02/09/2012 0919   BUN 28.3* 03/22/2013 0813   BUN 25* 09/07/2012 1950   CREATININE 1.0 03/22/2013 0813   CREATININE 1.09 09/07/2012 1950   CALCIUM 9.7 03/22/2013 0813   CALCIUM 9.7 09/07/2012 1950   PROT 7.2 03/22/2013 0813   PROT 7.2 09/07/2012 1950   ALBUMIN 3.6 03/22/2013 0813   ALBUMIN 3.6 09/07/2012 1950   AST 16 03/22/2013 0813   AST 17 09/07/2012 1950   ALT 13 03/22/2013 0813   ALT 13 09/07/2012 1950   ALKPHOS 91 03/22/2013 0813   ALKPHOS 75 09/07/2012 1950   BILITOT 0.21 03/22/2013 0813   BILITOT  0.3 09/07/2012 1950   GFRNONAA 47* 09/07/2012 1950   GFRAA 54* 09/07/2012 1950    No results found for this basename: SPEP,  UPEP,   kappa and lambda light chains    Lab Results  Component Value Date   WBC 7.3 03/29/2013   NEUTROABS 5.2 03/29/2013   HGB 9.0* 03/29/2013   HCT 29.5* 03/29/2013   MCV 78.9* 03/29/2013   PLT 251 03/29/2013      Chemistry      Component Value Date/Time   NA 139 03/22/2013 0813   NA 138 09/07/2012 1950   K 4.5 03/22/2013 0813   K 3.6 09/07/2012 1950   CL 103 09/07/2012 1950   CL 104 02/09/2012 0919   CO2 25 03/22/2013 0813   CO2 25 09/07/2012 1950   BUN 28.3* 03/22/2013 0813   BUN 25* 09/07/2012 1950   CREATININE 1.0 03/22/2013 0813   CREATININE 1.09 09/07/2012 1950      Component Value Date/Time   CALCIUM 9.7 03/22/2013 0813   CALCIUM 9.7 09/07/2012 1950   ALKPHOS 91 03/22/2013 0813   ALKPHOS 75  09/07/2012 1950   AST 16 03/22/2013 0813   AST 17 09/07/2012 1950   ALT 13 03/22/2013 0813   ALT 13 09/07/2012 1950   BILITOT 0.21 03/22/2013 0813   BILITOT 0.3 09/07/2012 1950     ASSESSMENT & PLAN:  #1 metastatic squamous cell carcinoma of the head and neck We will continue weekly cisplatin. She appeared to tolerate that well with no major side effects. My plan would be to continue weekly treatment for minimum of 8 treatment before repeating a scan to assess response to therapy. #2 anemia This is likely anemia of chronic disease. The patient denies recent history of bleeding such as epistaxis, hematuria or hematochezia. She is asymptomatic from the anemia. We will observe for now.  She does not require transfusion now.  #3 malnutrition I recommend she continue to use a feeding tube as needed #4 mild mucositis Continue conservative management.  Orders Placed This Encounter  Procedures  . Magnesium    Standing Status: Standing     Number of Occurrences: 3     Standing Expiration Date: 03/30/2014   All questions were answered. The patient knows to call the clinic with any problems, questions or concerns. No barriers to learning was detected. I spent 25 minutes counseling the patient face to face. The total time spent in the appointment was 30 minutes and more than 50% was on counseling and review of test results     [facility], [place], MD 03/29/2013 9:25 AM

## 2013-03-29 NOTE — Patient Instructions (Addendum)
[place] Discharge Instructions for Patients Receiving Chemotherapy  Today you received the following chemotherapy agents:  Cisplatin  To help prevent nausea and vomiting after your treatment, we encourage you to take your nausea medication as ordered per MD.   If you develop nausea and vomiting that is not controlled by your nausea medication, call the clinic.   BELOW ARE SYMPTOMS THAT SHOULD BE REPORTED IMMEDIATELY:  *FEVER GREATER THAN 100.5 F  *CHILLS WITH OR WITHOUT FEVER  NAUSEA AND VOMITING THAT IS NOT CONTROLLED WITH YOUR NAUSEA MEDICATION  *UNUSUAL SHORTNESS OF BREATH  *UNUSUAL BRUISING OR BLEEDING  TENDERNESS IN MOUTH AND THROAT WITH OR WITHOUT PRESENCE OF ULCERS  *URINARY PROBLEMS  *BOWEL PROBLEMS  UNUSUAL RASH Items with * indicate a potential emergency and should be followed up as soon as possible.  Feel free to call the clinic should you have any questions or concerns. The clinic phone number is [phone].   Your Potassium level is high today.  Dr. Alvy Bimler prescribed Kayexalate to pick up at [facility] today.  Take one dose tonight,  One dose tomorrow and return on Friday for lab appointment to recheck your potassium level.   Kayexalate will cause you to have loose stools or diarrhea,  This is how it works and helps your body get rid of the excess potassium in your blood stream.

## 2013-03-29 NOTE — Progress Notes (Signed)
Met with patient after her scheduled appt with Dr. Alvy Bimler.  She stated tmts are progressing well and that she is not experiencing any SEs at this point.  She did not express and concerns or requests.  Continuing navigation as L1 patient (new patient).  Gayleen Orem, RN, BSN, [facility] Head [facility] Neck Oncology Navigator [phone]

## 2013-03-29 NOTE — Telephone Encounter (Signed)
S/w pt in infusion room.  Explained her potassium level still high and Dr. Alvy Bimler ordered Kayexalate to take tonight, tomorrow and return on Friday for lab to re check.  Warned her about diarrhea w/ kayexalate. Informed it was called into [facility] on [address].   Pt verbalized understanding.

## 2013-03-29 NOTE — Telephone Encounter (Signed)
Gave pt appt for lab,md chemo and [facility] and April 2015

## 2013-03-31 ENCOUNTER — Other Ambulatory Visit: Payer: Self-pay

## 2013-03-31 ENCOUNTER — Telehealth: Payer: Self-pay | Admitting: *Deleted

## 2013-03-31 NOTE — Telephone Encounter (Signed)
Called pt regarding missed lab appt from today.  Pt states she cannot make it due to having diarrhea from the Kayexalate.  Pt states took 3 rd dose this morning and having to go to the bathroom frequently.  Asked pt if she can come in soon today to have her potassium rechecked.  Pt says she cannot make it today she has no transportation.   Informed Dr. Alvy Bimler and she instructs for pt to come on Monday for lab.  Informed pt of lab appt on Monday at 11:15 am and she verbalized understanding.

## 2013-04-03 ENCOUNTER — Telehealth: Payer: Self-pay | Admitting: *Deleted

## 2013-04-03 ENCOUNTER — Other Ambulatory Visit (HOSPITAL_BASED_OUTPATIENT_CLINIC_OR_DEPARTMENT_OTHER): Payer: Medicare Other

## 2013-04-03 DIAGNOSIS — D649 Anemia, unspecified: Secondary | ICD-10-CM

## 2013-04-03 DIAGNOSIS — C030 Malignant neoplasm of upper gum: Secondary | ICD-10-CM

## 2013-04-03 LAB — BASIC METABOLIC PANEL (CC13)
ANION GAP: 9 meq/L (ref 3–11)
BUN: 21.3 mg/dL (ref 7.0–26.0)
CO2: 23 mEq/L (ref 22–29)
CREATININE: 0.8 mg/dL (ref 0.6–1.1)
Calcium: 9.9 mg/dL (ref 8.4–10.4)
Chloride: 108 mEq/L (ref 98–109)
GLUCOSE: 97 mg/dL (ref 70–140)
Potassium: 5.4 mEq/L — ABNORMAL HIGH (ref 3.5–5.1)
Sodium: 139 mEq/L (ref 136–145)

## 2013-04-03 NOTE — Telephone Encounter (Signed)
Message copied by Cathlean Cower on Mon Apr 03, 2013 12:15 PM ------      Message from: [facility], [place]: Mon Apr 03, 2013 11:56 AM      Regarding: Test results       Potassium is slightly better      Will recheck again Wednesday during chemo      ----- Message -----         From: [facility] Interface         Sent: 04/03/2013  11:50 AM           To: Heath Lark, MD                   ------

## 2013-04-03 NOTE — Telephone Encounter (Signed)
Informed pt of potassium level slightly improved and we will recheck it again when she comes in on Wednesday.  She verbalized understanding.

## 2013-04-05 ENCOUNTER — Ambulatory Visit (HOSPITAL_BASED_OUTPATIENT_CLINIC_OR_DEPARTMENT_OTHER): Payer: Medicare Other

## 2013-04-05 ENCOUNTER — Ambulatory Visit: Payer: Medicare Other | Admitting: Nutrition

## 2013-04-05 ENCOUNTER — Other Ambulatory Visit (HOSPITAL_BASED_OUTPATIENT_CLINIC_OR_DEPARTMENT_OTHER): Payer: Medicare Other

## 2013-04-05 VITALS — BP 131/65 | HR 80 | Temp 98.1°F | Resp 18

## 2013-04-05 DIAGNOSIS — C030 Malignant neoplasm of upper gum: Secondary | ICD-10-CM

## 2013-04-05 DIAGNOSIS — C7800 Secondary malignant neoplasm of unspecified lung: Secondary | ICD-10-CM

## 2013-04-05 DIAGNOSIS — Z5111 Encounter for antineoplastic chemotherapy: Secondary | ICD-10-CM

## 2013-04-05 LAB — COMPREHENSIVE METABOLIC PANEL (CC13)
ALBUMIN: 3.6 g/dL (ref 3.5–5.0)
ALK PHOS: 88 U/L (ref 40–150)
ALT: 14 U/L (ref 0–55)
AST: 16 U/L (ref 5–34)
Anion Gap: 8 mEq/L (ref 3–11)
BUN: 19.9 mg/dL (ref 7.0–26.0)
CALCIUM: 9.6 mg/dL (ref 8.4–10.4)
CHLORIDE: 108 meq/L (ref 98–109)
CO2: 21 mEq/L — ABNORMAL LOW (ref 22–29)
Creatinine: 0.8 mg/dL (ref 0.6–1.1)
GLUCOSE: 118 mg/dL (ref 70–140)
POTASSIUM: 4.9 meq/L (ref 3.5–5.1)
Sodium: 137 mEq/L (ref 136–145)
TOTAL PROTEIN: 7.2 g/dL (ref 6.4–8.3)
Total Bilirubin: 0.23 mg/dL (ref 0.20–1.20)

## 2013-04-05 LAB — CBC WITH DIFFERENTIAL/PLATELET
BASO%: 1.1 % (ref 0.0–2.0)
Basophils Absolute: 0.1 10*3/uL (ref 0.0–0.1)
EOS ABS: 0 10*3/uL (ref 0.0–0.5)
EOS%: 0.1 % (ref 0.0–7.0)
HCT: 26.9 % — ABNORMAL LOW (ref 34.8–46.6)
HGB: 8.3 g/dL — ABNORMAL LOW (ref 11.6–15.9)
LYMPH%: 17.1 % (ref 14.0–49.7)
MCH: 24.1 pg — ABNORMAL LOW (ref 25.1–34.0)
MCHC: 30.9 g/dL — AB (ref 31.5–36.0)
MCV: 78.2 fL — ABNORMAL LOW (ref 79.5–101.0)
MONO#: 0.5 10*3/uL (ref 0.1–0.9)
MONO%: 7.3 % (ref 0.0–14.0)
NEUT%: 74.4 % (ref 38.4–76.8)
NEUTROS ABS: 5.5 10*3/uL (ref 1.5–6.5)
NRBC: 0 % (ref 0–0)
PLATELETS: 199 10*3/uL (ref 145–400)
RBC: 3.44 10*6/uL — AB (ref 3.70–5.45)
RDW: 16.2 % — AB (ref 11.2–14.5)
WBC: 7.4 10*3/uL (ref 3.9–10.3)
lymph#: 1.3 10*3/uL (ref 0.9–3.3)

## 2013-04-05 LAB — MAGNESIUM (CC13): MAGNESIUM: 2 mg/dL (ref 1.5–2.5)

## 2013-04-05 MED ORDER — SODIUM CHLORIDE 0.9 % IJ SOLN
10.0000 mL | INTRAMUSCULAR | Status: DC | PRN
  Administered 2013-04-05: 10 mL
  Filled 2013-04-05: qty 10

## 2013-04-05 MED ORDER — PALONOSETRON HCL INJECTION 0.25 MG/5ML
0.2500 mg | Freq: Once | INTRAVENOUS | Status: AC
  Administered 2013-04-05: 0.25 mg via INTRAVENOUS

## 2013-04-05 MED ORDER — DEXAMETHASONE SODIUM PHOSPHATE 20 MG/5ML IJ SOLN
12.0000 mg | Freq: Once | INTRAMUSCULAR | Status: AC
  Administered 2013-04-05: 12 mg via INTRAVENOUS

## 2013-04-05 MED ORDER — SODIUM CHLORIDE 0.9 % IV SOLN
Freq: Once | INTRAVENOUS | Status: AC
  Administered 2013-04-05: 10:00:00 via INTRAVENOUS

## 2013-04-05 MED ORDER — HEPARIN SOD (PORK) LOCK FLUSH 100 UNIT/ML IV SOLN
500.0000 [IU] | Freq: Once | INTRAVENOUS | Status: AC | PRN
  Administered 2013-04-05: 500 [IU]
  Filled 2013-04-05: qty 5

## 2013-04-05 MED ORDER — SODIUM CHLORIDE 0.9 % IV SOLN
30.0000 mg/m2 | Freq: Once | INTRAVENOUS | Status: AC
  Administered 2013-04-05: 48 mg via INTRAVENOUS
  Filled 2013-04-05: qty 48

## 2013-04-05 MED ORDER — PALONOSETRON HCL INJECTION 0.25 MG/5ML
INTRAVENOUS | Status: AC
  Filled 2013-04-05: qty 5

## 2013-04-05 MED ORDER — SODIUM CHLORIDE 0.9 % IV SOLN
150.0000 mg | Freq: Once | INTRAVENOUS | Status: AC
  Administered 2013-04-05: 150 mg via INTRAVENOUS
  Filled 2013-04-05: qty 5

## 2013-04-05 MED ORDER — POTASSIUM CHLORIDE 2 MEQ/ML IV SOLN
Freq: Once | INTRAVENOUS | Status: AC
  Administered 2013-04-05: 10:00:00 via INTRAVENOUS
  Filled 2013-04-05: qty 10

## 2013-04-05 MED ORDER — DEXAMETHASONE SODIUM PHOSPHATE 20 MG/5ML IJ SOLN
INTRAMUSCULAR | Status: AC
  Filled 2013-04-05: qty 5

## 2013-04-05 NOTE — Progress Notes (Signed)
Patient reports she continues to eat/drink 100% of her needs.  She is not using her feeding tube.  Patient reports she did not feel well over the weekend so did not eat as well.  Weight is documented as 117.7 pounds and is stable.  Labs reviewed and noted potassium and magnesium within normal limits.  Nutrition Diagnosis:  Inadequate oral intake improved.  Intervention:  Patient educated to continue 5 cans of Ensure Plus along with other soft foods as desired. Teach back method used.  Monitoring, Evaluation, Goals:  Patient to continue oral intake with oral nutrition supplements to maintain weight.  Next Visit: Wednesday, April 15 during chemotherapy.

## 2013-04-10 ENCOUNTER — Other Ambulatory Visit: Payer: Self-pay | Admitting: Hematology and Oncology

## 2013-04-12 ENCOUNTER — Encounter: Payer: Self-pay | Admitting: Hematology and Oncology

## 2013-04-12 ENCOUNTER — Ambulatory Visit (HOSPITAL_BASED_OUTPATIENT_CLINIC_OR_DEPARTMENT_OTHER): Payer: Medicare Other | Admitting: Hematology and Oncology

## 2013-04-12 ENCOUNTER — Other Ambulatory Visit (HOSPITAL_BASED_OUTPATIENT_CLINIC_OR_DEPARTMENT_OTHER): Payer: Medicare Other

## 2013-04-12 ENCOUNTER — Ambulatory Visit (HOSPITAL_COMMUNITY)
Admission: RE | Admit: 2013-04-12 | Discharge: 2013-04-12 | Disposition: A | Discharge status: Home / Self Care | Payer: Medicare Other | Source: Ambulatory Visit | Attending: Hematology and Oncology | Admitting: Hematology and Oncology

## 2013-04-12 ENCOUNTER — Telehealth: Payer: Self-pay | Admitting: Hematology and Oncology

## 2013-04-12 ENCOUNTER — Ambulatory Visit (HOSPITAL_BASED_OUTPATIENT_CLINIC_OR_DEPARTMENT_OTHER): Payer: Medicare Other

## 2013-04-12 VITALS — BP 145/59 | HR 66 | Temp 98.5°F | Resp 20

## 2013-04-12 VITALS — BP 129/67 | HR 85 | Temp 98.1°F | Resp 18 | Ht 68.0 in | Wt 118.0 lb

## 2013-04-12 DIAGNOSIS — C7800 Secondary malignant neoplasm of unspecified lung: Secondary | ICD-10-CM

## 2013-04-12 DIAGNOSIS — C030 Malignant neoplasm of upper gum: Secondary | ICD-10-CM

## 2013-04-12 DIAGNOSIS — Z5111 Encounter for antineoplastic chemotherapy: Secondary | ICD-10-CM

## 2013-04-12 DIAGNOSIS — E46 Unspecified protein-calorie malnutrition: Secondary | ICD-10-CM

## 2013-04-12 DIAGNOSIS — K121 Other forms of stomatitis: Secondary | ICD-10-CM | POA: Insufficient documentation

## 2013-04-12 DIAGNOSIS — D630 Anemia in neoplastic disease: Secondary | ICD-10-CM

## 2013-04-12 DIAGNOSIS — T451X5A Adverse effect of antineoplastic and immunosuppressive drugs, initial encounter: Secondary | ICD-10-CM | POA: Insufficient documentation

## 2013-04-12 DIAGNOSIS — D6481 Anemia due to antineoplastic chemotherapy: Secondary | ICD-10-CM | POA: Insufficient documentation

## 2013-04-12 DIAGNOSIS — C760 Malignant neoplasm of head, face and neck: Secondary | ICD-10-CM | POA: Insufficient documentation

## 2013-04-12 DIAGNOSIS — K1230 Oral mucositis (ulcerative), unspecified: Secondary | ICD-10-CM

## 2013-04-12 LAB — CBC WITH DIFFERENTIAL/PLATELET
BASO%: 0.3 % (ref 0.0–2.0)
Basophils Absolute: 0 10*3/uL (ref 0.0–0.1)
EOS%: 0.5 % (ref 0.0–7.0)
Eosinophils Absolute: 0 10*3/uL (ref 0.0–0.5)
HCT: 25.1 % — ABNORMAL LOW (ref 34.8–46.6)
HGB: 7.9 g/dL — ABNORMAL LOW (ref 11.6–15.9)
LYMPH#: 1 10*3/uL (ref 0.9–3.3)
LYMPH%: 16.2 % (ref 14.0–49.7)
MCH: 24.6 pg — ABNORMAL LOW (ref 25.1–34.0)
MCHC: 31.6 g/dL (ref 31.5–36.0)
MCV: 77.7 fL — ABNORMAL LOW (ref 79.5–101.0)
MONO#: 0.4 10*3/uL (ref 0.1–0.9)
MONO%: 6.5 % (ref 0.0–14.0)
NEUT%: 76.5 % (ref 38.4–76.8)
NEUTROS ABS: 4.8 10*3/uL (ref 1.5–6.5)
Platelets: 248 10*3/uL (ref 145–400)
RBC: 3.22 10*6/uL — AB (ref 3.70–5.45)
RDW: 16.7 % — AB (ref 11.2–14.5)
WBC: 6.3 10*3/uL (ref 3.9–10.3)

## 2013-04-12 LAB — COMPREHENSIVE METABOLIC PANEL (CC13)
ALBUMIN: 3.6 g/dL (ref 3.5–5.0)
ALK PHOS: 107 U/L (ref 40–150)
ALT: 11 U/L (ref 0–55)
ANION GAP: 12 meq/L — AB (ref 3–11)
AST: 15 U/L (ref 5–34)
BUN: 17.3 mg/dL (ref 7.0–26.0)
CALCIUM: 9.8 mg/dL (ref 8.4–10.4)
CHLORIDE: 105 meq/L (ref 98–109)
CO2: 19 mEq/L — ABNORMAL LOW (ref 22–29)
Creatinine: 0.9 mg/dL (ref 0.6–1.1)
GLUCOSE: 116 mg/dL (ref 70–140)
Potassium: 5.1 mEq/L (ref 3.5–5.1)
Sodium: 136 mEq/L (ref 136–145)
Total Bilirubin: <0.2 mg/dL (ref 0.20–1.20)
Total Protein: 7.4 g/dL (ref 6.4–8.3)

## 2013-04-12 LAB — MAGNESIUM (CC13): MAGNESIUM: 1.8 mg/dL (ref 1.5–2.5)

## 2013-04-12 MED ORDER — SODIUM CHLORIDE 0.9 % IV SOLN
Freq: Once | INTRAVENOUS | Status: AC
  Administered 2013-04-12: 10:00:00 via INTRAVENOUS

## 2013-04-12 MED ORDER — POTASSIUM CHLORIDE 2 MEQ/ML IV SOLN
Freq: Once | INTRAVENOUS | Status: AC
  Administered 2013-04-12: 10:00:00 via INTRAVENOUS
  Filled 2013-04-12: qty 10

## 2013-04-12 MED ORDER — HEPARIN SOD (PORK) LOCK FLUSH 100 UNIT/ML IV SOLN
500.0000 [IU] | Freq: Once | INTRAVENOUS | Status: AC | PRN
  Administered 2013-04-12: 500 [IU]
  Filled 2013-04-12: qty 5

## 2013-04-12 MED ORDER — PALONOSETRON HCL INJECTION 0.25 MG/5ML
INTRAVENOUS | Status: AC
  Filled 2013-04-12: qty 5

## 2013-04-12 MED ORDER — SODIUM CHLORIDE 0.9 % IV SOLN
30.0000 mg/m2 | Freq: Once | INTRAVENOUS | Status: AC
  Administered 2013-04-12: 48 mg via INTRAVENOUS
  Filled 2013-04-12: qty 48

## 2013-04-12 MED ORDER — DEXAMETHASONE SODIUM PHOSPHATE 20 MG/5ML IJ SOLN
12.0000 mg | Freq: Once | INTRAMUSCULAR | Status: AC
  Administered 2013-04-12: 12 mg via INTRAVENOUS

## 2013-04-12 MED ORDER — SODIUM CHLORIDE 0.9 % IV SOLN
150.0000 mg | Freq: Once | INTRAVENOUS | Status: AC
  Administered 2013-04-12: 150 mg via INTRAVENOUS
  Filled 2013-04-12: qty 5

## 2013-04-12 MED ORDER — SODIUM CHLORIDE 0.9 % IJ SOLN
10.0000 mL | INTRAMUSCULAR | Status: DC | PRN
  Administered 2013-04-12: 10 mL
  Filled 2013-04-12: qty 10

## 2013-04-12 MED ORDER — DEXAMETHASONE SODIUM PHOSPHATE 20 MG/5ML IJ SOLN
INTRAMUSCULAR | Status: AC
  Filled 2013-04-12: qty 5

## 2013-04-12 MED ORDER — PALONOSETRON HCL INJECTION 0.25 MG/5ML
0.2500 mg | Freq: Once | INTRAVENOUS | Status: AC
  Administered 2013-04-12: 0.25 mg via INTRAVENOUS

## 2013-04-12 NOTE — Progress Notes (Signed)
Pt voided 100 cc urine.

## 2013-04-12 NOTE — Progress Notes (Signed)
1500-No need for post hydration after Cisplatin d/t patient has received one unit of PRBC's today per Dr. Alvy Bimler.  Total output pre Cisplatin-450 ml.'s

## 2013-04-12 NOTE — Telephone Encounter (Signed)
gv and printed appt sched and avs forpt for April...s.ed adjusted 4.8.15 tx

## 2013-04-12 NOTE — Progress Notes (Signed)
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 The patient underwent right mastectomy and sentinel lymph node biopsy which showed a T1 CN 0M0 ER/PR positive HER-2/neu negative invasive ductal carcinoma. She was treated with adjuvant endocrine therapy with letrozole. 2) In January 2014, she complained of some jaw pain throat pain and an enlarged cervical lymph node. She saw Dr. Erik Obey who did a biopsy of the right maxillary alveolus which came back positive for invasive squamous cell carcinoma. CT scan show possible regional lymph node involvement. She went to [facility] and had surgery.  3) On 03/18/2012 she underwent maxillectomy, right neck dissection, and flap reconstruction at [facility]. Unfortunately she had positive margins as well as regional lymph node involvement. There were presence of lymphovascular invasion and perineural invasion. The size of the tumor was 5 cm in greatest dimension final staging was T4, N1, M0. 4) She returned to [facility] for further evaluation after surgery. The patient has canceled her appointments to See a Medical Oncologist Many Times. Ultimately, she underwent adjuvant radiation therapy only. 5) In September 2014, repeat PET/CT scan showed evidence of metastatic disease to the lungs. She's been referred here for further management. The patient received cycle 1 of chemotherapy last week on 11/09/2012 Her treatment was complicated by severe anemia. She received blood transfusion on 11/19/2012 & 12/21/2012 6) 11/21/2014repeat PET CT scan show positive  response to treatment 7) 01/03/2013 each cycle of chemo is modified to 2 weeks on 1 week off 8) 02/15/13 each cycle of chemotherapy is modified to 1 weeks on, 1 week off with 20% dose adjustment to to leukopenia, anemia, and mucositis 9) 03/10/2013 PET CT scan showed disease progression. The patient decided to undergo further chemotherapy   10) 03/22/13: The patient was started on weekly cisplatin at 30 mg per meter square  INTERVAL HISTORY: Kristine Morales 80 y.o. female returns for further followup. She complained of profound fatigue. She complained of very mild peripheral neuropathy at the tips of her left hand but not elsewhere. She has very mild mucositis but they are not severe. No nausea or vomiting.  I have reviewed the past medical history, past surgical history, social history and family history with the patient and they are unchanged from previous note.  ALLERGIES:  has No Known Allergies.  MEDICATIONS:  Current Outpatient Prescriptions  Medication Sig Dispense Refill  . lidocaine-prilocaine (EMLA) cream Apply topically as needed. Apply to [facility] a Cath site at least one hour prior to needle stick  30 g  1  . LORazepam (ATIVAN) 0.5 MG tablet Take 1 tablet (0.5 mg total) by mouth every 6 (six) hours as needed (nausea and/or vomiting.).  30 tablet  0  . Nutritional Supplements (FEEDING SUPPLEMENT, OSMOLITE 1.2 CAL,) LIQD Begin 360 mL Osmolite 1.2 QID with 60 mL free water before and after each bolus feeding.  1440 mL  0  . ondansetron (ZOFRAN) 8 MG tablet Take 1 tablet (8 mg total) by mouth 2 (two) times daily as needed. Start on the third day after chemotherapy.  30 tablet  1  . oxyCODONE-acetaminophen (PERCOCET/ROXICET) 5-325 MG per tablet Take 1  tablet by mouth every 8 (eight) hours as needed for severe pain.  30 tablet  0  . prochlorperazine (COMPAZINE) 10 MG tablet Take 1 tablet (10 mg total) by mouth every 6 (six) hours as needed (Nausea or vomiting).  30 tablet  1  . sodium fluoride  (FLUORISHIELD) 1.1 % GEL dental gel Place 1 application onto teeth at bedtime.      . sodium polystyrene (KAYEXALATE) 15 GM/60ML suspension Take 60 mLs (15 g total) by mouth daily.  180 mL  0  . ALPHAGAN P 0.1 % SOLN Place 1 drop into both eyes 2 (two) times daily.       . dorzolamide-timolol (COSOPT) 22.3-6.8 MG/ML ophthalmic solution Place 1 drop into both eyes 2 (two) times daily.       Marland Kitchen zolpidem (AMBIEN) 5 MG tablet Take 1 tablet (5 mg total) by mouth at bedtime as needed for sleep.  30 tablet  1   No current facility-administered medications for this visit.    REVIEW OF SYSTEMS:   Constitutional: Denies fevers, chills or abnormal weight loss Eyes: Denies blurriness of vision Respiratory: Denies cough, dyspnea or wheezes Cardiovascular: Denies palpitation, chest discomfort or lower extremity swelling Gastrointestinal:  Denies nausea, heartburn or change in bowel habits Skin: Denies abnormal skin rashes Neurological:Denies numbness, tingling or new weaknesses Behavioral/Psych: Mood is stable, no new changes  All other systems were reviewed with the patient and are negative.  PHYSICAL EXAMINATION: ECOG PERFORMANCE STATUS: 1 - Symptomatic but completely ambulatory  Filed Vitals:   04/12/13 0831  BP: 129/67  Pulse: 85  Temp: 98.1 F (36.7 C)  Resp: 18   Filed Weights   04/12/13 0831  Weight: 118 lb (53.524 kg)    GENERAL:alert, no distress and comfortable. She appears thin and cachectic SKIN: skin color, texture, turgor are normal, no rashes or significant lesions EYES: normal, Conjunctiva are pink and non-injected, sclera clear OROPHARYNX very dry lips. She had difficulties opening her mouth. No oral thrush.  NECK: Neck is thick with fibrosis from prior surgery and radiation.  LYMPH:  no palpable lymphadenopathy in the cervical, axillary or inguinal LUNGS: clear to auscultation and percussion with normal breathing effort HEART: regular rate & rhythm and no murmurs and no  lower extremity edema ABDOMEN:abdomen soft, non-tender and normal bowel sounds. Feeding tube site looks okay Musculoskeletal:no cyanosis of digits and no clubbing  NEURO: alert & oriented x 3 with mild dysarthria from her prior surgery, no focal motor/sensory deficits  LABORATORY DATA:  I have reviewed the data as listed    Component Value Date/Time   NA 137 04/05/2013 0909   NA 138 09/07/2012 1950   K 4.9 04/05/2013 0909   K 3.6 09/07/2012 1950   CL 103 09/07/2012 1950   CL 104 02/09/2012 0919   CO2 21* 04/05/2013 0909   CO2 25 09/07/2012 1950   GLUCOSE 118 04/05/2013 0909   GLUCOSE 99 09/07/2012 1950   GLUCOSE 142* 02/09/2012 0919   BUN 19.9 04/05/2013 0909   BUN 25* 09/07/2012 1950   CREATININE 0.8 04/05/2013 0909   CREATININE 1.09 09/07/2012 1950   CALCIUM 9.6 04/05/2013 0909   CALCIUM 9.7 09/07/2012 1950   PROT 7.2 04/05/2013 0909   PROT 7.2 09/07/2012 1950   ALBUMIN 3.6 04/05/2013 0909   ALBUMIN 3.6 09/07/2012 1950   AST 16 04/05/2013 0909   AST 17 09/07/2012 1950   ALT 14 04/05/2013 0909   ALT 13 09/07/2012 1950   ALKPHOS 88 04/05/2013 0909  ALKPHOS 75 09/07/2012 1950   BILITOT 0.23 04/05/2013 0909   BILITOT 0.3 09/07/2012 1950   GFRNONAA 47* 09/07/2012 1950   GFRAA 54* 09/07/2012 1950    No results found for this basename: SPEP,  UPEP,   kappa and lambda light chains    Lab Results  Component Value Date   WBC 6.3 04/12/2013   NEUTROABS 4.8 04/12/2013   HGB 7.9* 04/12/2013   HCT 25.1* 04/12/2013   MCV 77.7* 04/12/2013   PLT 248 04/12/2013      Chemistry      Component Value Date/Time   NA 137 04/05/2013 0909   NA 138 09/07/2012 1950   K 4.9 04/05/2013 0909   K 3.6 09/07/2012 1950   CL 103 09/07/2012 1950   CL 104 02/09/2012 0919   CO2 21* 04/05/2013 0909   CO2 25 09/07/2012 1950   BUN 19.9 04/05/2013 0909   BUN 25* 09/07/2012 1950   CREATININE 0.8 04/05/2013 0909   CREATININE 1.09 09/07/2012 1950      Component Value Date/Time   CALCIUM 9.6 04/05/2013 0909   CALCIUM 9.7 09/07/2012 1950    ALKPHOS 88 04/05/2013 0909   ALKPHOS 75 09/07/2012 1950   AST 16 04/05/2013 0909   AST 17 09/07/2012 1950   ALT 14 04/05/2013 0909   ALT 13 09/07/2012 1950   BILITOT 0.23 04/05/2013 0909   BILITOT 0.3 09/07/2012 1950     ASSESSMENT & PLAN:  #1 metastatic squamous cell carcinoma of the head and neck We will continue weekly cisplatin. She appeared to tolerate that well with no major side effects apart from anemia. My plan would be to continue weekly treatment for minimum of 8 treatment before repeating a scan to assess response to therapy. #2 severe anemia This is due to chemotherapy. We discussed some of the risks, benefits, and alternatives of blood transfusions. The patient is symptomatic from anemia and the hemoglobin level is critically low.  Some of the side-effects to be expected including risks of transfusion reactions, chills, infection, syndrome of volume overload and risk of hospitalization from various reasons and the patient is willing to proceed. I discussed with the alternative such as holding treatment and starting her on erythropoietin stimulating agents, citing some of the expected side effects and the patient declined. #3 malnutrition I recommend she continue to use a feeding tube as needed #4 mild mucositis Continue conservative management.   Orders Placed This Encounter  Procedures  . Hold Tube, Blood Bank    Standing Status: Standing     Number of Occurrences: 9     Standing Expiration Date: 04/13/2014   All questions were answered. The patient knows to call the clinic with any problems, questions or concerns. No barriers to learning was detected.    [place], [place], MD 04/12/2013 9:03 AM

## 2013-04-12 NOTE — Patient Instructions (Signed)
[facility] [place] Discharge Instructions for Patients Receiving Chemotherapy  Today you received the following chemotherapy agents; Cisplatin.   To help prevent nausea and vomiting after your treatment, we encourage you to take your nausea medication as directed.    If you develop nausea and vomiting that is not controlled by your nausea medication, call the clinic.   BELOW ARE SYMPTOMS THAT SHOULD BE REPORTED IMMEDIATELY:  *FEVER GREATER THAN 100.5 F  *CHILLS WITH OR WITHOUT FEVER  NAUSEA AND VOMITING THAT IS NOT CONTROLLED WITH YOUR NAUSEA MEDICATION  *UNUSUAL SHORTNESS OF BREATH  *UNUSUAL BRUISING OR BLEEDING  TENDERNESS IN MOUTH AND THROAT WITH OR WITHOUT PRESENCE OF ULCERS  *URINARY PROBLEMS  *BOWEL PROBLEMS  UNUSUAL RASH Items with * indicate a potential emergency and should be followed up as soon as possible.  Feel free to call the clinic you have any questions or concerns. The clinic phone number is [phone].  Blood Transfusion Information WHAT IS A BLOOD TRANSFUSION? A transfusion is the replacement of blood or some of its parts. Blood is made up of multiple cells which provide different functions.  Red blood cells carry oxygen and are used for blood loss replacement.  White blood cells fight against infection.  Platelets control bleeding.  Plasma helps clot blood.  Other blood products are available for specialized needs, such as hemophilia or other clotting disorders. BEFORE THE TRANSFUSION  Who gives blood for transfusions?   You may be able to donate blood to be used at a later date on yourself (autologous donation).  Relatives can be asked to donate blood. This is generally not any safer than if you have received blood from a stranger. The same precautions are taken to ensure safety when a relative's blood is donated.  Healthy volunteers who are fully evaluated to make sure their blood is safe. This is blood bank blood. Transfusion  therapy is the safest it has ever been in the practice of medicine. Before blood is taken from a donor, a complete history is taken to make sure that person has no history of diseases nor engages in risky social behavior (examples are intravenous drug use or sexual activity with multiple partners). The donor's travel history is screened to minimize risk of transmitting infections, such as malaria. The donated blood is tested for signs of infectious diseases, such as HIV and hepatitis. The blood is then tested to be sure it is compatible with you in order to minimize the chance of a transfusion reaction. If you or a relative donates blood, this is often done in anticipation of surgery and is not appropriate for emergency situations. It takes many days to process the donated blood. RISKS AND COMPLICATIONS Although transfusion therapy is very safe and saves many lives, the main dangers of transfusion include:   Getting an infectious disease.  Developing a transfusion reaction. This is an allergic reaction to something in the blood you were given. Every precaution is taken to prevent this. The decision to have a blood transfusion has been considered carefully by your caregiver before blood is given. Blood is not given unless the benefits outweigh the risks. AFTER THE TRANSFUSION  Right after receiving a blood transfusion, you will usually feel much better and more energetic. This is especially true if your red blood cells have gotten low (anemic). The transfusion raises the level of the red blood cells which carry oxygen, and this usually causes an energy increase.  The nurse administering the transfusion will monitor you  carefully for complications. HOME CARE INSTRUCTIONS  No special instructions are needed after a transfusion. You may find your energy is better. Speak with your caregiver about any limitations on activity for underlying diseases you may have. SEEK MEDICAL CARE IF:   Your condition is  not improving after your transfusion.  You develop redness or irritation at the intravenous (IV) site. SEEK IMMEDIATE MEDICAL CARE IF:  Any of the following symptoms occur over the next 12 hours:  Shaking chills.  You have a temperature by mouth above 102 F (38.9 C), not controlled by medicine.  Chest, back, or muscle pain.  People around you feel you are not acting correctly or are confused.  Shortness of breath or difficulty breathing.  Dizziness and fainting.  You get a rash or develop hives.  You have a decrease in urine output.  Your urine turns a dark color or changes to pink, red, or brown. Any of the following symptoms occur over the next 10 days:  You have a temperature by mouth above 102 F (38.9 C), not controlled by medicine.  Shortness of breath.  Weakness after normal activity.  The white part of the eye turns yellow (jaundice).  You have a decrease in the amount of urine or are urinating less often.  Your urine turns a dark color or changes to pink, red, or brown. Document Released: 01/03/2000 Document Revised: 03/30/2011 Document Reviewed: 08/22/2007 [facility] Patient Information 2014 [place].

## 2013-04-13 LAB — TYPE AND SCREEN
ABO/RH(D): A NEG
Antibody Screen: NEGATIVE
UNIT DIVISION: 0

## 2013-04-19 ENCOUNTER — Encounter: Payer: Self-pay | Admitting: Hematology and Oncology

## 2013-04-19 ENCOUNTER — Ambulatory Visit (HOSPITAL_BASED_OUTPATIENT_CLINIC_OR_DEPARTMENT_OTHER): Payer: Medicare Other

## 2013-04-19 ENCOUNTER — Other Ambulatory Visit: Payer: Self-pay | Admitting: Hematology and Oncology

## 2013-04-19 ENCOUNTER — Other Ambulatory Visit (HOSPITAL_BASED_OUTPATIENT_CLINIC_OR_DEPARTMENT_OTHER): Payer: Medicare Other

## 2013-04-19 VITALS — BP 134/55 | HR 81 | Temp 98.4°F | Resp 18

## 2013-04-19 DIAGNOSIS — E875 Hyperkalemia: Secondary | ICD-10-CM

## 2013-04-19 DIAGNOSIS — D630 Anemia in neoplastic disease: Secondary | ICD-10-CM

## 2013-04-19 DIAGNOSIS — C030 Malignant neoplasm of upper gum: Secondary | ICD-10-CM

## 2013-04-19 DIAGNOSIS — Z5111 Encounter for antineoplastic chemotherapy: Secondary | ICD-10-CM

## 2013-04-19 DIAGNOSIS — C7800 Secondary malignant neoplasm of unspecified lung: Secondary | ICD-10-CM

## 2013-04-19 HISTORY — DX: Hyperkalemia: E87.5

## 2013-04-19 LAB — CBC WITH DIFFERENTIAL/PLATELET
BASO%: 0.3 % (ref 0.0–2.0)
Basophils Absolute: 0 10*3/uL (ref 0.0–0.1)
EOS ABS: 0 10*3/uL (ref 0.0–0.5)
EOS%: 0.2 % (ref 0.0–7.0)
HCT: 29.9 % — ABNORMAL LOW (ref 34.8–46.6)
HGB: 9.4 g/dL — ABNORMAL LOW (ref 11.6–15.9)
LYMPH%: 18 % (ref 14.0–49.7)
MCH: 24.8 pg — ABNORMAL LOW (ref 25.1–34.0)
MCHC: 31.4 g/dL — ABNORMAL LOW (ref 31.5–36.0)
MCV: 79 fL — ABNORMAL LOW (ref 79.5–101.0)
MONO#: 0.4 10*3/uL (ref 0.1–0.9)
MONO%: 7 % (ref 0.0–14.0)
NEUT%: 74.5 % (ref 38.4–76.8)
NEUTROS ABS: 4.1 10*3/uL (ref 1.5–6.5)
PLATELETS: 213 10*3/uL (ref 145–400)
RBC: 3.78 10*6/uL (ref 3.70–5.45)
RDW: 17.6 % — AB (ref 11.2–14.5)
WBC: 5.5 10*3/uL (ref 3.9–10.3)
lymph#: 1 10*3/uL (ref 0.9–3.3)

## 2013-04-19 LAB — COMPREHENSIVE METABOLIC PANEL (CC13)
ALK PHOS: 103 U/L (ref 40–150)
ALT: 10 U/L (ref 0–55)
AST: 15 U/L (ref 5–34)
Albumin: 3.7 g/dL (ref 3.5–5.0)
Anion Gap: 8 mEq/L (ref 3–11)
BUN: 18.5 mg/dL (ref 7.0–26.0)
CO2: 23 meq/L (ref 22–29)
Calcium: 9.6 mg/dL (ref 8.4–10.4)
Chloride: 106 mEq/L (ref 98–109)
Creatinine: 0.9 mg/dL (ref 0.6–1.1)
Glucose: 106 mg/dl (ref 70–140)
POTASSIUM: 6 meq/L — AB (ref 3.5–5.1)
Sodium: 137 mEq/L (ref 136–145)
Total Bilirubin: 0.34 mg/dL (ref 0.20–1.20)
Total Protein: 7.3 g/dL (ref 6.4–8.3)

## 2013-04-19 LAB — HOLD TUBE, BLOOD BANK

## 2013-04-19 MED ORDER — DEXAMETHASONE SODIUM PHOSPHATE 20 MG/5ML IJ SOLN
12.0000 mg | Freq: Once | INTRAMUSCULAR | Status: AC
  Administered 2013-04-19: 12 mg via INTRAVENOUS

## 2013-04-19 MED ORDER — SODIUM CHLORIDE 0.9 % IV SOLN
30.0000 mg/m2 | Freq: Once | INTRAVENOUS | Status: AC
  Administered 2013-04-19: 48 mg via INTRAVENOUS
  Filled 2013-04-19: qty 48

## 2013-04-19 MED ORDER — SODIUM CHLORIDE 0.9 % IV SOLN
150.0000 mg | Freq: Once | INTRAVENOUS | Status: AC
  Administered 2013-04-19: 150 mg via INTRAVENOUS
  Filled 2013-04-19: qty 5

## 2013-04-19 MED ORDER — SODIUM CHLORIDE 0.9 % IV SOLN
Freq: Once | INTRAVENOUS | Status: AC
  Administered 2013-04-19: 10:00:00 via INTRAVENOUS

## 2013-04-19 MED ORDER — SODIUM CHLORIDE 0.9 % IV SOLN
500.0000 mL | Freq: Once | INTRAVENOUS | Status: AC
  Administered 2013-04-19: 500 mL via INTRAVENOUS

## 2013-04-19 MED ORDER — HEPARIN SOD (PORK) LOCK FLUSH 100 UNIT/ML IV SOLN
500.0000 [IU] | Freq: Once | INTRAVENOUS | Status: AC | PRN
  Administered 2013-04-19: 500 [IU]
  Filled 2013-04-19: qty 5

## 2013-04-19 MED ORDER — DEXAMETHASONE SODIUM PHOSPHATE 20 MG/5ML IJ SOLN
INTRAMUSCULAR | Status: AC
  Filled 2013-04-19: qty 5

## 2013-04-19 MED ORDER — FUROSEMIDE 10 MG/ML IJ SOLN
20.0000 mg | Freq: Once | INTRAMUSCULAR | Status: AC
  Administered 2013-04-19: 20 mg via INTRAMUSCULAR

## 2013-04-19 MED ORDER — SODIUM CHLORIDE 0.9 % IJ SOLN
10.0000 mL | INTRAMUSCULAR | Status: DC | PRN
  Administered 2013-04-19: 10 mL
  Filled 2013-04-19: qty 10

## 2013-04-19 MED ORDER — PALONOSETRON HCL INJECTION 0.25 MG/5ML
INTRAVENOUS | Status: AC
  Filled 2013-04-19: qty 5

## 2013-04-19 MED ORDER — PALONOSETRON HCL INJECTION 0.25 MG/5ML
0.2500 mg | Freq: Once | INTRAVENOUS | Status: AC
  Administered 2013-04-19: 0.25 mg via INTRAVENOUS

## 2013-04-19 MED ORDER — SODIUM POLYSTYRENE SULFONATE 15 GM/60ML PO SUSP: 15.0000 g | Freq: Every day | ORAL | Status: DC

## 2013-04-19 NOTE — Progress Notes (Signed)
Pt instructed to take Kayexalate tonight and tomorrow for elevated potassium. Pt to return on Friday at 2:00 to have lab checked. Pt verbalized understanding.

## 2013-04-19 NOTE — Progress Notes (Signed)
The patient is noted to have severe hypokalemia. I plan to omit magnesium and potassium from her IV fluids. The patient would get extra hydration followed by IV Lasix. I will prescribe Kayexalate daily x3 days and bring her back in 3 days time to repeat basic metabolic panel.

## 2013-04-19 NOTE — Patient Instructions (Signed)
Take Kayexalate tonight, tomorrow and return for labs on Friday at 2:00. Dr Alvy Bimler will let you know your potassium level on Friday.

## 2013-04-21 ENCOUNTER — Other Ambulatory Visit (HOSPITAL_BASED_OUTPATIENT_CLINIC_OR_DEPARTMENT_OTHER): Payer: Medicare Other

## 2013-04-21 DIAGNOSIS — C030 Malignant neoplasm of upper gum: Secondary | ICD-10-CM

## 2013-04-21 DIAGNOSIS — E875 Hyperkalemia: Secondary | ICD-10-CM

## 2013-04-21 LAB — BASIC METABOLIC PANEL (CC13)
Anion Gap: 12 mEq/L — ABNORMAL HIGH (ref 3–11)
BUN: 19.7 mg/dL (ref 7.0–26.0)
CHLORIDE: 107 meq/L (ref 98–109)
CO2: 24 meq/L (ref 22–29)
Calcium: 9 mg/dL (ref 8.4–10.4)
Creatinine: 0.9 mg/dL (ref 0.6–1.1)
Glucose: 105 mg/dl (ref 70–140)
POTASSIUM: 4.1 meq/L (ref 3.5–5.1)
SODIUM: 143 meq/L (ref 136–145)

## 2013-04-26 ENCOUNTER — Ambulatory Visit: Payer: Self-pay

## 2013-04-26 ENCOUNTER — Ambulatory Visit (HOSPITAL_BASED_OUTPATIENT_CLINIC_OR_DEPARTMENT_OTHER): Payer: Medicare Other | Admitting: Hematology and Oncology

## 2013-04-26 ENCOUNTER — Ambulatory Visit (HOSPITAL_BASED_OUTPATIENT_CLINIC_OR_DEPARTMENT_OTHER): Payer: Medicare Other

## 2013-04-26 ENCOUNTER — Telehealth: Payer: Self-pay | Admitting: Hematology and Oncology

## 2013-04-26 ENCOUNTER — Other Ambulatory Visit (HOSPITAL_BASED_OUTPATIENT_CLINIC_OR_DEPARTMENT_OTHER): Payer: Medicare Other

## 2013-04-26 ENCOUNTER — Other Ambulatory Visit: Payer: Self-pay | Admitting: Hematology and Oncology

## 2013-04-26 ENCOUNTER — Telehealth: Payer: Self-pay | Admitting: *Deleted

## 2013-04-26 ENCOUNTER — Other Ambulatory Visit: Payer: Self-pay

## 2013-04-26 ENCOUNTER — Encounter: Payer: Self-pay | Admitting: Hematology and Oncology

## 2013-04-26 VITALS — BP 136/51 | HR 82 | Temp 98.1°F | Resp 18 | Ht 68.0 in | Wt 116.1 lb

## 2013-04-26 DIAGNOSIS — T451X5A Adverse effect of antineoplastic and immunosuppressive drugs, initial encounter: Secondary | ICD-10-CM

## 2013-04-26 DIAGNOSIS — C7800 Secondary malignant neoplasm of unspecified lung: Secondary | ICD-10-CM

## 2013-04-26 DIAGNOSIS — D630 Anemia in neoplastic disease: Secondary | ICD-10-CM

## 2013-04-26 DIAGNOSIS — C030 Malignant neoplasm of upper gum: Secondary | ICD-10-CM

## 2013-04-26 DIAGNOSIS — K121 Other forms of stomatitis: Secondary | ICD-10-CM

## 2013-04-26 DIAGNOSIS — E440 Moderate protein-calorie malnutrition: Secondary | ICD-10-CM

## 2013-04-26 DIAGNOSIS — E46 Unspecified protein-calorie malnutrition: Secondary | ICD-10-CM

## 2013-04-26 DIAGNOSIS — Z5111 Encounter for antineoplastic chemotherapy: Secondary | ICD-10-CM

## 2013-04-26 DIAGNOSIS — K1230 Oral mucositis (ulcerative), unspecified: Secondary | ICD-10-CM

## 2013-04-26 DIAGNOSIS — D6481 Anemia due to antineoplastic chemotherapy: Secondary | ICD-10-CM

## 2013-04-26 LAB — COMPREHENSIVE METABOLIC PANEL (CC13)
ALBUMIN: 3.5 g/dL (ref 3.5–5.0)
ALT: 10 U/L (ref 0–55)
AST: 15 U/L (ref 5–34)
Alkaline Phosphatase: 103 U/L (ref 40–150)
Anion Gap: 7 mEq/L (ref 3–11)
BUN: 18.3 mg/dL (ref 7.0–26.0)
CALCIUM: 9.7 mg/dL (ref 8.4–10.4)
CHLORIDE: 106 meq/L (ref 98–109)
CO2: 25 mEq/L (ref 22–29)
Creatinine: 0.9 mg/dL (ref 0.6–1.1)
GLUCOSE: 107 mg/dL (ref 70–140)
POTASSIUM: 4.8 meq/L (ref 3.5–5.1)
SODIUM: 138 meq/L (ref 136–145)
TOTAL PROTEIN: 7 g/dL (ref 6.4–8.3)
Total Bilirubin: 0.39 mg/dL (ref 0.20–1.20)

## 2013-04-26 LAB — CBC WITH DIFFERENTIAL/PLATELET
BASO%: 0.3 % (ref 0.0–2.0)
Basophils Absolute: 0 10*3/uL (ref 0.0–0.1)
EOS ABS: 0 10*3/uL (ref 0.0–0.5)
EOS%: 0.2 % (ref 0.0–7.0)
HCT: 25.9 % — ABNORMAL LOW (ref 34.8–46.6)
HGB: 8.3 g/dL — ABNORMAL LOW (ref 11.6–15.9)
LYMPH%: 19.3 % (ref 14.0–49.7)
MCH: 25.5 pg (ref 25.1–34.0)
MCHC: 32.1 g/dL (ref 31.5–36.0)
MCV: 79.4 fL — AB (ref 79.5–101.0)
MONO#: 0.3 10*3/uL (ref 0.1–0.9)
MONO%: 8.3 % (ref 0.0–14.0)
NEUT%: 71.9 % (ref 38.4–76.8)
NEUTROS ABS: 2.7 10*3/uL (ref 1.5–6.5)
Platelets: 174 10*3/uL (ref 145–400)
RBC: 3.26 10*6/uL — AB (ref 3.70–5.45)
RDW: 17.7 % — AB (ref 11.2–14.5)
WBC: 3.8 10*3/uL — ABNORMAL LOW (ref 3.9–10.3)
lymph#: 0.7 10*3/uL — ABNORMAL LOW (ref 0.9–3.3)

## 2013-04-26 LAB — HOLD TUBE, BLOOD BANK

## 2013-04-26 MED ORDER — SODIUM CHLORIDE 0.9 % IJ SOLN
10.0000 mL | INTRAMUSCULAR | Status: DC | PRN
  Administered 2013-04-26: 10 mL
  Filled 2013-04-26: qty 10

## 2013-04-26 MED ORDER — SODIUM CHLORIDE 0.9 % IV SOLN
30.0000 mg/m2 | Freq: Once | INTRAVENOUS | Status: AC
  Administered 2013-04-26: 48 mg via INTRAVENOUS
  Filled 2013-04-26: qty 48

## 2013-04-26 MED ORDER — PALONOSETRON HCL INJECTION 0.25 MG/5ML
INTRAVENOUS | Status: AC
  Filled 2013-04-26: qty 5

## 2013-04-26 MED ORDER — PALONOSETRON HCL INJECTION 0.25 MG/5ML
0.2500 mg | Freq: Once | INTRAVENOUS | Status: AC
  Administered 2013-04-26: 0.25 mg via INTRAVENOUS

## 2013-04-26 MED ORDER — DEXAMETHASONE SODIUM PHOSPHATE 20 MG/5ML IJ SOLN
12.0000 mg | Freq: Once | INTRAMUSCULAR | Status: AC
  Administered 2013-04-26: 12 mg via INTRAVENOUS

## 2013-04-26 MED ORDER — SODIUM CHLORIDE 0.9 % IV SOLN
INTRAVENOUS | Status: DC
  Administered 2013-04-26: 14:00:00 via INTRAVENOUS

## 2013-04-26 MED ORDER — SODIUM CHLORIDE 0.9 % IV SOLN
150.0000 mg | Freq: Once | INTRAVENOUS | Status: AC
  Administered 2013-04-26: 150 mg via INTRAVENOUS
  Filled 2013-04-26: qty 5

## 2013-04-26 MED ORDER — HEPARIN SOD (PORK) LOCK FLUSH 100 UNIT/ML IV SOLN
500.0000 [IU] | Freq: Once | INTRAVENOUS | Status: AC | PRN
  Administered 2013-04-26: 500 [IU]
  Filled 2013-04-26: qty 5

## 2013-04-26 MED ORDER — DEXAMETHASONE SODIUM PHOSPHATE 20 MG/5ML IJ SOLN
INTRAMUSCULAR | Status: AC
  Filled 2013-04-26: qty 5

## 2013-04-26 MED ORDER — SODIUM CHLORIDE 0.9 % IV SOLN
Freq: Once | INTRAVENOUS | Status: AC
  Administered 2013-04-26: 10:00:00 via INTRAVENOUS

## 2013-04-26 NOTE — Progress Notes (Signed)
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 The patient underwent right mastectomy and sentinel lymph node biopsy which showed a T1 CN 0M0 ER/PR positive HER-2/neu negative invasive ductal carcinoma. She was treated with adjuvant endocrine therapy with letrozole. 2) In January 2014, she complained of some jaw pain throat pain and an enlarged cervical lymph node. She saw Dr. Erik Obey who did a biopsy of the right maxillary alveolus which came back positive for invasive squamous cell carcinoma. CT scan show possible regional lymph node involvement. She went to [facility] and had surgery.  3) On 03/18/2012 she underwent maxillectomy, right neck dissection, and flap reconstruction at [facility]. Unfortunately she had positive margins as well as regional lymph node involvement. There were presence of lymphovascular invasion and perineural invasion. The size of the tumor was 5 cm in greatest dimension final staging was T4, N1, M0. 4) She returned to [facility] for further evaluation after surgery. The patient has canceled her appointments to See a Medical Oncologist Many Times. Ultimately, she underwent adjuvant radiation therapy only. 5) In September 2014, repeat PET/CT scan showed evidence of metastatic disease to the lungs. She's been referred here for further management. The patient received cycle 1 of chemotherapy last week on 11/09/2012 Her treatment was complicated by severe anemia. She received blood transfusion on 11/19/2012 & 12/21/2012 6) 11/21/2014repeat PET CT scan show positive  response to treatment 7) 01/03/2013 each cycle of chemo is modified to 2 weeks on 1 week off 8) 02/15/13 each cycle of chemotherapy is modified to 1 weeks on, 1 week off with 20% dose adjustment to to leukopenia, anemia, and mucositis 9) 03/10/2013 PET CT scan showed disease progression. The patient decided to undergo further chemotherapy   10) 03/22/13: The patient was started on weekly cisplatin at 30 mg per meter square   INTERVAL HISTORY: Kristine Morales 80 y.o. female returns for further followup. She has severe diarrhea due to prescription Kayexalate for treatment of severe hyperkalemia. She denies any worsening peripheral neuropathy. She still had persistent mouth sores. No nausea no vomiting. She has profound fatigue. Her oral intake remains very poor.  I have reviewed the past medical history, past surgical history, social history and family history with the patient and they are unchanged from previous note.  ALLERGIES:  has No Known Allergies.  MEDICATIONS:  Current Outpatient Prescriptions  Medication Sig Dispense Refill  . ALPHAGAN P 0.1 % SOLN Place 1 drop into both eyes 2 (two) times daily.       . dorzolamide-timolol (COSOPT) 22.3-6.8 MG/ML ophthalmic solution Place 1 drop into both eyes 2 (two) times daily.       Marland Kitchen lidocaine-prilocaine (EMLA) cream Apply topically as needed. Apply to [facility] a Cath site at least one hour prior to needle stick  30 g  1  . LORazepam (ATIVAN) 0.5 MG tablet Take 1 tablet (0.5 mg total) by mouth every 6 (six) hours as needed (nausea and/or vomiting.).  30 tablet  0  . Nutritional Supplements (FEEDING SUPPLEMENT, OSMOLITE 1.2 CAL,) LIQD Begin 360 mL Osmolite 1.2 QID with 60 mL free water before and after each bolus  feeding.  1440 mL  0  . ondansetron (ZOFRAN) 8 MG tablet Take 1 tablet (8 mg total) by mouth 2 (two) times daily as needed. Start on the third day after chemotherapy.  30 tablet  1  . oxyCODONE-acetaminophen (PERCOCET/ROXICET) 5-325 MG per  tablet Take 1 tablet by mouth every 8 (eight) hours as needed for severe pain.  30 tablet  0  . prochlorperazine (COMPAZINE) 10 MG tablet Take 1 tablet (10 mg total) by mouth every 6 (six) hours as needed (Nausea or vomiting).  30 tablet  1  . sodium fluoride (FLUORISHIELD) 1.1 % GEL dental gel Place 1 application onto teeth at bedtime.      . sodium polystyrene (KAYEXALATE) 15 GM/60ML suspension Take 60 mLs (15 g total) by mouth daily.  180 mL  0  . sodium polystyrene (KAYEXALATE) 15 GM/60ML suspension Take 60 mLs (15 g total) by mouth daily.  240 mL  0  . zolpidem (AMBIEN) 5 MG tablet Take 1 tablet (5 mg total) by mouth at bedtime as needed for sleep.  30 tablet  1   No current facility-administered medications for this visit.    REVIEW OF SYSTEMS:   Constitutional: Denies fevers, chills  Eyes: Denies blurriness of vision Respiratory: Denies cough, dyspnea or wheezes Cardiovascular: Denies palpitation, chest discomfort or lower extremity swelling Gastrointestinal:  Denies nausea, heartburn or change in bowel habits Skin: Denies abnormal skin rashes Lymphatics: Denies new lymphadenopathy or easy bruising Behavioral/Psych: Mood is stable, no new changes  All other systems were reviewed with the patient and are negative.  PHYSICAL EXAMINATION: ECOG PERFORMANCE STATUS: 1 - Symptomatic but completely ambulatory  Filed Vitals:   04/26/13 0914  BP: 136/51  Pulse: 82  Temp: 98.1 F (36.7 C)  Resp: 18   Filed Weights   04/26/13 0914  Weight: 116 lb 1.6 oz (52.663 kg)    GENERAL:alert, no distress and comfortable. She appears thin and cachectic SKIN: skin color, texture, turgor are normal, no rashes or significant lesions EYES: normal, Conjunctiva are pink and non-injected, sclera clear OROPHARYNX: Persistent mucositis is seen in the buccal mucosa, no thrush. NECK: Neck is thick with radiation induced skin fibrosis.  LYMPH:  no palpable lymphadenopathy in the cervical, axillary or  inguinal LUNGS: clear to auscultation and percussion with normal breathing effort HEART: regular rate & rhythm and no murmurs and no lower extremity edema ABDOMEN:abdomen soft, non-tender and normal bowel sounds. Feeding tube site looks okay Musculoskeletal:no cyanosis of digits and no clubbing  NEURO: alert & oriented x 3 with fluent speech, no focal motor/sensory deficits  LABORATORY DATA:  I have reviewed the data as listed    Component Value Date/Time   NA 138 04/26/2013 0901   NA 138 09/07/2012 1950   K 4.8 04/26/2013 0901   K 3.6 09/07/2012 1950   CL 103 09/07/2012 1950   CL 104 02/09/2012 0919   CO2 25 04/26/2013 0901   CO2 25 09/07/2012 1950   GLUCOSE 107 04/26/2013 0901   GLUCOSE 99 09/07/2012 1950   GLUCOSE 142* 02/09/2012 0919   BUN 18.3 04/26/2013 0901   BUN 25* 09/07/2012 1950   CREATININE 0.9 04/26/2013 0901   CREATININE 1.09 09/07/2012 1950   CALCIUM 9.7 04/26/2013 0901   CALCIUM 9.7 09/07/2012 1950   PROT 7.0 04/26/2013 0901   PROT 7.2 09/07/2012 1950   ALBUMIN 3.5 04/26/2013 0901   ALBUMIN 3.6 09/07/2012 1950   AST 15 04/26/2013 0901   AST 17 09/07/2012 1950  ALT 10 04/26/2013 0901   ALT 13 09/07/2012 1950   ALKPHOS 103 04/26/2013 0901   ALKPHOS 75 09/07/2012 1950   BILITOT 0.39 04/26/2013 0901   BILITOT 0.3 09/07/2012 1950   GFRNONAA 47* 09/07/2012 1950   GFRAA 54* 09/07/2012 1950    No results found for this basename: SPEP,  UPEP,   kappa and lambda light chains    Lab Results  Component Value Date   WBC 3.8* 04/26/2013   NEUTROABS 2.7 04/26/2013   HGB 8.3* 04/26/2013   HCT 25.9* 04/26/2013   MCV 79.4* 04/26/2013   PLT 174 04/26/2013      Chemistry      Component Value Date/Time   NA 138 04/26/2013 0901   NA 138 09/07/2012 1950   K 4.8 04/26/2013 0901   K 3.6 09/07/2012 1950   CL 103 09/07/2012 1950   CL 104 02/09/2012 0919   CO2 25 04/26/2013 0901   CO2 25 09/07/2012 1950   BUN 18.3 04/26/2013 0901   BUN 25* 09/07/2012 1950   CREATININE 0.9 04/26/2013 0901   CREATININE 1.09 09/07/2012 1950       Component Value Date/Time   CALCIUM 9.7 04/26/2013 0901   CALCIUM 9.7 09/07/2012 1950   ALKPHOS 103 04/26/2013 0901   ALKPHOS 75 09/07/2012 1950   AST 15 04/26/2013 0901   AST 17 09/07/2012 1950   ALT 10 04/26/2013 0901   ALT 13 09/07/2012 1950   BILITOT 0.39 04/26/2013 0901   BILITOT 0.3 09/07/2012 1950     ASSESSMENT & PLAN:  #1 metastatic squamous cell carcinoma of the head and neck We will continue weekly cisplatin. She appeared to tolerate that well with no major side effects apart from anemia. My plan would be to continue weekly treatment for minimum of 8 treatment before repeating a scan to assess response to therapy. #2 severe anemia This is due to chemotherapy. She is not symptomatic. I will proceed with treatment. #3 malnutrition I recommend she continue to use a feeding tube as needed #4 mild mucositis Continue conservative management. #5 recurrent hyperkalemia, resolved I will omit potassium supplements from her IV fluids.   Orders Placed This Encounter  Procedures  . CBC with Differential    Standing Status: Standing     Number of Occurrences: 9     Standing Expiration Date: 04/27/2014  . Comprehensive metabolic panel    Standing Status: Standing     Number of Occurrences: 9     Standing Expiration Date: 04/27/2014   All questions were answered. The patient knows to call the clinic with any problems, questions or concerns. No barriers to learning was detected.   Heath Lark, MD 04/26/2013 10:08 AM

## 2013-04-26 NOTE — Telephone Encounter (Signed)
Gave pt appt for lab,md and chemo for April, emailed [facility]

## 2013-04-26 NOTE — Telephone Encounter (Signed)
I have adjusted appt 

## 2013-04-26 NOTE — Patient Instructions (Signed)
[facility] Discharge Instructions for Patients Receiving Chemotherapy  Today you received the following chemotherapy agents: cisplatin  To help prevent nausea and vomiting after your treatment, we encourage you to take your nausea medication.  Take it as often as prescribed.     If you develop nausea and vomiting that is not controlled by your nausea medication, call the clinic. If it is after clinic hours your family physician or the after hours number for the clinic or go to the Emergency Department.   BELOW ARE SYMPTOMS THAT SHOULD BE REPORTED IMMEDIATELY:  *FEVER GREATER THAN 100.5 F  *CHILLS WITH OR WITHOUT FEVER  NAUSEA AND VOMITING THAT IS NOT CONTROLLED WITH YOUR NAUSEA MEDICATION  *UNUSUAL SHORTNESS OF BREATH  *UNUSUAL BRUISING OR BLEEDING  TENDERNESS IN MOUTH AND THROAT WITH OR WITHOUT PRESENCE OF ULCERS  *URINARY PROBLEMS  *BOWEL PROBLEMS  UNUSUAL RASH Items with * indicate a potential emergency and should be followed up as soon as possible.  Feel free to call the clinic you have any questions or concerns. The clinic phone number is [phone].   I have been informed and understand all the instructions given to me. I know to contact the clinic, my physician, or go to the Emergency Department if any problems should occur. I do not have any questions at this time, but understand that I may call the clinic during office hours   should I have any questions or need assistance in obtaining follow up care.    __________________________________________  _____________  __________ Signature of Patient or Authorized Representative            Date                   Time    __________________________________________ Nurse's Signature    

## 2013-04-26 NOTE — Progress Notes (Signed)
Per treatment plan notes, ok to treat with Hgb 8..3.  Pt to receive 1 L NS as pre-hydration - per treatment plan notes.   Per Dr. Alvy Bimler - ok to treat with urine output of 100.    Infusion completed without further output from patient.  Pt advised to drink over next few days.

## 2013-05-03 ENCOUNTER — Ambulatory Visit: Payer: Medicare Other | Admitting: Nutrition

## 2013-05-03 ENCOUNTER — Other Ambulatory Visit: Payer: Self-pay | Admitting: *Deleted

## 2013-05-03 ENCOUNTER — Other Ambulatory Visit: Payer: Self-pay | Admitting: Hematology and Oncology

## 2013-05-03 ENCOUNTER — Ambulatory Visit (HOSPITAL_BASED_OUTPATIENT_CLINIC_OR_DEPARTMENT_OTHER): Payer: Medicare Other

## 2013-05-03 ENCOUNTER — Telehealth: Payer: Self-pay | Admitting: Hematology and Oncology

## 2013-05-03 ENCOUNTER — Other Ambulatory Visit (HOSPITAL_BASED_OUTPATIENT_CLINIC_OR_DEPARTMENT_OTHER): Payer: Medicare Other

## 2013-05-03 VITALS — BP 160/61 | HR 78 | Temp 98.2°F | Resp 18

## 2013-05-03 DIAGNOSIS — C030 Malignant neoplasm of upper gum: Secondary | ICD-10-CM

## 2013-05-03 DIAGNOSIS — E440 Moderate protein-calorie malnutrition: Secondary | ICD-10-CM

## 2013-05-03 DIAGNOSIS — C7800 Secondary malignant neoplasm of unspecified lung: Secondary | ICD-10-CM

## 2013-05-03 DIAGNOSIS — E875 Hyperkalemia: Secondary | ICD-10-CM

## 2013-05-03 DIAGNOSIS — D630 Anemia in neoplastic disease: Secondary | ICD-10-CM

## 2013-05-03 DIAGNOSIS — Z5111 Encounter for antineoplastic chemotherapy: Secondary | ICD-10-CM

## 2013-05-03 LAB — CBC WITH DIFFERENTIAL/PLATELET
BASO%: 0.3 % (ref 0.0–2.0)
BASOS ABS: 0 10*3/uL (ref 0.0–0.1)
EOS ABS: 0 10*3/uL (ref 0.0–0.5)
EOS%: 0.2 % (ref 0.0–7.0)
HEMATOCRIT: 25.6 % — AB (ref 34.8–46.6)
HGB: 8.2 g/dL — ABNORMAL LOW (ref 11.6–15.9)
LYMPH#: 0.6 10*3/uL — AB (ref 0.9–3.3)
LYMPH%: 16.3 % (ref 14.0–49.7)
MCH: 25.4 pg (ref 25.1–34.0)
MCHC: 32 g/dL (ref 31.5–36.0)
MCV: 79.5 fL (ref 79.5–101.0)
MONO#: 0.3 10*3/uL (ref 0.1–0.9)
MONO%: 9.9 % (ref 0.0–14.0)
NEUT%: 73.3 % (ref 38.4–76.8)
NEUTROS ABS: 2.5 10*3/uL (ref 1.5–6.5)
Platelets: 192 10*3/uL (ref 145–400)
RBC: 3.22 10*6/uL — ABNORMAL LOW (ref 3.70–5.45)
RDW: 18.3 % — ABNORMAL HIGH (ref 11.2–14.5)
WBC: 3.4 10*3/uL — AB (ref 3.9–10.3)

## 2013-05-03 LAB — COMPREHENSIVE METABOLIC PANEL (CC13)
ALBUMIN: 3.5 g/dL (ref 3.5–5.0)
ALT: 8 U/L (ref 0–55)
ANION GAP: 7 meq/L (ref 3–11)
AST: 17 U/L (ref 5–34)
Alkaline Phosphatase: 101 U/L (ref 40–150)
BUN: 20.5 mg/dL (ref 7.0–26.0)
CALCIUM: 9.5 mg/dL (ref 8.4–10.4)
CHLORIDE: 105 meq/L (ref 98–109)
CO2: 24 meq/L (ref 22–29)
Creatinine: 1 mg/dL (ref 0.6–1.1)
Glucose: 107 mg/dl (ref 70–140)
POTASSIUM: 5.4 meq/L — AB (ref 3.5–5.1)
Sodium: 136 mEq/L (ref 136–145)
Total Bilirubin: 0.35 mg/dL (ref 0.20–1.20)
Total Protein: 7.1 g/dL (ref 6.4–8.3)

## 2013-05-03 LAB — HOLD TUBE, BLOOD BANK

## 2013-05-03 MED ORDER — SODIUM CHLORIDE 0.9 % IV SOLN
Freq: Once | INTRAVENOUS | Status: AC
  Administered 2013-05-03: 10:00:00 via INTRAVENOUS

## 2013-05-03 MED ORDER — SODIUM CHLORIDE 0.9 % IV SOLN
30.0000 mg/m2 | Freq: Once | INTRAVENOUS | Status: AC
  Administered 2013-05-03: 48 mg via INTRAVENOUS
  Filled 2013-05-03: qty 48

## 2013-05-03 MED ORDER — DEXAMETHASONE SODIUM PHOSPHATE 20 MG/5ML IJ SOLN
INTRAMUSCULAR | Status: AC
  Filled 2013-05-03: qty 5

## 2013-05-03 MED ORDER — DEXAMETHASONE SODIUM PHOSPHATE 20 MG/5ML IJ SOLN
12.0000 mg | Freq: Once | INTRAMUSCULAR | Status: AC
  Administered 2013-05-03: 12 mg via INTRAVENOUS

## 2013-05-03 MED ORDER — HEPARIN SOD (PORK) LOCK FLUSH 100 UNIT/ML IV SOLN
500.0000 [IU] | Freq: Once | INTRAVENOUS | Status: AC | PRN
  Administered 2013-05-03: 500 [IU]
  Filled 2013-05-03: qty 5

## 2013-05-03 MED ORDER — SODIUM POLYSTYRENE SULFONATE 15 GM/60ML PO SUSP: 15.0000 g | Freq: Every day | ORAL | Status: DC

## 2013-05-03 MED ORDER — SODIUM CHLORIDE 0.9 % IV SOLN
150.0000 mg | Freq: Once | INTRAVENOUS | Status: AC
  Administered 2013-05-03: 150 mg via INTRAVENOUS
  Filled 2013-05-03: qty 5

## 2013-05-03 MED ORDER — SODIUM CHLORIDE 0.9 % IJ SOLN
10.0000 mL | INTRAMUSCULAR | Status: DC | PRN
  Administered 2013-05-03: 10 mL
  Filled 2013-05-03: qty 10

## 2013-05-03 MED ORDER — PALONOSETRON HCL INJECTION 0.25 MG/5ML
0.2500 mg | Freq: Once | INTRAVENOUS | Status: AC
  Administered 2013-05-03: 0.25 mg via INTRAVENOUS

## 2013-05-03 MED ORDER — SODIUM POLYSTYRENE SULFONATE 15 GM/60ML PO SUSP: 15.0000 g | Freq: Every day | ORAL | Status: AC

## 2013-05-03 MED ORDER — PALONOSETRON HCL INJECTION 0.25 MG/5ML
INTRAVENOUS | Status: AC
  Filled 2013-05-03: qty 5

## 2013-05-03 NOTE — Progress Notes (Signed)
Per Dr. Alvy Bimler, it's OK to start Cisplatin with a pre void of 175 mls.

## 2013-05-03 NOTE — Telephone Encounter (Signed)
Gave pt appt for lab,md and chemo for April 2015 °

## 2013-05-03 NOTE — Patient Instructions (Addendum)
[facility] Discharge Instructions for Patients Receiving Chemotherapy A prescription for Kayexalate was phoned into your pharmacy. Pick up on way home today.   Today you received the following chemotherapy agents:  Cisplatin  To help prevent nausea and vomiting after your treatment, we encourage you to take your nausea medication as ordered per MD.   If you develop nausea and vomiting that is not controlled by your nausea medication, call the clinic.   BELOW ARE SYMPTOMS THAT SHOULD BE REPORTED IMMEDIATELY:  *FEVER GREATER THAN 100.5 F  *CHILLS WITH OR WITHOUT FEVER  NAUSEA AND VOMITING THAT IS NOT CONTROLLED WITH YOUR NAUSEA MEDICATION  *UNUSUAL SHORTNESS OF BREATH  *UNUSUAL BRUISING OR BLEEDING  TENDERNESS IN MOUTH AND THROAT WITH OR WITHOUT PRESENCE OF ULCERS  *URINARY PROBLEMS  *BOWEL PROBLEMS  UNUSUAL RASH Items with * indicate a potential emergency and should be followed up as soon as possible.  Feel free to call the clinic you have any questions or concerns. The clinic phone number is [phone].

## 2013-05-03 NOTE — Progress Notes (Signed)
Pre Cisplatin void was 175 and post Cisplatin void was 100.

## 2013-05-03 NOTE — Progress Notes (Signed)
Followup completed with patient during chemotherapy.  She is continuing to eat 100% by mouth.  She does not use her feeding tube but she does flush this with water daily.  Weight is stable at 116.1 pounds.  Patient denies nutritional problems.  Nutrition diagnosis of inadequate oral intake resolved.  Patient educated to continue 5 cans of Ensure Plus with soft food diet as tolerated.  I will continue to monitor patient's for weight loss.  Will follow up on April 29th.

## 2013-05-03 NOTE — Progress Notes (Signed)
Instructed pt on taking Kayexalate daily x 3 days.  Start tonight.  Sent Rx electronically per pt request so it will be ready to pick up.  Also gave her paper Rx as reminder to get medication. Explained her potassium level is elevated again.  She verbalized understanding.

## 2013-05-10 ENCOUNTER — Ambulatory Visit (HOSPITAL_BASED_OUTPATIENT_CLINIC_OR_DEPARTMENT_OTHER): Payer: Medicare Other

## 2013-05-10 ENCOUNTER — Ambulatory Visit (HOSPITAL_COMMUNITY)
Admission: RE | Admit: 2013-05-10 | Discharge: 2013-05-10 | Disposition: A | Discharge status: Home / Self Care | Payer: Medicare Other | Source: Ambulatory Visit | Attending: Hematology and Oncology | Admitting: Hematology and Oncology

## 2013-05-10 ENCOUNTER — Other Ambulatory Visit (HOSPITAL_BASED_OUTPATIENT_CLINIC_OR_DEPARTMENT_OTHER): Payer: Medicare Other

## 2013-05-10 ENCOUNTER — Ambulatory Visit (HOSPITAL_BASED_OUTPATIENT_CLINIC_OR_DEPARTMENT_OTHER): Payer: Medicare Other | Admitting: Hematology and Oncology

## 2013-05-10 ENCOUNTER — Telehealth: Payer: Self-pay | Admitting: Hematology and Oncology

## 2013-05-10 VITALS — BP 179/65 | HR 68 | Temp 97.5°F | Resp 18

## 2013-05-10 VITALS — BP 140/61 | HR 82 | Temp 97.7°F | Resp 18 | Ht 68.0 in | Wt 118.2 lb

## 2013-05-10 DIAGNOSIS — D72819 Decreased white blood cell count, unspecified: Secondary | ICD-10-CM

## 2013-05-10 DIAGNOSIS — C030 Malignant neoplasm of upper gum: Secondary | ICD-10-CM

## 2013-05-10 DIAGNOSIS — D6481 Anemia due to antineoplastic chemotherapy: Secondary | ICD-10-CM

## 2013-05-10 DIAGNOSIS — T451X5A Adverse effect of antineoplastic and immunosuppressive drugs, initial encounter: Secondary | ICD-10-CM

## 2013-05-10 DIAGNOSIS — E440 Moderate protein-calorie malnutrition: Secondary | ICD-10-CM

## 2013-05-10 DIAGNOSIS — D630 Anemia in neoplastic disease: Secondary | ICD-10-CM

## 2013-05-10 DIAGNOSIS — K1230 Oral mucositis (ulcerative), unspecified: Secondary | ICD-10-CM

## 2013-05-10 DIAGNOSIS — C7800 Secondary malignant neoplasm of unspecified lung: Secondary | ICD-10-CM

## 2013-05-10 DIAGNOSIS — E46 Unspecified protein-calorie malnutrition: Secondary | ICD-10-CM

## 2013-05-10 DIAGNOSIS — K121 Other forms of stomatitis: Secondary | ICD-10-CM

## 2013-05-10 DIAGNOSIS — D649 Anemia, unspecified: Secondary | ICD-10-CM | POA: Insufficient documentation

## 2013-05-10 LAB — CBC WITH DIFFERENTIAL/PLATELET
BASO%: 0.4 % (ref 0.0–2.0)
Basophils Absolute: 0 10*3/uL (ref 0.0–0.1)
EOS%: 0.1 % (ref 0.0–7.0)
Eosinophils Absolute: 0 10*3/uL (ref 0.0–0.5)
HEMATOCRIT: 23.3 % — AB (ref 34.8–46.6)
HGB: 7.4 g/dL — ABNORMAL LOW (ref 11.6–15.9)
LYMPH#: 0.5 10*3/uL — AB (ref 0.9–3.3)
LYMPH%: 18.9 % (ref 14.0–49.7)
MCH: 25.7 pg (ref 25.1–34.0)
MCHC: 31.9 g/dL (ref 31.5–36.0)
MCV: 80.8 fL (ref 79.5–101.0)
MONO#: 0.2 10*3/uL (ref 0.1–0.9)
MONO%: 8.3 % (ref 0.0–14.0)
NEUT#: 2.1 10*3/uL (ref 1.5–6.5)
NEUT%: 72.3 % (ref 38.4–76.8)
Platelets: 186 10*3/uL (ref 145–400)
RBC: 2.89 10*6/uL — AB (ref 3.70–5.45)
RDW: 18.9 % — ABNORMAL HIGH (ref 11.2–14.5)
WBC: 2.9 10*3/uL — AB (ref 3.9–10.3)

## 2013-05-10 LAB — COMPREHENSIVE METABOLIC PANEL (CC13)
ALT: 8 U/L (ref 0–55)
AST: 15 U/L (ref 5–34)
Albumin: 3.4 g/dL — ABNORMAL LOW (ref 3.5–5.0)
Alkaline Phosphatase: 100 U/L (ref 40–150)
Anion Gap: 11 mEq/L (ref 3–11)
BUN: 21.4 mg/dL (ref 7.0–26.0)
CALCIUM: 9.6 mg/dL (ref 8.4–10.4)
CHLORIDE: 106 meq/L (ref 98–109)
CO2: 22 meq/L (ref 22–29)
Creatinine: 1.1 mg/dL (ref 0.6–1.1)
GLUCOSE: 126 mg/dL (ref 70–140)
Potassium: 4.6 mEq/L (ref 3.5–5.1)
SODIUM: 139 meq/L (ref 136–145)
Total Bilirubin: 0.27 mg/dL (ref 0.20–1.20)
Total Protein: 7 g/dL (ref 6.4–8.3)

## 2013-05-10 LAB — HOLD TUBE, BLOOD BANK

## 2013-05-10 LAB — MAGNESIUM (CC13): Magnesium: 1.3 mg/dl — CL (ref 1.5–2.5)

## 2013-05-10 MED ORDER — DIPHENHYDRAMINE HCL 25 MG PO CAPS
ORAL_CAPSULE | ORAL | Status: AC
  Filled 2013-05-10: qty 1

## 2013-05-10 MED ORDER — SODIUM CHLORIDE 0.9 % IV SOLN
250.0000 mL | Freq: Once | INTRAVENOUS | Status: AC
  Administered 2013-05-10: 250 mL via INTRAVENOUS

## 2013-05-10 MED ORDER — ACETAMINOPHEN 325 MG PO TABS
650.0000 mg | ORAL_TABLET | Freq: Once | ORAL | Status: AC
  Administered 2013-05-10: 650 mg via ORAL

## 2013-05-10 MED ORDER — ACETAMINOPHEN 325 MG PO TABS
ORAL_TABLET | ORAL | Status: AC
  Filled 2013-05-10: qty 2

## 2013-05-10 MED ORDER — SODIUM CHLORIDE 0.9 % IJ SOLN
10.0000 mL | INTRAMUSCULAR | Status: AC | PRN
  Administered 2013-05-10: 10 mL
  Filled 2013-05-10: qty 10

## 2013-05-10 MED ORDER — HEPARIN SOD (PORK) LOCK FLUSH 100 UNIT/ML IV SOLN
500.0000 [IU] | Freq: Every day | INTRAVENOUS | Status: AC | PRN
  Administered 2013-05-10: 500 [IU]
  Filled 2013-05-10: qty 5

## 2013-05-10 MED ORDER — DIPHENHYDRAMINE HCL 25 MG PO CAPS
25.0000 mg | ORAL_CAPSULE | Freq: Once | ORAL | Status: AC
  Administered 2013-05-10: 25 mg via ORAL

## 2013-05-10 NOTE — Patient Instructions (Signed)
Blood Transfusion Information WHAT IS A BLOOD TRANSFUSION? A transfusion is the replacement of blood or some of its parts. Blood is made up of multiple cells which provide different functions.  Red blood cells carry oxygen and are used for blood loss replacement.  White blood cells fight against infection.  Platelets control bleeding.  Plasma helps clot blood.  Other blood products are available for specialized needs, such as hemophilia or other clotting disorders. BEFORE THE TRANSFUSION  Who gives blood for transfusions?   You may be able to donate blood to be used at a later date on yourself (autologous donation).  Relatives can be asked to donate blood. This is generally not any safer than if you have received blood from a stranger. The same precautions are taken to ensure safety when a relative's blood is donated.  Healthy volunteers who are fully evaluated to make sure their blood is safe. This is blood bank blood. Transfusion therapy is the safest it has ever been in the practice of medicine. Before blood is taken from a donor, a complete history is taken to make sure that person has no history of diseases nor engages in risky social behavior (examples are intravenous drug use or sexual activity with multiple partners). The donor's travel history is screened to minimize risk of transmitting infections, such as malaria. The donated blood is tested for signs of infectious diseases, such as HIV and hepatitis. The blood is then tested to be sure it is compatible with you in order to minimize the chance of a transfusion reaction. If you or a relative donates blood, this is often done in anticipation of surgery and is not appropriate for emergency situations. It takes many days to process the donated blood. RISKS AND COMPLICATIONS Although transfusion therapy is very safe and saves many lives, the main dangers of transfusion include:   Getting an infectious disease.  Developing a  transfusion reaction. This is an allergic reaction to something in the blood you were given. Every precaution is taken to prevent this. The decision to have a blood transfusion has been considered carefully by your caregiver before blood is given. Blood is not given unless the benefits outweigh the risks. AFTER THE TRANSFUSION  Right after receiving a blood transfusion, you will usually feel much better and more energetic. This is especially true if your red blood cells have gotten low (anemic). The transfusion raises the level of the red blood cells which carry oxygen, and this usually causes an energy increase.  The nurse administering the transfusion will monitor you carefully for complications. HOME CARE INSTRUCTIONS  No special instructions are needed after a transfusion. You may find your energy is better. Speak with your caregiver about any limitations on activity for underlying diseases you may have. SEEK MEDICAL CARE IF:   Your condition is not improving after your transfusion.  You develop redness or irritation at the intravenous (IV) site. SEEK IMMEDIATE MEDICAL CARE IF:  Any of the following symptoms occur over the next 12 hours:  Shaking chills.  You have a temperature by mouth above 102 F (38.9 C), not controlled by medicine.  Chest, back, or muscle pain.  People around you feel you are not acting correctly or are confused.  Shortness of breath or difficulty breathing.  Dizziness and fainting.  You get a rash or develop hives.  You have a decrease in urine output.  Your urine turns a dark color or changes to pink, red, or brown. Any of the following   symptoms occur over the next 10 days:  You have a temperature by mouth above 102 F (38.9 C), not controlled by medicine.  Shortness of breath.  Weakness after normal activity.  The white part of the eye turns yellow (jaundice).  You have a decrease in the amount of urine or are urinating less often.  Your  urine turns a dark color or changes to pink, red, or brown. Document Released: 01/03/2000 Document Revised: 03/30/2011 Document Reviewed: 08/22/2007 [employer] Patient Information 2014 [employer].  

## 2013-05-10 NOTE — Progress Notes (Signed)
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 The patient underwent right mastectomy and sentinel lymph node biopsy which showed a T1 CN 0M0 ER/PR positive HER-2/neu negative invasive ductal carcinoma. She was treated with adjuvant endocrine therapy with letrozole. 2) In January 2014, she complained of some jaw pain throat pain and an enlarged cervical lymph node. She saw Dr. Erik Morales who did a biopsy of the right maxillary alveolus which came back positive for invasive squamous cell carcinoma. CT scan show possible regional lymph node involvement. She went to [facility] and had surgery.  3) On 03/18/2012 she underwent maxillectomy, right neck dissection, and flap reconstruction at [facility]. Unfortunately she had positive margins as well as regional lymph node involvement. There were presence of lymphovascular invasion and perineural invasion. The size of the tumor was 5 cm in greatest dimension final staging was T4, N1, M0. 4) She returned to [facility] for further evaluation after surgery. The patient has canceled her appointments to See a Medical Oncologist Many Times. Ultimately, she underwent adjuvant radiation therapy only. 5) In September 2014, repeat PET/CT scan showed evidence of metastatic disease to the lungs. She's been referred here for further management. The patient received cycle 1 of chemotherapy last week on 11/09/2012 Her treatment was complicated by severe anemia. She received blood transfusion on 11/19/2012 & 12/21/2012 6) 11/21/2014repeat PET CT scan show positive  response to treatment 7) 01/03/2013 each cycle of chemo is modified to 2 weeks on 1 week off 8) 02/15/13 each cycle of chemotherapy is modified to 1 weeks on, 1 week off with 20% dose adjustment to to leukopenia, anemia, and mucositis 9) 03/10/2013 PET CT scan showed disease progression. The patient decided to undergo further chemotherapy   10) 03/22/13: The patient was started on weekly cisplatin at 30 mg per meter square   INTERVAL HISTORY: Kristine Morales 80 y.o. female returns for further followup. She complained of fatigue. She denies any diarrhea recently from Kayexalate treatment. She still had persistent mucositis, slightly worse. She is able to maintain nutritional intake of 5 cans of nutritional supplement per day. She has not lost any weight. Denies any chest pain, dizziness or shortness of breath. The patient denies any recent signs or symptoms of bleeding such as spontaneous epistaxis, hematuria or hematochezia.  I have reviewed the past medical history, past surgical history, social history and family history with the patient and they are unchanged from previous note.  ALLERGIES:  has No Known Allergies.  MEDICATIONS:  Current Outpatient Prescriptions  Medication Sig Dispense Refill  . lidocaine-prilocaine (EMLA) cream Apply topically as needed. Apply to [facility] a Cath site at least one hour prior to needle stick  30 g  1  . Nutritional Supplements (FEEDING SUPPLEMENT, OSMOLITE 1.2 CAL,) LIQD Begin 360 mL Osmolite 1.2 QID with 60 mL free water before and after each bolus feeding.  1440 mL  0  . oxyCODONE-acetaminophen (PERCOCET/ROXICET) 5-325 MG per tablet Take 1 tablet by mouth every 8 (eight) hours as needed for severe pain.  30 tablet  0  . sodium fluoride (FLUORISHIELD) 1.1 % GEL dental gel Place 1 application onto  teeth at bedtime.      . sodium polystyrene (KAYEXALATE) 15 GM/60ML suspension Take 60 mLs (15 g total) by mouth daily.  500 mL  0  . White Petrolatum-Mineral Oil  (RETAINE PM) OINT Apply to thigh daily      . zolpidem (AMBIEN) 5 MG tablet Take 1 tablet (5 mg total) by mouth at bedtime as needed for sleep.  30 tablet  1  . ALPHAGAN P 0.1 % SOLN Place 1 drop into both eyes 2 (two) times daily.       . dorzolamide-timolol (COSOPT) 22.3-6.8 MG/ML ophthalmic solution Place 1 drop into both eyes 2 (two) times daily.       Marland Kitchen LORazepam (ATIVAN) 0.5 MG tablet Take 1 tablet (0.5 mg total) by mouth every 6 (six) hours as needed (nausea and/or vomiting.).  30 tablet  0  . ondansetron (ZOFRAN) 8 MG tablet Take 1 tablet (8 mg total) by mouth 2 (two) times daily as needed. Start on the third day after chemotherapy.  30 tablet  1  . prochlorperazine (COMPAZINE) 10 MG tablet Take 1 tablet (10 mg total) by mouth every 6 (six) hours as needed (Nausea or vomiting).  30 tablet  1   No current facility-administered medications for this visit.   Facility-Administered Medications Ordered in Other Visits  Medication Dose Route Frequency Provider Last Rate Last Dose  . heparin lock flush 100 unit/mL  500 Units Intracatheter Daily PRN Kristine Lark, MD      . sodium chloride 0.9 % injection 10 mL  10 mL Intracatheter PRN Kristine Lark, MD        REVIEW OF SYSTEMS:   Constitutional: Denies fevers, chills or abnormal weight loss Eyes: Denies blurriness of vision Respiratory: Denies cough, dyspnea or wheezes Cardiovascular: Denies palpitation, chest discomfort or lower extremity swelling Gastrointestinal:  Denies nausea, heartburn or change in bowel habits Skin: Denies abnormal skin rashes Lymphatics: Denies new lymphadenopathy or easy bruising Neurological:Denies numbness, tingling or new weaknesses Behavioral/Psych: Mood is stable, no new changes  All other systems were reviewed with the patient and are negative.  PHYSICAL EXAMINATION: ECOG PERFORMANCE STATUS: 1 - Symptomatic but completely ambulatory  Filed Vitals:   05/10/13 0901  BP: 140/61  Pulse: 82  Temp: 97.7 F (36.5  C)  Resp: 18   Filed Weights   05/10/13 0901  Weight: 118 lb 3.2 oz (53.615 kg)    GENERAL:alert, no distress and comfortable. She looks thin and cachectic SKIN: skin color, texture, turgor are normal, no rashes or significant lesions EYES: normal, Conjunctiva are pale and non-injected, sclera clear OROPHARYNX: Significant mucositis in her lips and comes, no evidence of thrush NECK: Thick from radiation induced fibrosis in prior surgeries LYMPH:  no palpable lymphadenopathy in the cervical, axillary or inguinal LUNGS: clear to auscultation and percussion with normal breathing effort HEART: regular rate & rhythm and no murmurs and no lower extremity edema ABDOMEN:abdomen soft, non-tender and normal bowel sounds. Feeding tube site looks okay Musculoskeletal:no cyanosis of digits and no clubbing  NEURO: alert & oriented x 3 with fluent speech, no focal motor/sensory deficits  LABORATORY DATA:  I have reviewed the data as listed    Component Value Date/Time   NA 139 05/10/2013 0845   NA 138 09/07/2012 1950   K 4.6 05/10/2013 0845   K 3.6 09/07/2012 1950   CL 103 09/07/2012 1950   CL 104 02/09/2012 0919   CO2 22 05/10/2013 0845   CO2 25 09/07/2012 1950   GLUCOSE  126 05/10/2013 0845   GLUCOSE 99 09/07/2012 1950   GLUCOSE 142* 02/09/2012 0919   BUN 21.4 05/10/2013 0845   BUN 25* 09/07/2012 1950   CREATININE 1.1 05/10/2013 0845   CREATININE 1.09 09/07/2012 1950   CALCIUM 9.6 05/10/2013 0845   CALCIUM 9.7 09/07/2012 1950   PROT 7.0 05/10/2013 0845   PROT 7.2 09/07/2012 1950   ALBUMIN 3.4* 05/10/2013 0845   ALBUMIN 3.6 09/07/2012 1950   AST 15 05/10/2013 0845   AST 17 09/07/2012 1950   ALT 8 05/10/2013 0845   ALT 13 09/07/2012 1950   ALKPHOS 100 05/10/2013 0845   ALKPHOS 75 09/07/2012 1950   BILITOT 0.27 05/10/2013 0845   BILITOT 0.3 09/07/2012 1950   GFRNONAA 47* 09/07/2012 1950   GFRAA 54* 09/07/2012 1950    No results found for this basename: SPEP,  UPEP,   kappa and lambda light chains    Lab  Results  Component Value Date   WBC 2.9* 05/10/2013   NEUTROABS 2.1 05/10/2013   HGB 7.4* 05/10/2013   HCT 23.3* 05/10/2013   MCV 80.8 05/10/2013   PLT 186 05/10/2013      Chemistry      Component Value Date/Time   NA 139 05/10/2013 0845   NA 138 09/07/2012 1950   K 4.6 05/10/2013 0845   K 3.6 09/07/2012 1950   CL 103 09/07/2012 1950   CL 104 02/09/2012 0919   CO2 22 05/10/2013 0845   CO2 25 09/07/2012 1950   BUN 21.4 05/10/2013 0845   BUN 25* 09/07/2012 1950   CREATININE 1.1 05/10/2013 0845   CREATININE 1.09 09/07/2012 1950      Component Value Date/Time   CALCIUM 9.6 05/10/2013 0845   CALCIUM 9.7 09/07/2012 1950   ALKPHOS 100 05/10/2013 0845   ALKPHOS 75 09/07/2012 1950   AST 15 05/10/2013 0845   AST 17 09/07/2012 1950   ALT 8 05/10/2013 0845   ALT 13 09/07/2012 1950   BILITOT 0.27 05/10/2013 0845   BILITOT 0.3 09/07/2012 1950     ASSESSMENT & PLAN:  #1 metastatic squamous cell carcinoma of the head and neck Due to progressive leukopenia and severe anemia, I will hold her treatment today. She will get her eighth treatment next week. After that, I plan to repeat PET/CT scan to assess response to treatment. #2 severe anemia This is due to chemotherapy. We discussed some of the risks, benefits, and alternatives of blood transfusions. The patient is symptomatic from anemia and the hemoglobin level is critically low.  Some of the side-effects to be expected including risks of transfusion reactions, chills, infection, syndrome of volume overload and risk of hospitalization from various reasons and the patient is willing to proceed and went ahead to sign consent today. #3 malnutrition I recommend she continue to use a feeding tube as needed #4 mild mucositis Continue conservative management. #5 recurrent hyperkalemia, resolved I will omit potassium supplements from her IV fluids. #6 leukopenia This is likely due to recent treatment. The patient denies recent history of fevers, cough, chills,  diarrhea or dysuria. She is asymptomatic from the leukopenia. I will observe for now.  I will continue the chemotherapy at current dose without dosage adjustment.  If the leukopenia gets progressive worse in the future, I might have to delay her treatment or adjust the chemotherapy dose. #7 Low magnesium This is likely due to recent Kayexalate treatment and cisplatin. I plan to hold treatment hopefully that will allow time for recovery.   Orders  Placed This Encounter  Procedures  . NM PET Image Restag (PS) Skull Base To Thigh    Standing Status: Future     Number of Occurrences:      Standing Expiration Date: 07/10/2014    Order Specific Question:  Reason for Exam (SYMPTOM  OR DIAGNOSIS REQUIRED)    Answer:  assess disease response, metastatic maxilla ca    Order Specific Question:  Preferred imaging location?    Answer:  [facility]   All questions were answered. The patient knows to call the clinic with any problems, questions or concerns. No barriers to learning was detected. I spent 40 minutes counseling the patient face to face. The total time spent in the appointment was 55 minutes and more than 50% was on counseling and review of test results     Kristine Lark, MD 05/10/2013 12:23 PM

## 2013-05-10 NOTE — Telephone Encounter (Signed)
Gave pt appt for lab,MD and chemo for April and May 2015

## 2013-05-11 LAB — TYPE AND SCREEN
ABO/RH(D): A NEG
ANTIBODY SCREEN: NEGATIVE
UNIT DIVISION: 0

## 2013-05-17 ENCOUNTER — Ambulatory Visit: Payer: Medicare Other | Admitting: Nutrition

## 2013-05-17 ENCOUNTER — Other Ambulatory Visit (HOSPITAL_BASED_OUTPATIENT_CLINIC_OR_DEPARTMENT_OTHER): Payer: Medicare Other

## 2013-05-17 ENCOUNTER — Other Ambulatory Visit: Payer: Self-pay | Admitting: Hematology and Oncology

## 2013-05-17 ENCOUNTER — Ambulatory Visit (HOSPITAL_BASED_OUTPATIENT_CLINIC_OR_DEPARTMENT_OTHER): Payer: Medicare Other

## 2013-05-17 VITALS — BP 174/74 | HR 85 | Temp 98.2°F | Resp 20

## 2013-05-17 DIAGNOSIS — C030 Malignant neoplasm of upper gum: Secondary | ICD-10-CM

## 2013-05-17 DIAGNOSIS — D630 Anemia in neoplastic disease: Secondary | ICD-10-CM

## 2013-05-17 DIAGNOSIS — Z5111 Encounter for antineoplastic chemotherapy: Secondary | ICD-10-CM

## 2013-05-17 DIAGNOSIS — C7800 Secondary malignant neoplasm of unspecified lung: Secondary | ICD-10-CM

## 2013-05-17 DIAGNOSIS — E440 Moderate protein-calorie malnutrition: Secondary | ICD-10-CM

## 2013-05-17 LAB — CBC WITH DIFFERENTIAL/PLATELET
BASO%: 0.5 % (ref 0.0–2.0)
Basophils Absolute: 0 10*3/uL (ref 0.0–0.1)
EOS%: 0.1 % (ref 0.0–7.0)
Eosinophils Absolute: 0 10*3/uL (ref 0.0–0.5)
HCT: 27.9 % — ABNORMAL LOW (ref 34.8–46.6)
HGB: 8.9 g/dL — ABNORMAL LOW (ref 11.6–15.9)
LYMPH%: 20 % (ref 14.0–49.7)
MCH: 26.3 pg (ref 25.1–34.0)
MCHC: 32.1 g/dL (ref 31.5–36.0)
MCV: 81.9 fL (ref 79.5–101.0)
MONO#: 0.2 10*3/uL (ref 0.1–0.9)
MONO%: 6.9 % (ref 0.0–14.0)
NEUT#: 2.5 10*3/uL (ref 1.5–6.5)
NEUT%: 72.5 % (ref 38.4–76.8)
PLATELETS: 214 10*3/uL (ref 145–400)
RBC: 3.4 10*6/uL — AB (ref 3.70–5.45)
RDW: 17.6 % — ABNORMAL HIGH (ref 11.2–14.5)
WBC: 3.4 10*3/uL — ABNORMAL LOW (ref 3.9–10.3)
lymph#: 0.7 10*3/uL — ABNORMAL LOW (ref 0.9–3.3)

## 2013-05-17 LAB — COMPREHENSIVE METABOLIC PANEL (CC13)
ALBUMIN: 3.3 g/dL — AB (ref 3.5–5.0)
ALT: 7 U/L (ref 0–55)
AST: 13 U/L (ref 5–34)
Alkaline Phosphatase: 111 U/L (ref 40–150)
Anion Gap: 10 mEq/L (ref 3–11)
BUN: 24.3 mg/dL (ref 7.0–26.0)
CHLORIDE: 106 meq/L (ref 98–109)
CO2: 22 mEq/L (ref 22–29)
Calcium: 9.9 mg/dL (ref 8.4–10.4)
Creatinine: 1.3 mg/dL — ABNORMAL HIGH (ref 0.6–1.1)
GLUCOSE: 144 mg/dL — AB (ref 70–140)
Potassium: 5.1 mEq/L (ref 3.5–5.1)
Sodium: 137 mEq/L (ref 136–145)
Total Bilirubin: 0.27 mg/dL (ref 0.20–1.20)
Total Protein: 7.1 g/dL (ref 6.4–8.3)

## 2013-05-17 LAB — HOLD TUBE, BLOOD BANK

## 2013-05-17 MED ORDER — SODIUM CHLORIDE 0.9 % IJ SOLN
10.0000 mL | INTRAMUSCULAR | Status: DC | PRN
  Administered 2013-05-17: 10 mL
  Filled 2013-05-17: qty 10

## 2013-05-17 MED ORDER — PALONOSETRON HCL INJECTION 0.25 MG/5ML
0.2500 mg | Freq: Once | INTRAVENOUS | Status: AC
  Administered 2013-05-17: 0.25 mg via INTRAVENOUS

## 2013-05-17 MED ORDER — SODIUM CHLORIDE 0.9 % IV SOLN
Freq: Once | INTRAVENOUS | Status: AC
  Administered 2013-05-17: 14:00:00 via INTRAVENOUS

## 2013-05-17 MED ORDER — PALONOSETRON HCL INJECTION 0.25 MG/5ML
INTRAVENOUS | Status: AC
  Filled 2013-05-17: qty 5

## 2013-05-17 MED ORDER — DEXAMETHASONE SODIUM PHOSPHATE 20 MG/5ML IJ SOLN
INTRAMUSCULAR | Status: AC
  Filled 2013-05-17: qty 5

## 2013-05-17 MED ORDER — HEPARIN SOD (PORK) LOCK FLUSH 100 UNIT/ML IV SOLN
500.0000 [IU] | Freq: Once | INTRAVENOUS | Status: AC | PRN
  Administered 2013-05-17: 500 [IU]
  Filled 2013-05-17: qty 5

## 2013-05-17 MED ORDER — SODIUM CHLORIDE 0.9 % IV SOLN
Freq: Once | INTRAVENOUS | Status: AC
  Administered 2013-05-17: 10:00:00 via INTRAVENOUS

## 2013-05-17 MED ORDER — DEXAMETHASONE SODIUM PHOSPHATE 20 MG/5ML IJ SOLN
12.0000 mg | Freq: Once | INTRAMUSCULAR | Status: AC
  Administered 2013-05-17: 12 mg via INTRAVENOUS

## 2013-05-17 MED ORDER — SODIUM CHLORIDE 0.9 % IV SOLN
150.0000 mg | Freq: Once | INTRAVENOUS | Status: AC
  Administered 2013-05-17: 150 mg via INTRAVENOUS
  Filled 2013-05-17: qty 5

## 2013-05-17 MED ORDER — SODIUM CHLORIDE 0.9 % IV SOLN
25.0000 mg/m2 | Freq: Once | INTRAVENOUS | Status: AC
  Administered 2013-05-17: 40 mg via INTRAVENOUS
  Filled 2013-05-17: qty 40

## 2013-05-17 NOTE — Progress Notes (Signed)
Patient continues to eat soft foods and drink liquids.  She is drinking Ensure Plus and tries to drink 5 daily.  She denies nutrition side effects.  Weight increased to 118 pounds from 116.1 pounds.  No nutrition diagnosis.  Will monitor as needed.

## 2013-05-17 NOTE — Patient Instructions (Signed)
[facility] Discharge Instructions for Patients Receiving Chemotherapy  Today you received the following chemotherapy agents: cisplatin  To help prevent nausea and vomiting after your treatment, we encourage you to take your nausea medication.  Take it as often as prescribed.     If you develop nausea and vomiting that is not controlled by your nausea medication, call the clinic. If it is after clinic hours your family physician or the after hours number for the clinic or go to the Emergency Department.   BELOW ARE SYMPTOMS THAT SHOULD BE REPORTED IMMEDIATELY:  *FEVER GREATER THAN 100.5 F  *CHILLS WITH OR WITHOUT FEVER  NAUSEA AND VOMITING THAT IS NOT CONTROLLED WITH YOUR NAUSEA MEDICATION  *UNUSUAL SHORTNESS OF BREATH  *UNUSUAL BRUISING OR BLEEDING  TENDERNESS IN MOUTH AND THROAT WITH OR WITHOUT PRESENCE OF ULCERS  *URINARY PROBLEMS  *BOWEL PROBLEMS  UNUSUAL RASH Items with * indicate a potential emergency and should be followed up as soon as possible.  Feel free to call the clinic you have any questions or concerns. The clinic phone number is [phone].   I have been informed and understand all the instructions given to me. I know to contact the clinic, my physician, or go to the Emergency Department if any problems should occur. I do not have any questions at this time, but understand that I may call the clinic during office hours   should I have any questions or need assistance in obtaining follow up care.    __________________________________________  _____________  __________ Signature of Patient or Authorized Representative            Date                   Time    __________________________________________ Nurse's Signature    

## 2013-05-19 ENCOUNTER — Ambulatory Visit (HOSPITAL_COMMUNITY)
Admission: RE | Admit: 2013-05-19 | Discharge: 2013-05-19 | Disposition: A | Discharge status: Home / Self Care | Payer: Medicare Other | Source: Ambulatory Visit | Attending: Hematology and Oncology | Admitting: Hematology and Oncology

## 2013-05-19 DIAGNOSIS — C7800 Secondary malignant neoplasm of unspecified lung: Secondary | ICD-10-CM | POA: Insufficient documentation

## 2013-05-19 DIAGNOSIS — C7951 Secondary malignant neoplasm of bone: Secondary | ICD-10-CM | POA: Insufficient documentation

## 2013-05-19 DIAGNOSIS — C030 Malignant neoplasm of upper gum: Secondary | ICD-10-CM | POA: Insufficient documentation

## 2013-05-19 DIAGNOSIS — C7952 Secondary malignant neoplasm of bone marrow: Secondary | ICD-10-CM

## 2013-05-19 LAB — GLUCOSE, CAPILLARY: GLUCOSE-CAPILLARY: 87 mg/dL (ref 70–99)

## 2013-05-19 MED ORDER — FLUDEOXYGLUCOSE F - 18 (FDG) INJECTION
6.9000 | Freq: Once | INTRAVENOUS | Status: AC | PRN
  Administered 2013-05-19: 6.9 via INTRAVENOUS

## 2013-05-19 MED ORDER — TECHNETIUM TC 99M MEBROFENIN IV KIT: 6.9000 | PACK | Freq: Once | INTRAVENOUS | Status: DC | PRN

## 2013-05-23 ENCOUNTER — Telehealth: Payer: Self-pay | Admitting: *Deleted

## 2013-05-23 ENCOUNTER — Other Ambulatory Visit (HOSPITAL_BASED_OUTPATIENT_CLINIC_OR_DEPARTMENT_OTHER): Payer: Medicare Other

## 2013-05-23 ENCOUNTER — Ambulatory Visit (HOSPITAL_BASED_OUTPATIENT_CLINIC_OR_DEPARTMENT_OTHER): Payer: Medicare Other | Admitting: Hematology and Oncology

## 2013-05-23 ENCOUNTER — Encounter: Payer: Self-pay | Admitting: Hematology and Oncology

## 2013-05-23 VITALS — BP 124/66 | HR 98 | Temp 98.6°F | Resp 18 | Ht 68.0 in | Wt 116.4 lb

## 2013-05-23 DIAGNOSIS — C760 Malignant neoplasm of head, face and neck: Secondary | ICD-10-CM

## 2013-05-23 DIAGNOSIS — M545 Low back pain, unspecified: Secondary | ICD-10-CM

## 2013-05-23 DIAGNOSIS — E46 Unspecified protein-calorie malnutrition: Secondary | ICD-10-CM

## 2013-05-23 DIAGNOSIS — D6481 Anemia due to antineoplastic chemotherapy: Secondary | ICD-10-CM

## 2013-05-23 DIAGNOSIS — K121 Other forms of stomatitis: Secondary | ICD-10-CM

## 2013-05-23 DIAGNOSIS — K1230 Oral mucositis (ulcerative), unspecified: Secondary | ICD-10-CM

## 2013-05-23 DIAGNOSIS — C030 Malignant neoplasm of upper gum: Secondary | ICD-10-CM

## 2013-05-23 DIAGNOSIS — T451X5A Adverse effect of antineoplastic and immunosuppressive drugs, initial encounter: Secondary | ICD-10-CM

## 2013-05-23 DIAGNOSIS — E440 Moderate protein-calorie malnutrition: Secondary | ICD-10-CM

## 2013-05-23 DIAGNOSIS — Z931 Gastrostomy status: Secondary | ICD-10-CM

## 2013-05-23 DIAGNOSIS — D630 Anemia in neoplastic disease: Secondary | ICD-10-CM

## 2013-05-23 LAB — COMPREHENSIVE METABOLIC PANEL (CC13)
ALK PHOS: 97 U/L (ref 40–150)
ALT: 8 U/L (ref 0–55)
AST: 14 U/L (ref 5–34)
Albumin: 3.2 g/dL — ABNORMAL LOW (ref 3.5–5.0)
Anion Gap: 10 mEq/L (ref 3–11)
BILIRUBIN TOTAL: 0.33 mg/dL (ref 0.20–1.20)
BUN: 24.7 mg/dL (ref 7.0–26.0)
CO2: 24 mEq/L (ref 22–29)
Calcium: 9.7 mg/dL (ref 8.4–10.4)
Chloride: 103 mEq/L (ref 98–109)
Creatinine: 1.1 mg/dL (ref 0.6–1.1)
Glucose: 122 mg/dl (ref 70–140)
Potassium: 4.7 mEq/L (ref 3.5–5.1)
SODIUM: 138 meq/L (ref 136–145)
TOTAL PROTEIN: 7 g/dL (ref 6.4–8.3)

## 2013-05-23 LAB — CBC WITH DIFFERENTIAL/PLATELET
BASO%: 0.3 % (ref 0.0–2.0)
Basophils Absolute: 0 10*3/uL (ref 0.0–0.1)
EOS%: 0.1 % (ref 0.0–7.0)
Eosinophils Absolute: 0 10*3/uL (ref 0.0–0.5)
HCT: 25.7 % — ABNORMAL LOW (ref 34.8–46.6)
HGB: 8.2 g/dL — ABNORMAL LOW (ref 11.6–15.9)
LYMPH%: 13.5 % — AB (ref 14.0–49.7)
MCH: 26.1 pg (ref 25.1–34.0)
MCHC: 31.9 g/dL (ref 31.5–36.0)
MCV: 81.8 fL (ref 79.5–101.0)
MONO#: 0.5 10*3/uL (ref 0.1–0.9)
MONO%: 10 % (ref 0.0–14.0)
NEUT#: 3.5 10*3/uL (ref 1.5–6.5)
NEUT%: 76.1 % (ref 38.4–76.8)
PLATELETS: 225 10*3/uL (ref 145–400)
RBC: 3.14 10*6/uL — AB (ref 3.70–5.45)
RDW: 17.5 % — ABNORMAL HIGH (ref 11.2–14.5)
WBC: 4.6 10*3/uL (ref 3.9–10.3)
lymph#: 0.6 10*3/uL — ABNORMAL LOW (ref 0.9–3.3)

## 2013-05-23 LAB — HOLD TUBE, BLOOD BANK

## 2013-05-23 MED ORDER — MORPHINE SULFATE (CONCENTRATE) 20 MG/ML PO SOLN: 10.0000 mg | ORAL | Status: AC | PRN

## 2013-05-23 NOTE — Telephone Encounter (Signed)
Received call from [facility] at [place].  They contacted pt for admission visit and pt declined Hospice visit at this time.  Pt told them she does not need Hospice services at this time.

## 2013-05-23 NOTE — Telephone Encounter (Signed)
Referral made to [facility] for [facility]. Pt is stopping treatment.  S/w Cathy at [facility].

## 2013-05-23 NOTE — Progress Notes (Signed)
[place] OFFICE PROGRESS NOTE  Patient Care Team: Gayland Curry, DO as PCP - General (Geriatric Medicine) Brooks Sailors, RN as Registered Nurse (Oncology)  DIAGNOSIS: Metastatic head and neck cancer  SUMMARY OF ONCOLOGIC HISTORY: 1) She had background history of breast cancer on the right found on diagnostic mammogram in 2011.She is previous patient of Dr.Magrinat. The patient underwent right mastectomy and sentinel lymph node biopsy which showed a T1 CN 0M0 ER/PR positive HER-2/neu negative invasive ductal carcinoma. She was treated with adjuvant endocrine therapy with letrozole. 2) In January 2014, she complained of some jaw pain throat pain and an enlarged cervical lymph node. She saw Dr. Erik Obey who did a biopsy of the right maxillary alveolus which came back positive for invasive squamous cell carcinoma. CT scan show possible regional lymph node involvement. She went to [facility] and had surgery.  3) On 03/18/2012 she underwent maxillectomy, right neck dissection, and flap reconstruction at [facility]. Unfortunately she had positive margins as well as regional lymph node involvement. There were presence of lymphovascular invasion and perineural invasion. The size of the tumor was 5 cm in greatest dimension final staging was T4, N1, M0. 4) She returned to [facility] for further evaluation after surgery. The patient has canceled her appointments to See a Medical Oncologist Many Times. Ultimately, she underwent adjuvant radiation therapy only. 5) In September 2014, repeat PET/CT scan showed evidence of metastatic disease to the lungs. She's been referred here for further management. The patient received cycle 1 of chemotherapy last week on 11/09/2012 Her treatment was complicated by severe anemia. She received blood transfusion on 11/19/2012 & 12/21/2012 6) 11/21/2014repeat PET CT scan show positive response to treatment 7) 01/03/2013 each cycle of chemo is  modified to 2 weeks on 1 week off 8) 02/15/13 each cycle of chemotherapy is modified to 1 weeks on, 1 week off with 20% dose adjustment to to leukopenia, anemia, and mucositis 9) 03/10/2013 PET CT scan showed disease progression. The patient decided to undergo further chemotherapy   10) 03/22/13: The patient was started on weekly cisplatin at 30 mg per meter square 11) 05/19/13: Repeat PET CT scan showed disease progression. Chemotherapy is discontinue and the patient is referred to hospice.  INTERVAL HISTORY: Kristine Morales 80 y.o. female returns for further followup. She complained of fatigue. She said her persistent dry lips and difficulties with swallowing. She denies any pain apart from some lower back pain. Her current prescription pain medicine is not helping. She has mild weight loss.  I have reviewed the past medical history, past surgical history, social history and family history with the patient and they are unchanged from previous note.  ALLERGIES:  has No Known Allergies.  MEDICATIONS:  Current Outpatient Prescriptions  Medication Sig Dispense Refill  . ALPHAGAN P 0.1 % SOLN Place 1 drop into both eyes 2 (two) times daily.       . dorzolamide-timolol (COSOPT) 22.3-6.8 MG/ML ophthalmic solution Place 1 drop into both eyes 2 (two) times daily.       Marland Kitchen lidocaine-prilocaine (EMLA) cream Apply topically as needed. Apply to [facility] a Cath site at least one hour prior to needle stick  30 g  1  . LORazepam (ATIVAN) 0.5 MG tablet Take 1 tablet (0.5 mg total) by mouth every 6 (six) hours as needed (nausea and/or vomiting.).  30 tablet  0  . Nutritional Supplements (FEEDING SUPPLEMENT, OSMOLITE 1.2 CAL,) LIQD Begin 360 mL Osmolite 1.2 QID with 60  mL free water before and after each bolus feeding.  1440 mL  0  . oxyCODONE-acetaminophen (PERCOCET/ROXICET) 5-325 MG per tablet Take 1 tablet by mouth every 8 (eight) hours as needed for severe pain.  30 tablet  0  . sodium fluoride (FLUORISHIELD) 1.1  % GEL dental gel Place 1 application onto teeth at bedtime.      . sodium polystyrene (KAYEXALATE) 15 GM/60ML suspension Take 60 mLs (15 g total) by mouth daily.  500 mL  0  . White Petrolatum-Mineral Oil (RETAINE PM) OINT Apply to thigh daily      . zolpidem (AMBIEN) 5 MG tablet Take 1 tablet (5 mg total) by mouth at bedtime as needed for sleep.  30 tablet  1  . [DISCONTINUED] prochlorperazine (COMPAZINE) 10 MG tablet Take 1 tablet (10 mg total) by mouth every 6 (six) hours as needed (Nausea or vomiting).  30 tablet  1  . morphine (ROXANOL) 20 MG/ML concentrated solution Take 0.5 mLs (10 mg total) by mouth every 2 (two) hours as needed for moderate pain or severe pain.  120 mL  0   No current facility-administered medications for this visit.    REVIEW OF SYSTEMS:   Constitutional: Denies fevers, chills  Eyes: Denies blurriness of vision Respiratory: Denies cough, dyspnea or wheezes Cardiovascular: Denies palpitation, chest discomfort or lower extremity swelling Gastrointestinal:  Denies nausea, heartburn or change in bowel habits Skin: Denies abnormal skin rashes Lymphatics: Denies new lymphadenopathy or easy bruising Neurological:Denies numbness, tingling or new weaknesses Behavioral/Psych: Mood is stable, no new changes  All other systems were reviewed with the patient and are negative.  PHYSICAL EXAMINATION: ECOG PERFORMANCE STATUS: 2 - Symptomatic, <50% confined to bed  Filed Vitals:   05/23/13 1306  BP: 124/66  Pulse: 98  Temp: 98.6 F (37 C)  Resp: 18   Filed Weights   05/23/13 1306  Weight: 116 lb 6.4 oz (52.799 kg)    GENERAL:alert, no distress and comfortable. She appears thin and cachectic SKIN: skin color, texture, turgor are normal, no rashes or significant lesions EYES: normal, Conjunctiva are pale and non-injected, sclera clear OROPHARYNX: Dry mucous membranes with mild mucositis is noted no thrush Musculoskeletal:no cyanosis of digits and no clubbing  NEURO:  alert & oriented x 3 with fluent speech, no focal motor/sensory deficits  LABORATORY DATA:  I have reviewed the data as listed    Component Value Date/Time   NA 137 05/17/2013 0845   NA 138 09/07/2012 1950   K 5.1 05/17/2013 0845   K 3.6 09/07/2012 1950   CL 103 09/07/2012 1950   CL 104 02/09/2012 0919   CO2 22 05/17/2013 0845   CO2 25 09/07/2012 1950   GLUCOSE 144* 05/17/2013 0845   GLUCOSE 99 09/07/2012 1950   GLUCOSE 142* 02/09/2012 0919   BUN 24.3 05/17/2013 0845   BUN 25* 09/07/2012 1950   CREATININE 1.3* 05/17/2013 0845   CREATININE 1.09 09/07/2012 1950   CALCIUM 9.9 05/17/2013 0845   CALCIUM 9.7 09/07/2012 1950   PROT 7.1 05/17/2013 0845   PROT 7.2 09/07/2012 1950   ALBUMIN 3.3* 05/17/2013 0845   ALBUMIN 3.6 09/07/2012 1950   AST 13 05/17/2013 0845   AST 17 09/07/2012 1950   ALT 7 05/17/2013 0845   ALT 13 09/07/2012 1950   ALKPHOS 111 05/17/2013 0845   ALKPHOS 75 09/07/2012 1950   BILITOT 0.27 05/17/2013 0845   BILITOT 0.3 09/07/2012 1950   GFRNONAA 47* 09/07/2012 1950   GFRAA 54* 09/07/2012 1950  No results found for this basename: SPEP,  UPEP,   kappa and lambda light chains    Lab Results  Component Value Date   WBC 4.6 05/23/2013   NEUTROABS 3.5 05/23/2013   HGB 8.2* 05/23/2013   HCT 25.7* 05/23/2013   MCV 81.8 05/23/2013   PLT 225 05/23/2013      Chemistry      Component Value Date/Time   NA 137 05/17/2013 0845   NA 138 09/07/2012 1950   K 5.1 05/17/2013 0845   K 3.6 09/07/2012 1950   CL 103 09/07/2012 1950   CL 104 02/09/2012 0919   CO2 22 05/17/2013 0845   CO2 25 09/07/2012 1950   BUN 24.3 05/17/2013 0845   BUN 25* 09/07/2012 1950   CREATININE 1.3* 05/17/2013 0845   CREATININE 1.09 09/07/2012 1950      Component Value Date/Time   CALCIUM 9.9 05/17/2013 0845   CALCIUM 9.7 09/07/2012 1950   ALKPHOS 111 05/17/2013 0845   ALKPHOS 75 09/07/2012 1950   AST 13 05/17/2013 0845   AST 17 09/07/2012 1950   ALT 7 05/17/2013 0845   ALT 13 09/07/2012 1950   BILITOT 0.27 05/17/2013 0845   BILITOT 0.3  09/07/2012 1950     RADIOGRAPHIC STUDIES: I reviewed the PET scan which showed disease progression I have personally reviewed the radiological images as listed and agreed with the findings in the report.  ASSESSMENT & PLAN:  #1 metastatic squamous cell carcinoma of the head and neck Unfortunately, the patient has progressed on multiple lines of treatment. Any further treatment is unlikely to bring meaningful benefit. I discussed with the patient the role of palliative care and hospice and she agreed not to pursue further palliative treatment. I will refer her to hospice. #2 severe anemia This is due to chemotherapy. The patient does not require transfusion today. #3 malnutrition I recommend she continue to use a feeding tube as needed #4 mild mucositis Continue conservative management. #5 metastatic disease to the bone #6 back pain I gave her a prescription of liquid morphine concentrate for pain control. I recommend hospice to followup on pain control in the long-term.  All questions were answered. The patient knows to call the clinic with any problems, questions or concerns. No barriers to learning was detected. I spent 25 minutes counseling the patient face to face. The total time spent in the appointment was 30 minutes and more than 50% was on counseling and review of test results     Heath Lark, MD 05/23/2013 1:22 PM

## 2013-06-09 ENCOUNTER — Ambulatory Visit: Payer: Self-pay | Admitting: Internal Medicine

## 2013-06-09 DIAGNOSIS — Z0289 Encounter for other administrative examinations: Secondary | ICD-10-CM

## 2013-06-26 ENCOUNTER — Telehealth: Payer: Self-pay | Admitting: *Deleted

## 2013-06-26 NOTE — Telephone Encounter (Signed)
Hospice called to say patient is now ready for Hospice to come in to help.

## 2013-06-29 ENCOUNTER — Telehealth: Payer: Self-pay | Admitting: *Deleted

## 2013-06-29 NOTE — Telephone Encounter (Signed)
VM from Nurse w/ [facility].  Pt was admitted to [place] today.  States no urgent needs.  [facility] phone [phone].

## 2013-07-14 ENCOUNTER — Telehealth: Payer: Self-pay | Admitting: *Deleted

## 2013-07-14 NOTE — Telephone Encounter (Signed)
Received call from Sudie Grumbling from [facility] [place] md re:  Pt passed away on 2013/07/15  At  0950 am.   Dr. Alvy Bimler notified. [facility]  Phone    [phone].

## 2013-07-19 DEATH — deceased

## 2013-11-20 ENCOUNTER — Encounter: Payer: Self-pay | Admitting: Hematology and Oncology

## 2015-01-19 IMAGING — CT NM PET TUM IMG RESTAG (PS) SKULL BASE T - THIGH
1 of 8 series · 1 of 25 positions shown · non-contrast
Comparison: PET-CT 03/10/2013, 12/10/1998 14

CLINICAL DATA: Subsequent treatment strategy for head neck
carcinoma. Metastatic axillary carcinoma.

EXAM:
NUCLEAR MEDICINE PET SKULL BASE TO THIGH
TECHNIQUE: 6.9 mCi F-18 FDG was injected intravenously. Full-ring PET imaging
was performed from the skull base to thigh after the radiotracer. CT
data was obtained and used for attenuation correction and anatomic
localization.
FASTING BLOOD GLUCOSE:  Value: 87 mg/dl

[Series 4: ct [id number]_sk_th 5.0 b31f · axial · 5.0mm · 0.98mm/px · 1 of 201 slices shown]
[im 201/201  brain]
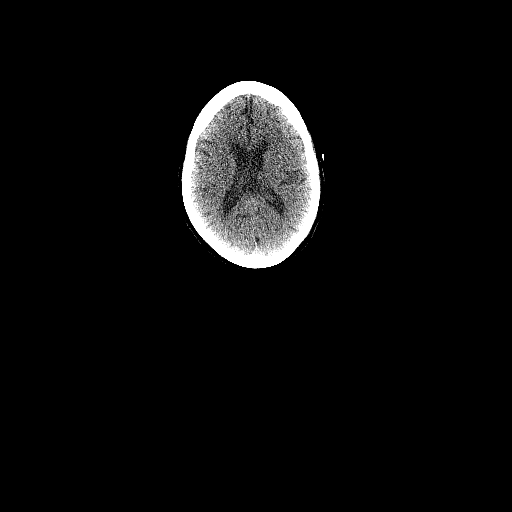

[1 of 25 positions shown; findings below may reference images not displayed]

FINDINGS: NECK

No hypermetabolic lymph nodes in the neck. There is moderate
metabolic activity within the soft tissue medial to the left
mandible at the level angle of the jaw is decreased in metabolic
activity compared to prior (image 31).

CHEST

Interval enlargement and increased in metabolic activity of
bilateral pulmonary nodules. 26 mm left upper lobe nodule (image 56)
is increased from 16 mm on prior. There is adjacent 22 mm nodule
which is new from prior. These nodules are hypermetabolic with SUV
max = 15.0. Hypermetabolic nodule in the medial right upper lobe
adjacent to the anterior mediastinum measures 22 mm increased from
12 mm with max 15.6. Right lower lobe pulmonary nodule measures 26
mm increased from 20 mm with SUV max equal 19 increased from SUV max
equal 10.5.

ABDOMEN/PELVIS

No abnormal hypermetabolic activity within the liver, pancreas,
adrenal glands, or spleen. No hypermetabolic lymph nodes in the
abdomen or pelvis. Physiologic activity associated with the
percutaneous gastrostomy tube.

SKELETON

There is a new hypermetabolic skeletal metastasis within the medial
aspect of the left twelfth rib with a subtle cortical lucency on the
CT image 100 and intense metabolic activity with SUV max equal 11.8.
There is new hypermetabolic lesion at L3 a with a lytic lesion on
the CT portion measuring 10 mm (image 121, series 4) with SUV max =
12.4.
IMPRESSION: 1. Unfortunately there is continued progression of pulmonary
metastasis with new pulmonary nodules, enlarged pulmonary nodules
and increased metabolic activity of pulmonary nodules.
2. New skeletal metastasis in the posterior medial twelfth rib and
the L3 vertebral body.
3. Uptake medial to the left mandible is felt to be post therapy
inflammation.
